# Patient Record
Sex: Male | Born: 1945 | Race: White | Hispanic: No | Marital: Married | Smoking: Current every day smoker
Health system: Southern US, Community
[De-identification: ages and names within clinical notes are randomized; demographics above are authoritative.]

## PROBLEM LIST (undated history)

## (undated) DIAGNOSIS — Z95810 Presence of automatic (implantable) cardiac defibrillator: Secondary | ICD-10-CM

## (undated) DIAGNOSIS — I219 Acute myocardial infarction, unspecified: Secondary | ICD-10-CM

## (undated) DIAGNOSIS — E119 Type 2 diabetes mellitus without complications: Secondary | ICD-10-CM

## (undated) DIAGNOSIS — R053 Chronic cough: Secondary | ICD-10-CM

## (undated) DIAGNOSIS — K863 Pseudocyst of pancreas: Secondary | ICD-10-CM

## (undated) DIAGNOSIS — J449 Chronic obstructive pulmonary disease, unspecified: Secondary | ICD-10-CM

## (undated) DIAGNOSIS — I2510 Atherosclerotic heart disease of native coronary artery without angina pectoris: Secondary | ICD-10-CM

## (undated) DIAGNOSIS — I10 Essential (primary) hypertension: Secondary | ICD-10-CM

## (undated) DIAGNOSIS — E871 Hypo-osmolality and hyponatremia: Secondary | ICD-10-CM

## (undated) DIAGNOSIS — M7989 Other specified soft tissue disorders: Secondary | ICD-10-CM

## (undated) DIAGNOSIS — J189 Pneumonia, unspecified organism: Secondary | ICD-10-CM

## (undated) DIAGNOSIS — I509 Heart failure, unspecified: Secondary | ICD-10-CM

## (undated) DIAGNOSIS — I499 Cardiac arrhythmia, unspecified: Secondary | ICD-10-CM

## (undated) DIAGNOSIS — N189 Chronic kidney disease, unspecified: Secondary | ICD-10-CM

## (undated) DIAGNOSIS — Z950 Presence of cardiac pacemaker: Secondary | ICD-10-CM

## (undated) DIAGNOSIS — I209 Angina pectoris, unspecified: Secondary | ICD-10-CM

## (undated) DIAGNOSIS — E7800 Pure hypercholesterolemia, unspecified: Secondary | ICD-10-CM

## (undated) DIAGNOSIS — K219 Gastro-esophageal reflux disease without esophagitis: Secondary | ICD-10-CM

## (undated) DIAGNOSIS — R05 Cough: Secondary | ICD-10-CM

## (undated) DIAGNOSIS — K759 Inflammatory liver disease, unspecified: Secondary | ICD-10-CM

## (undated) HISTORY — PX: CORONARY ARTERY BYPASS GRAFT: SHX141

## (undated) HISTORY — PX: STENT PLACEMENT NON-VASCULAR (ARMC HX): HXRAD1736

## (undated) HISTORY — PX: CARDIAC CATHETERIZATION: SHX172

## (undated) HISTORY — PX: ERCP W/ SPHICTEROTOMY: SHX1523

---

## 1991-08-16 DIAGNOSIS — I219 Acute myocardial infarction, unspecified: Secondary | ICD-10-CM

## 1991-08-16 HISTORY — DX: Acute myocardial infarction, unspecified: I21.9

## 2005-07-31 ENCOUNTER — Emergency Department: Payer: Self-pay | Admitting: Emergency Medicine

## 2005-08-03 ENCOUNTER — Other Ambulatory Visit: Payer: Self-pay

## 2005-08-03 ENCOUNTER — Ambulatory Visit: Payer: Self-pay | Admitting: Emergency Medicine

## 2005-08-03 ENCOUNTER — Emergency Department: Payer: Self-pay | Admitting: Internal Medicine

## 2006-02-28 ENCOUNTER — Inpatient Hospital Stay: Payer: Self-pay | Admitting: Cardiology

## 2006-02-28 ENCOUNTER — Other Ambulatory Visit: Payer: Self-pay

## 2006-03-01 ENCOUNTER — Other Ambulatory Visit: Payer: Self-pay

## 2006-03-02 ENCOUNTER — Other Ambulatory Visit: Payer: Self-pay

## 2006-03-28 ENCOUNTER — Ambulatory Visit: Payer: Self-pay | Admitting: Family Medicine

## 2006-08-29 ENCOUNTER — Emergency Department: Payer: Self-pay | Admitting: Unknown Physician Specialty

## 2008-03-24 IMAGING — CR DG CHEST 1V PORT
1 series · 1 of 1 positions shown · non-contrast
Comparison: none

REASON FOR EXAM: chest pain
COMMENTS:

[view not recorded]
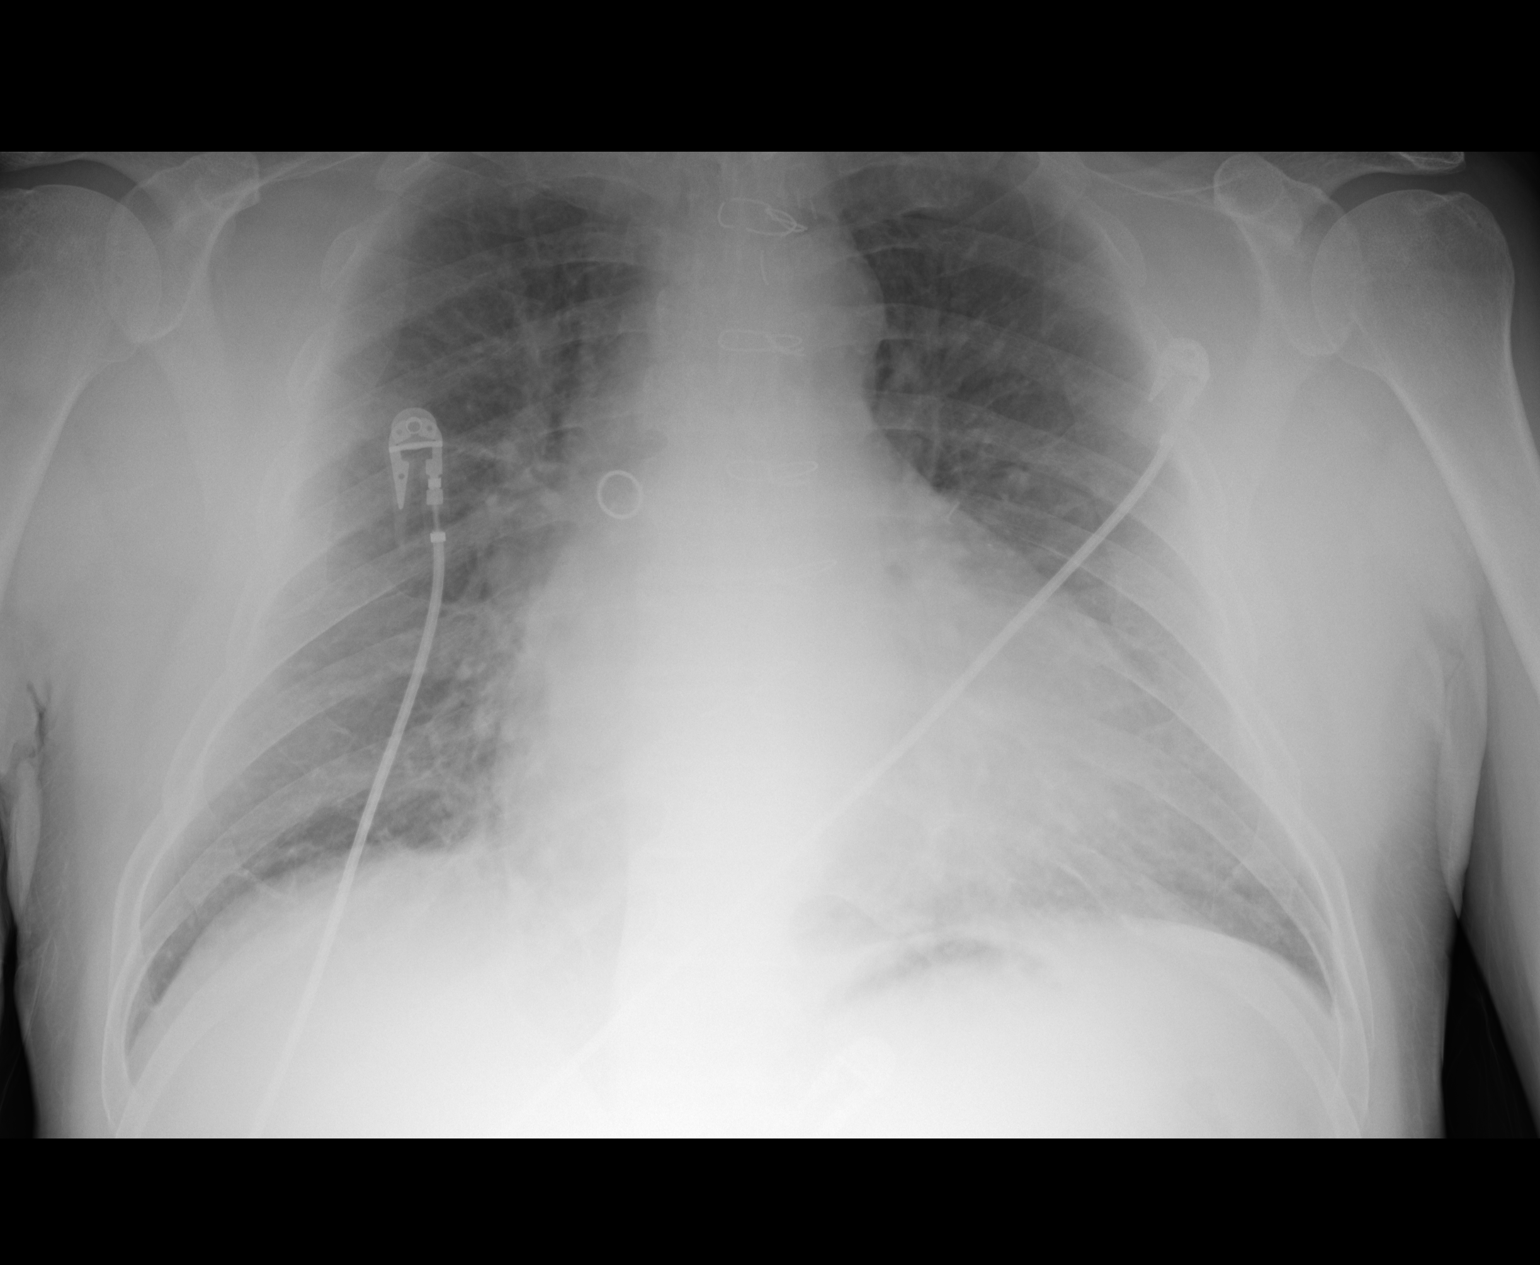

[1 of 1 positions shown; findings below may reference images not displayed]

PROCEDURE:     DXR - DXR PORTABLE CHEST SINGLE VIEW  - February 28, 2006  [DATE]

RESULT:     Comparison is made to prior study dated 08-01-05.

The patient has taken a shallow inspiration.  There is thickening and
indistinctness of the interstitial markings as well as peribronchial
cuffing. No focal regions of consolidation are identified. The cardiac
silhouette is enlarged.  The patient is status post median sternotomy and
coronary artery bypass grafting. The visualized bony skeleton is
unremarkable.
IMPRESSION: 1)Cardiomegaly.

2)Findings consistent with pulmonary edema.

## 2008-08-15 DIAGNOSIS — Z95810 Presence of automatic (implantable) cardiac defibrillator: Secondary | ICD-10-CM

## 2008-08-15 HISTORY — PX: CARDIAC DEFIBRILLATOR PLACEMENT: SHX171

## 2008-08-15 HISTORY — DX: Presence of automatic (implantable) cardiac defibrillator: Z95.810

## 2011-12-20 ENCOUNTER — Inpatient Hospital Stay: Payer: Self-pay | Admitting: Internal Medicine

## 2011-12-20 LAB — CBC
HGB: 13.7 g/dL (ref 13.0–18.0)
MCH: 30.5 pg (ref 26.0–34.0)
MCV: 88 fL (ref 80–100)
Platelet: 223 10*3/uL (ref 150–440)
RBC: 4.51 10*6/uL (ref 4.40–5.90)

## 2011-12-20 LAB — COMPREHENSIVE METABOLIC PANEL
Albumin: 3.9 g/dL (ref 3.4–5.0)
Anion Gap: 10 (ref 7–16)
BUN: 20 mg/dL — ABNORMAL HIGH (ref 7–18)
Bilirubin,Total: 0.3 mg/dL (ref 0.2–1.0)
Calcium, Total: 9 mg/dL (ref 8.5–10.1)
Chloride: 106 mmol/L (ref 98–107)
Creatinine: 1.58 mg/dL — ABNORMAL HIGH (ref 0.60–1.30)
EGFR (African American): 52 — ABNORMAL LOW
Osmolality: 288 (ref 275–301)
Potassium: 4.3 mmol/L (ref 3.5–5.1)
SGOT(AST): 18 U/L (ref 15–37)
Total Protein: 7.6 g/dL (ref 6.4–8.2)

## 2011-12-20 LAB — APTT: Activated PTT: 30.7 secs (ref 23.6–35.9)

## 2011-12-20 LAB — TROPONIN I: Troponin-I: 0.08 ng/mL — ABNORMAL HIGH

## 2011-12-21 LAB — MAGNESIUM: Magnesium: 0.9 mg/dL — ABNORMAL LOW

## 2012-03-25 ENCOUNTER — Emergency Department: Payer: Self-pay | Admitting: Emergency Medicine

## 2012-08-15 HISTORY — PX: OTHER SURGICAL HISTORY: SHX169

## 2012-08-15 HISTORY — PX: PANCREATIC CYST EXCISION: SHX2157

## 2012-08-15 HISTORY — PX: GASTROSTOMY W/ FEEDING TUBE: SUR642

## 2012-08-15 HISTORY — PX: CHOLECYSTECTOMY: SHX55

## 2012-11-14 ENCOUNTER — Ambulatory Visit: Payer: Self-pay | Admitting: Family Medicine

## 2013-01-02 ENCOUNTER — Ambulatory Visit: Payer: Self-pay | Admitting: Family Medicine

## 2013-01-07 ENCOUNTER — Inpatient Hospital Stay: Payer: Self-pay | Admitting: Internal Medicine

## 2013-01-07 LAB — COMPREHENSIVE METABOLIC PANEL
Albumin: 4 g/dL (ref 3.4–5.0)
BUN: 13 mg/dL (ref 7–18)
Chloride: 105 mmol/L (ref 98–107)
Co2: 22 mmol/L (ref 21–32)
Creatinine: 1.54 mg/dL — ABNORMAL HIGH (ref 0.60–1.30)
EGFR (African American): 53 — ABNORMAL LOW
Potassium: 3.6 mmol/L (ref 3.5–5.1)
SGPT (ALT): 134 U/L — ABNORMAL HIGH (ref 12–78)
Sodium: 136 mmol/L (ref 136–145)
Total Protein: 8.8 g/dL — ABNORMAL HIGH (ref 6.4–8.2)

## 2013-01-07 LAB — BILIRUBIN, DIRECT: Bilirubin, Direct: 10.9 mg/dL — ABNORMAL HIGH (ref 0.00–0.20)

## 2013-01-07 LAB — CBC WITH DIFFERENTIAL/PLATELET
HCT: 41.1 % (ref 40.0–52.0)
Lymphocytes: 28 %
MCH: 29.2 pg (ref 26.0–34.0)
MCV: 88 fL (ref 80–100)
Platelet: 230 10*3/uL (ref 150–440)
RDW: 17.1 % — ABNORMAL HIGH (ref 11.5–14.5)
WBC: 8.1 10*3/uL (ref 3.8–10.6)

## 2013-01-07 LAB — URINALYSIS, COMPLETE
Blood: NEGATIVE
Ph: 5 (ref 4.5–8.0)
Protein: NEGATIVE
RBC,UR: 1 /HPF (ref 0–5)
Specific Gravity: 1.011 (ref 1.003–1.030)
Squamous Epithelial: <1
WBC UR: 1 /HPF (ref 0–5)

## 2013-01-08 LAB — CBC WITH DIFFERENTIAL/PLATELET
Comment - H1-Com1: NORMAL
Comment - H1-Com2: NORMAL
Eosinophil: 7 %
HCT: 35.4 % — ABNORMAL LOW (ref 40.0–52.0)
HGB: 12.3 g/dL — ABNORMAL LOW (ref 13.0–18.0)
Lymphocytes: 22 %
MCH: 29.9 pg (ref 26.0–34.0)
MCV: 86 fL (ref 80–100)
Platelet: 198 10*3/uL (ref 150–440)
RBC: 4.11 10*6/uL — ABNORMAL LOW (ref 4.40–5.90)
Segmented Neutrophils: 58 %

## 2013-01-08 LAB — HEPATIC FUNCTION PANEL A (ARMC)
Albumin: 3 g/dL — ABNORMAL LOW (ref 3.4–5.0)
Alkaline Phosphatase: 705 U/L — ABNORMAL HIGH (ref 50–136)
Bilirubin, Direct: 10.2 mg/dL — ABNORMAL HIGH (ref 0.00–0.20)
Bilirubin,Total: 13 mg/dL — ABNORMAL HIGH (ref 0.2–1.0)
SGPT (ALT): 92 U/L — ABNORMAL HIGH (ref 12–78)
Total Protein: 6.8 g/dL (ref 6.4–8.2)

## 2013-01-08 LAB — BASIC METABOLIC PANEL
Anion Gap: 11 (ref 7–16)
Calcium, Total: 9.2 mg/dL (ref 8.5–10.1)
Chloride: 107 mmol/L (ref 98–107)
Co2: 21 mmol/L (ref 21–32)
EGFR (African American): 48 — ABNORMAL LOW
EGFR (Non-African Amer.): 41 — ABNORMAL LOW
Sodium: 139 mmol/L (ref 136–145)

## 2013-01-09 LAB — HEPATIC FUNCTION PANEL A (ARMC)
Alkaline Phosphatase: 674 U/L — ABNORMAL HIGH (ref 50–136)
Bilirubin, Direct: 10 mg/dL — ABNORMAL HIGH (ref 0.00–0.20)
Bilirubin,Total: 12.8 mg/dL — ABNORMAL HIGH (ref 0.2–1.0)
SGOT(AST): 67 U/L — ABNORMAL HIGH (ref 15–37)

## 2013-01-09 LAB — IRON AND TIBC
Iron Bind.Cap.(Total): 316 ug/dL (ref 250–450)
Iron: 134 ug/dL (ref 65–175)

## 2013-01-10 LAB — COMPREHENSIVE METABOLIC PANEL
Anion Gap: 7 (ref 7–16)
Bilirubin,Total: 12.4 mg/dL — ABNORMAL HIGH (ref 0.2–1.0)
Calcium, Total: 9.2 mg/dL (ref 8.5–10.1)
Creatinine: 1.44 mg/dL — ABNORMAL HIGH (ref 0.60–1.30)
Osmolality: 278 (ref 275–301)
Potassium: 3.8 mmol/L (ref 3.5–5.1)
SGOT(AST): 65 U/L — ABNORMAL HIGH (ref 15–37)
Sodium: 135 mmol/L — ABNORMAL LOW (ref 136–145)
Total Protein: 6.7 g/dL (ref 6.4–8.2)

## 2013-01-11 LAB — COMPREHENSIVE METABOLIC PANEL
Alkaline Phosphatase: 637 U/L — ABNORMAL HIGH (ref 50–136)
Anion Gap: 7 (ref 7–16)
Chloride: 105 mmol/L (ref 98–107)
EGFR (African American): 54 — ABNORMAL LOW
Glucose: 162 mg/dL — ABNORMAL HIGH (ref 65–99)
Osmolality: 277 (ref 275–301)
Potassium: 3.9 mmol/L (ref 3.5–5.1)
SGPT (ALT): 72 U/L (ref 12–78)
Sodium: 134 mmol/L — ABNORMAL LOW (ref 136–145)
Total Protein: 7.3 g/dL (ref 6.4–8.2)

## 2013-01-12 LAB — COMPREHENSIVE METABOLIC PANEL
Alkaline Phosphatase: 698 U/L — ABNORMAL HIGH (ref 50–136)
BUN: 32 mg/dL — ABNORMAL HIGH (ref 7–18)
Chloride: 101 mmol/L (ref 98–107)
Glucose: 160 mg/dL — ABNORMAL HIGH (ref 65–99)
Osmolality: 277 (ref 275–301)
Potassium: 4 mmol/L (ref 3.5–5.1)
SGOT(AST): 72 U/L — ABNORMAL HIGH (ref 15–37)
Total Protein: 7.3 g/dL (ref 6.4–8.2)

## 2013-01-13 LAB — COMPREHENSIVE METABOLIC PANEL
Albumin: 3 g/dL — ABNORMAL LOW (ref 3.4–5.0)
Alkaline Phosphatase: 629 U/L — ABNORMAL HIGH (ref 50–136)
Anion Gap: 9 (ref 7–16)
BUN: 33 mg/dL — ABNORMAL HIGH (ref 7–18)
Bilirubin,Total: 14.5 mg/dL — ABNORMAL HIGH (ref 0.2–1.0)
Co2: 19 mmol/L — ABNORMAL LOW (ref 21–32)
Creatinine: 1.5 mg/dL — ABNORMAL HIGH (ref 0.60–1.30)
EGFR (Non-African Amer.): 47 — ABNORMAL LOW
Glucose: 142 mg/dL — ABNORMAL HIGH (ref 65–99)
Potassium: 4.2 mmol/L (ref 3.5–5.1)
SGOT(AST): 72 U/L — ABNORMAL HIGH (ref 15–37)

## 2013-01-13 LAB — CBC WITH DIFFERENTIAL/PLATELET
Basophil: 3 %
HCT: 36.2 % — ABNORMAL LOW (ref 40.0–52.0)
HGB: 12.5 g/dL — ABNORMAL LOW (ref 13.0–18.0)
MCH: 29.6 pg (ref 26.0–34.0)
MCHC: 34.5 g/dL (ref 32.0–36.0)
Monocytes: 9 %
Platelet: 232 10*3/uL (ref 150–440)
RBC: 4.21 10*6/uL — ABNORMAL LOW (ref 4.40–5.90)
RDW: 18.2 % — ABNORMAL HIGH (ref 11.5–14.5)
Segmented Neutrophils: 61 %

## 2013-01-15 LAB — COMPREHENSIVE METABOLIC PANEL
Albumin: 3.1 g/dL — ABNORMAL LOW (ref 3.4–5.0)
Alkaline Phosphatase: 671 U/L — ABNORMAL HIGH (ref 50–136)
Anion Gap: 9 (ref 7–16)
Bilirubin,Total: 18 mg/dL — ABNORMAL HIGH (ref 0.2–1.0)
Calcium, Total: 9.6 mg/dL (ref 8.5–10.1)
Chloride: 103 mmol/L (ref 98–107)
EGFR (African American): 47 — ABNORMAL LOW
Glucose: 255 mg/dL — ABNORMAL HIGH (ref 65–99)
Sodium: 133 mmol/L — ABNORMAL LOW (ref 136–145)
Total Protein: 7.3 g/dL (ref 6.4–8.2)

## 2013-01-15 LAB — BASIC METABOLIC PANEL
Anion Gap: 8 (ref 7–16)
Calcium, Total: 9.4 mg/dL (ref 8.5–10.1)
Chloride: 105 mmol/L (ref 98–107)
Creatinine: 1.45 mg/dL — ABNORMAL HIGH (ref 0.60–1.30)
EGFR (African American): 57 — ABNORMAL LOW
EGFR (Non-African Amer.): 49 — ABNORMAL LOW
Glucose: 266 mg/dL — ABNORMAL HIGH (ref 65–99)
Osmolality: 286 (ref 275–301)
Potassium: 4.1 mmol/L (ref 3.5–5.1)
Sodium: 135 mmol/L — ABNORMAL LOW (ref 136–145)

## 2013-01-15 LAB — HEMOGLOBIN: HGB: 12.9 g/dL — ABNORMAL LOW (ref 13.0–18.0)

## 2013-01-16 LAB — COMPREHENSIVE METABOLIC PANEL
Alkaline Phosphatase: 545 U/L — ABNORMAL HIGH (ref 50–136)
BUN: 36 mg/dL — ABNORMAL HIGH (ref 7–18)
Calcium, Total: 9.3 mg/dL (ref 8.5–10.1)
Chloride: 106 mmol/L (ref 98–107)
Co2: 21 mmol/L (ref 21–32)
EGFR (African American): 49 — ABNORMAL LOW
SGPT (ALT): 57 U/L (ref 12–78)
Total Protein: 6.7 g/dL (ref 6.4–8.2)

## 2013-01-17 LAB — CBC WITH DIFFERENTIAL/PLATELET
Bands: 5 %
HCT: 34.7 % — ABNORMAL LOW (ref 40.0–52.0)
Lymphocytes: 5 %
MCH: 29.4 pg (ref 26.0–34.0)
MCHC: 34.3 g/dL (ref 32.0–36.0)
MCV: 86 fL (ref 80–100)
Platelet: 274 10*3/uL (ref 150–440)
Segmented Neutrophils: 85 %
WBC: 14 10*3/uL — ABNORMAL HIGH (ref 3.8–10.6)

## 2013-01-17 LAB — COMPREHENSIVE METABOLIC PANEL
Albumin: 2.2 g/dL — ABNORMAL LOW (ref 3.4–5.0)
BUN: 38 mg/dL — ABNORMAL HIGH (ref 7–18)
Bilirubin,Total: 19.7 mg/dL — ABNORMAL HIGH (ref 0.2–1.0)
Creatinine: 1.61 mg/dL — ABNORMAL HIGH (ref 0.60–1.30)
EGFR (Non-African Amer.): 43 — ABNORMAL LOW
Glucose: 167 mg/dL — ABNORMAL HIGH (ref 65–99)
Potassium: 3.9 mmol/L (ref 3.5–5.1)
SGOT(AST): 59 U/L — ABNORMAL HIGH (ref 15–37)
SGPT (ALT): 45 U/L (ref 12–78)
Total Protein: 6.2 g/dL — ABNORMAL LOW (ref 6.4–8.2)

## 2013-01-18 LAB — COMPREHENSIVE METABOLIC PANEL
Anion Gap: 10 (ref 7–16)
BUN: 35 mg/dL — ABNORMAL HIGH (ref 7–18)
Bilirubin,Total: 19.1 mg/dL — ABNORMAL HIGH (ref 0.2–1.0)
Calcium, Total: 9.3 mg/dL (ref 8.5–10.1)
Co2: 20 mmol/L — ABNORMAL LOW (ref 21–32)
Creatinine: 1.48 mg/dL — ABNORMAL HIGH (ref 0.60–1.30)
EGFR (African American): 56 — ABNORMAL LOW
EGFR (Non-African Amer.): 48 — ABNORMAL LOW
Glucose: 160 mg/dL — ABNORMAL HIGH (ref 65–99)
Osmolality: 281 (ref 275–301)
Potassium: 3.5 mmol/L (ref 3.5–5.1)
SGOT(AST): 53 U/L — ABNORMAL HIGH (ref 15–37)
SGPT (ALT): 42 U/L (ref 12–78)
Sodium: 135 mmol/L — ABNORMAL LOW (ref 136–145)

## 2013-01-19 LAB — COMPREHENSIVE METABOLIC PANEL WITH GFR
Albumin: 1.8 g/dL — ABNORMAL LOW
Alkaline Phosphatase: 369 U/L — ABNORMAL HIGH
Anion Gap: 10
BUN: 29 mg/dL — ABNORMAL HIGH
Bilirubin,Total: 19 mg/dL — ABNORMAL HIGH
Calcium, Total: 8.7 mg/dL
Chloride: 104 mmol/L
Co2: 22 mmol/L
Creatinine: 1.26 mg/dL
EGFR (African American): >60
EGFR (Non-African Amer.): 58 — ABNORMAL LOW
Glucose: 119 mg/dL — ABNORMAL HIGH
Osmolality: 279
Potassium: 3.6 mmol/L
SGOT(AST): 57 U/L — ABNORMAL HIGH
SGPT (ALT): 36 U/L
Sodium: 136 mmol/L
Total Protein: 5.9 g/dL — ABNORMAL LOW

## 2013-01-20 LAB — COMPREHENSIVE METABOLIC PANEL
Albumin: 2 g/dL — ABNORMAL LOW (ref 3.4–5.0)
Alkaline Phosphatase: 331 U/L — ABNORMAL HIGH (ref 50–136)
Anion Gap: 12 (ref 7–16)
Co2: 19 mmol/L — ABNORMAL LOW (ref 21–32)
EGFR (Non-African Amer.): 54 — ABNORMAL LOW
Glucose: 147 mg/dL — ABNORMAL HIGH (ref 65–99)
Osmolality: 272 (ref 275–301)
Potassium: 3.3 mmol/L — ABNORMAL LOW (ref 3.5–5.1)
SGPT (ALT): 33 U/L (ref 12–78)
Sodium: 132 mmol/L — ABNORMAL LOW (ref 136–145)
Total Protein: 5.8 g/dL — ABNORMAL LOW (ref 6.4–8.2)

## 2013-05-25 ENCOUNTER — Emergency Department: Payer: Self-pay | Admitting: Emergency Medicine

## 2013-06-04 ENCOUNTER — Emergency Department: Payer: Self-pay | Admitting: Emergency Medicine

## 2013-06-04 LAB — URINALYSIS, COMPLETE
Bacteria: NONE SEEN
Blood: NEGATIVE
Glucose,UR: NEGATIVE mg/dL (ref 0–75)
Ketone: NEGATIVE
Ph: 5 (ref 4.5–8.0)
Protein: NEGATIVE
Squamous Epithelial: NONE SEEN
WBC UR: 25 /HPF (ref 0–5)

## 2013-06-04 LAB — TROPONIN I: Troponin-I: 0.04 ng/mL

## 2013-06-04 LAB — COMPREHENSIVE METABOLIC PANEL
Alkaline Phosphatase: 109 U/L (ref 50–136)
Anion Gap: 5 — ABNORMAL LOW (ref 7–16)
BUN: 15 mg/dL (ref 7–18)
Bilirubin,Total: 0.3 mg/dL (ref 0.2–1.0)
Chloride: 102 mmol/L (ref 98–107)
Co2: 26 mmol/L (ref 21–32)
EGFR (Non-African Amer.): >60
Potassium: 4.4 mmol/L (ref 3.5–5.1)
SGOT(AST): 25 U/L (ref 15–37)
SGPT (ALT): 13 U/L (ref 12–78)

## 2013-06-04 LAB — CBC
HGB: 11.3 g/dL — ABNORMAL LOW (ref 13.0–18.0)
MCH: 29.6 pg (ref 26.0–34.0)
MCHC: 34.1 g/dL (ref 32.0–36.0)
MCV: 87 fL (ref 80–100)
Platelet: 197 10*3/uL (ref 150–440)
RBC: 3.83 10*6/uL — ABNORMAL LOW (ref 4.40–5.90)
WBC: 11.4 10*3/uL — ABNORMAL HIGH (ref 3.8–10.6)

## 2013-06-04 LAB — LIPASE, BLOOD: Lipase: 283 U/L (ref 73–393)

## 2013-06-07 LAB — URINE CULTURE

## 2014-03-25 ENCOUNTER — Observation Stay: Payer: Self-pay | Admitting: Internal Medicine

## 2014-03-25 LAB — BASIC METABOLIC PANEL
Anion Gap: 9 (ref 7–16)
BUN: 18 mg/dL (ref 7–18)
CHLORIDE: 106 mmol/L (ref 98–107)
CO2: 24 mmol/L (ref 21–32)
Calcium, Total: 8.5 mg/dL (ref 8.5–10.1)
Creatinine: 1.5 mg/dL — ABNORMAL HIGH (ref 0.60–1.30)
EGFR (African American): 54 — ABNORMAL LOW
EGFR (Non-African Amer.): 47 — ABNORMAL LOW
GLUCOSE: 296 mg/dL — AB (ref 65–99)
OSMOLALITY: 290 (ref 275–301)
POTASSIUM: 3.8 mmol/L (ref 3.5–5.1)
SODIUM: 139 mmol/L (ref 136–145)

## 2014-03-25 LAB — TSH: THYROID STIMULATING HORM: 1.23 u[IU]/mL

## 2014-03-25 LAB — CBC
HCT: 35.3 % — ABNORMAL LOW (ref 40.0–52.0)
HGB: 11.6 g/dL — ABNORMAL LOW (ref 13.0–18.0)
MCH: 29.5 pg (ref 26.0–34.0)
MCHC: 33 g/dL (ref 32.0–36.0)
MCV: 90 fL (ref 80–100)
PLATELETS: 141 10*3/uL — AB (ref 150–440)
RBC: 3.95 10*6/uL — ABNORMAL LOW (ref 4.40–5.90)
RDW: 16.4 % — AB (ref 11.5–14.5)
WBC: 6.2 10*3/uL (ref 3.8–10.6)

## 2014-03-25 LAB — TROPONIN I
TROPONIN-I: 0.11 ng/mL — AB
TROPONIN-I: 0.11 ng/mL — AB

## 2014-03-25 LAB — PROTIME-INR
INR: 1
Prothrombin Time: 13.4 secs (ref 11.5–14.7)

## 2014-03-25 LAB — PRO B NATRIURETIC PEPTIDE: B-TYPE NATIURETIC PEPTID: 1358 pg/mL — AB (ref 0–125)

## 2014-03-26 LAB — BASIC METABOLIC PANEL
Anion Gap: 6 — ABNORMAL LOW (ref 7–16)
BUN: 17 mg/dL (ref 7–18)
CALCIUM: 8.8 mg/dL (ref 8.5–10.1)
CREATININE: 1.36 mg/dL — AB (ref 0.60–1.30)
Chloride: 110 mmol/L — ABNORMAL HIGH (ref 98–107)
Co2: 26 mmol/L (ref 21–32)
EGFR (African American): >60
GFR CALC NON AF AMER: 53 — AB
Glucose: 159 mg/dL — ABNORMAL HIGH (ref 65–99)
Osmolality: 288 (ref 275–301)
Potassium: 3.7 mmol/L (ref 3.5–5.1)
SODIUM: 142 mmol/L (ref 136–145)

## 2014-03-26 LAB — TROPONIN I: Troponin-I: 0.11 ng/mL — ABNORMAL HIGH

## 2014-07-29 ENCOUNTER — Ambulatory Visit: Payer: Self-pay | Admitting: Family Medicine

## 2014-10-28 ENCOUNTER — Emergency Department: Payer: Self-pay | Admitting: Emergency Medicine

## 2014-12-05 NOTE — Consult Note (Signed)
Chief Complaint:  Subjective/Chief Complaint Asked by Dr. Mechele Collin to see patient for possible ERCP. Pt without GI sxs. Is jaundiced. Last dose of plavix yest AM.   VITAL SIGNS/ANCILLARY NOTES: **Vital Signs.:   28-May-14 13:24  Vital Signs Type Routine  Temperature Temperature (F) 98  Celsius 36.6  Temperature Source oral  Pulse Pulse 89  Respirations Respirations 18  Systolic BP Systolic BP 112  Diastolic BP (mmHg) Diastolic BP (mmHg) 71  Mean BP 84  Pulse Ox % Pulse Ox % 96  Pulse Ox Activity Level  At rest  Oxygen Delivery Room Air/ 21 %   Brief Assessment:  Cardiac Regular   Respiratory clear BS   Gastrointestinal Normal   Lab Results: Hepatic:  28-May-14 04:38   Bilirubin, Total  12.8  Bilirubin, Direct  10.0 (Result(s) reported on 09 Jan 2013 at 05:35AM.)  Alkaline Phosphatase  674  SGPT (ALT)  80  SGOT (AST)  67  Total Protein, Serum 7.0  Albumin, Serum  2.9  Routine Chem:  28-May-14 04:38   Ferritin ([facility]) 243 (Result(s) reported on 09 Jan 2013 at 05:35AM.)  Iron Binding Capacity (TIBC) 316 (Result(s) reported on 09 Jan 2013 at 06:34AM.)  Iron, Serum 134  Cardiac:  28-May-14 04:38   CK, Total 38 (Result(s) reported on 09 Jan 2013 at 08:49AM.)   Radiology Results: CT:    27-May-14 09:02, CT Abdomen With Contrast  CT Abdomen With Contrast   REASON FOR EXAM:    jaundice, elevated LFT's;    NOTE: Nursing to Give   Oral CT Contrast  COMMENTS:       PROCEDURE: CT  - CT ABDOMEN STANDARD W  - Jan 08 2013  9:02AM     RESULT: Abdominal CT is performed with 80 mL of Isovue-300 iodinated   intravenous contrast and oral contrast with images reconstructed at 3.0   mm slice thickness in the axial plane. There is no previous exam for   comparison.    Images through the base the lungs demonstrate cardiomegaly. No focal   pulmonary mass is appreciated. No pleural or pericardial effusion is   evident. Pacemaker leads are present. The liver shows no discrete mass  or   ductal dilation. Radiopaque stones are seen in the dependent portion of     the gallbladder. There is no adjacent inflammatory stranding area the   spleen appears to be unremarkable. There is a small accessory spleen   along the inferior margin with a second tiny nodular density inferior to   the splenic hilum near the pancreatic tail which is likely another area   of splenic tissue. The kidneys show no mass or obstruction. The adrenal   glands appear unremarkable. The pancreas appears within normal limits.   There is mild aneurysmal dilation in the distal infrarenal abdominal   aorta showing an anterior-posterior dimension of 2.75 cm with a   transverse dimension of 3.19 cm on image 98. There is a left common iliac   artery aneurysm measuring 2.22 cm anterior to posterior on image 108.   There is no abnormal bowel distention or bowel wall thickening. Stomach   contains a moderate amount of contrast and other material and appears   unremarkable. No adenopathy is evident. Bony structures appear   unremarkable with some degenerative changes present in the spine.  IMPRESSION:   1. No focal hepatic abnormality.  2. Cholelithiasis.  3. 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  Dictation Site: 1        Verified By: Elveria RoyalsGEOFFREY H. BROWNE, M.D., MD   Assessment/Plan:  Assessment/Plan:  Assessment Jaundice. CT neg. Not convinced this is biliary in origin. May well be hepatocellular disease, from augmentin use?   Plan Since patient took plavix yest, patient not amenable to having ERCP until early next week anyway. If this is hepatocellular disease, hopefully LFT will improve over next several days. THanks.   Electronic Signatures: Lutricia Feilh, Jowel Waltner (MD)  (Signed 28-May-14 14:29)  Authored: Chief Complaint, VITAL SIGNS/ANCILLARY NOTES, Brief Assessment, Lab Results, Radiology Results, Assessment/Plan   Last Updated: 28-May-14 14:29 by Lutricia Feilh, Quadarius Henton (MD)

## 2014-12-05 NOTE — Consult Note (Signed)
Chief Complaint:  Subjective/Chief Complaint Covering for Dr. Vira Agar. LFT getting worse. Diffuse pruritis. Benadryl not working. Last plavix dose on Tues.   VITAL SIGNS/ANCILLARY NOTES: **Vital Signs.:   31-May-14 05:15  Vital Signs Type Routine  Temperature Temperature (F) 97.8  Celsius 36.5  Temperature Source oral  Pulse Pulse 86  Respirations Respirations 20  Systolic BP Systolic BP 795  Diastolic BP (mmHg) Diastolic BP (mmHg) 65  Mean BP 77  Pulse Ox % Pulse Ox % 96  Pulse Ox Activity Level  At rest  Oxygen Delivery Room Air/ 21 %   Brief Assessment:  GEN no acute distress   Cardiac Regular   Respiratory clear BS   Gastrointestinal Normal   Additional Physical Exam Deep jaundice.   Lab Results: Hepatic:  31-May-14 05:52   Bilirubin, Total  14.5  Alkaline Phosphatase  698  SGPT (ALT) 72  SGOT (AST)  72  Total Protein, Serum 7.3  Albumin, Serum  3.0  Routine Chem:  31-May-14 05:52   Glucose, Serum  160  BUN  32  Creatinine (comp)  1.51  Sodium, Serum  133  Potassium, Serum 4.0  Chloride, Serum 101  CO2, Serum 22  Calcium (Total), Serum 9.6  Osmolality (calc) 277  eGFR (African American)  54  eGFR (Non-African American)  47 (eGFR values <41m/min/1.73 m2 may be an indication of chronic kidney disease (CKD). Calculated eGFR is useful in patients with stable renal function. The eGFR calculation will not be reliable in acutely ill patients when serum creatinine is changing rapidly. It is not useful in  patients on dialysis. The eGFR calculation may not be applicable to patients at the low and high extremes of body sizes, pregnant women, and vegetarians.)  Anion Gap 10  Routine Hem:  31-May-14 05:52   Platelet Count (CBC) 249 (Result(s) reported on 12 Jan 2013 at 06:26AM.)   Assessment/Plan:  Assessment/Plan:  Assessment Jaundice. No improvement.   Plan Plan ERCP on Monday afternoon. Start atarax for pruritis.   Electronic Signatures: OVerdie Shire (MD)  (Signed 31-May-14 10:03)  Authored: Chief Complaint, VITAL SIGNS/ANCILLARY NOTES, Brief Assessment, Lab Results, Assessment/Plan   Last Updated: 31-May-14 10:03 by OVerdie Shire(MD)

## 2014-12-05 NOTE — Consult Note (Signed)
CC: jaundice.  Pt has gall stones.  The cardiology note from today was reviewed.  They gave approval for ERCP and or gall bladder removal from their stand point.   I discussed case with Dr. Candace Cruise who will see patient today for possible ERCP.  VSS afebrile, chest clear, slight epigastric tenderness, TB 12.8, DB 10, alk phos 674, iron studies normal. CA 19-9 was 94, a non specific range.  Electronic Signatures: Manya Silvas (MD)  (Signed on 28-May-14 11:24)  Authored  Last Updated: 28-May-14 11:24 by Manya Silvas (MD)

## 2014-12-05 NOTE — Consult Note (Signed)
PATIENT NAME:  [name], [name] MR#:  [id number] DATE OF BIRTH:  [birth year]  DATE OF CONSULTATION:  01/07/2013  REFERRING PHYSICIAN:   CONSULTING PHYSICIAN:  Carmie Endalph L. Ely III, MD  PRIMARY CARE PHYSICIAN:  Charlann BoxerE. B. White, MD  ADMITTING PHYSICIAN: [facility] Doc.   CHIEF COMPLAINT: Jaundice and itching.  BRIEF HISTORY:  [name] is a 69 year old gentleman admitted to the hospital with episode of significant itching and jaundice. He was in his usual state of good health over the last several months. He began to develop some generalized itching with a change in his urine and moderate jaundice. He was noted to have a bilirubin of 14 in the Emergency Room. He did not have any evidence of biliary obstruction at the time of workup in the Emergency Room.  He did have an ultrasound of the gallbladder which demonstrated cholelithiasis. He has had no previous similar problems. He has no history of hepatitis, pancreatitis, peptic ulcer disease, gallbladder disease, or diverticulitis. He has no previous abdominal surgery. He denies any other GI symptoms. He has had no nausea, vomiting, diarrhea, or change in his bowel habits. He is a long-standing cigarette smoker and continues to smoke. He drinks alcohol regularly, but not to excess.   PAST MEDICAL HISTORY: Significant for severe ischemic cardiomyopathy with myocardial infarction in 2013. He has had stents placed in the past and 2 previous cardiac bypass procedures. He has an implanted defibrillator and a pacemaker. He has also had carotid stent surgery and is currently on Plavix and aspirin.   MEDICATIONS: Are otherwise outlined in his admission note.  REVIEW OF SYSTEMS:  Otherwise unremarkable.   PHYSICAL EXAMINATION: GENERAL: He is lying comfortably in bed with no complaints of pain. Denies any significant abdominal discomfort.  VITAL SIGNS: Blood pressure is 130/78.  Heart rate is 84 and regular. He is afebrile.  HEENT: Reveals significant scleral icterus,  some mild facial jaundice. He has no pupillary abnormalities and no facial deformities.  NECK: Supple without adenopathy and tenderness. He has a midline trachea.  LUNGS:  Chest is clear with no adventitious sounds and normal pulmonary excursion.  HEART:  No murmurs or gallops. He seems to be in normal sinus rhythm.  ABDOMEN:  Mildly distended, nontender with no organomegaly. No rebound. No guarding. No point tenderness.  LOWER EXTREMITIES: Full range of motion, no deformities, and trace distal pulses.  PSYCHIATRIC: Normal orientation, normal affect.   ASSESSMENT AND PLAN: This gentleman has painless jaundice with evidence for biliary tract disease, but no evidence of biliary duct obstruction on imaging. He would need further imaging with CT scan, MRCP and ERCP as indicated. Initial workup of course would suggest that he has possible pancreatic obstruction. I will continue to treat him with support and IV antibiotics to avoid cholangitis and serial examinations as we await the imaging results. I do not see any surgical indications at this time. He would be a difficult surgical candidate with his antiplatelet therapy and his severe cardiomyopathy. I will continue to follow him while he is hospitalized.  ____________________________ Quentin Orealph L. Ely III, MD rle:sb D: 01/07/2013 10:26:12 ET T: 01/07/2013 10:35:18 ET JOB#: [id number]  cc: Quentin Orealph L. Ely III, MD, <Dictator> Quentin OreALPH L ELY MD ELECTRONICALLY SIGNED 01/12/2013 9:46

## 2014-12-05 NOTE — Consult Note (Signed)
Pt CC is jaundice.  No evidence of duct obst on [place].  TB elevated to 19, some edema in duodenum.  Last US showed non mobile stones without dilatation.  Not candidate for MRCP, given non dilated ducts PTC risky with higher failure rate.  Gall bladder wall thickness decreased.  IF this is drug induced cholestatic jaundice it sometimes can take weeks or months to resolve.  If no signif clinical deterioration and no further diagnostic tests planned he could go home or to rehab and follow from there.  Pt reports no new pain and no peritoneal signs.  Electronic Signatures: Manya Silvas (MD)  (Signed on 07-Jun-14 11:00)  Authored  Last Updated: 07-Jun-14 11:00 by Manya Silvas (MD)

## 2014-12-05 NOTE — Consult Note (Signed)
Chief Complaint:  Subjective/Chief Complaint Itching better with atarax though not completely resolved. Jaundice persists.   VITAL SIGNS/ANCILLARY NOTES: **Vital Signs.:   01-Jun-14 05:38  Vital Signs Type Routine  Temperature Temperature (F) 97.8  Celsius 36.5  Temperature Source oral  Pulse Pulse 89  Respirations Respirations 18  Systolic BP Systolic BP 98  Diastolic BP (mmHg) Diastolic BP (mmHg) 64  Mean BP 75  Pulse Ox % Pulse Ox % 93  Pulse Ox Activity Level  At rest  Oxygen Delivery Room Air/ 21 %   Brief Assessment:  GEN no acute distress   Cardiac Regular   Respiratory clear BS   Gastrointestinal Normal   Lab Results: Hepatic:  01-Jun-14 05:49   Bilirubin, Total  14.5  Alkaline Phosphatase  629  SGPT (ALT) 69  SGOT (AST)  72  Total Protein, Serum 7.0  Albumin, Serum  3.0  Routine Chem:  01-Jun-14 05:49   BUN  33  Creatinine (comp)  1.50  Sodium, Serum  133  Potassium, Serum 4.2  Chloride, Serum 105  CO2, Serum  19  Calcium (Total), Serum 9.4  Osmolality (calc) 276  eGFR (African American)  55  eGFR (Non-African American)  47 (eGFR values <44m/min/1.73 m2 may be an indication of chronic kidney disease (CKD). Calculated eGFR is useful in patients with stable renal function. The eGFR calculation will not be reliable in acutely ill patients when serum creatinine is changing rapidly. It is not useful in  patients on dialysis. The eGFR calculation may not be applicable to patients at the low and high extremes of body sizes, pregnant women, and vegetarians.)  Anion Gap 9  Routine Hem:  01-Jun-14 05:49   WBC (CBC) 6.9  RBC (CBC)  4.21  Hemoglobin (CBC)  12.5  Hematocrit (CBC)  36.2  Platelet Count (CBC) 232 (Result(s) reported on 13 Jan 2013 at 06:33AM.)  MCV 86  MCH 29.6  MCHC 34.5  RDW  18.2  Segmented Neutrophils 61  Lymphocytes 18  Monocytes 9  Eosinophil 9  Basophil 3  Diff Comment 1 ANISOCYTOSIS  Diff Comment 2 POIKILOCYTOSIS  Diff  Comment 3 POLYCHROMASIA  Diff Comment 4 TARGET CELLS  Diff Comment 5 PLTS VARIED IN SIZE  Result(s) reported on 13 Jan 2013 at 06:33AM.   Assessment/Plan:  Assessment/Plan:  Assessment Jaundice. Persistent.   Plan Will plan for ERCP tomorrow. Discussed procedure in detail as well as potential risks, incl pancreatitis. NPO after MN except meds. Will hold asa in AM. thanks.   Electronic Signatures: OVerdie Shire(MD)  (Signed 01-Jun-14 09:09)  Authored: Chief Complaint, VITAL SIGNS/ANCILLARY NOTES, Brief Assessment, Lab Results, Assessment/Plan   Last Updated: 01-Jun-14 09:09 by OVerdie Shire(MD)

## 2014-12-05 NOTE — Consult Note (Signed)
Chief Complaint:  Subjective/Chief Complaint Patient with jaundice and Upper endoscopy yearsterday with edema in the duodenum. No attempt was made to do repeat ERCp with so much edenma.   Brief Assessment:  GEN well developed, well nourished, no acute distress   Cardiac Regular   Respiratory normal resp effort   Gastrointestinal Normal   Gastrointestinal details normal Soft  Nontender   Lab Results: Hepatic:  05-Jun-14 04:50   Bilirubin, Total  19.7  Alkaline Phosphatase  467  SGPT (ALT) 45  SGOT (AST)  59  Total Protein, Serum  6.2  Albumin, Serum  2.2  Routine Chem:  05-Jun-14 04:50   Glucose, Serum  167  BUN  38  Creatinine (comp)  1.61  Sodium, Serum 138  Potassium, Serum 3.9  Chloride, Serum  108  CO2, Serum  19  Calcium (Total), Serum 9.3  Osmolality (calc) 289  eGFR (African American)  50  eGFR (Non-African American)  43 (eGFR values <49m/min/1.73 m2 may be an indication of chronic kidney disease (CKD). Calculated eGFR is useful in patients with stable renal function. The eGFR calculation will not be reliable in acutely ill patients when serum creatinine is changing rapidly. It is not useful in  patients on dialysis. The eGFR calculation may not be applicable to patients at the low and high extremes of body sizes, pregnant women, and vegetarians.)  Anion Gap 11  Lipase  1208 (Result(s) reported on 17 Jan 2013 at 06:18AM.)  Routine Hem:  05-Jun-14 04:50   WBC (CBC)  14.0  RBC (CBC)  4.04  Hemoglobin (CBC)  11.9  Hematocrit (CBC)  34.7  Platelet Count (CBC) 274 (Result(s) reported on 17 Jan 2013 at 06:18AM.)  MCV 86  MCH 29.4  MCHC 34.3  RDW  19.0  Bands 5  Segmented Neutrophils 85  Lymphocytes 5  Monocytes 5  Diff Comment 1 TARGET CELLS  Diff Comment 2 ANISOCYTOSIS  Diff Comment 3 POIKILOCYTOSIS  Diff Comment 4 PLTS VARIED IN SIZE  Result(s) reported on 17 Jan 2013 at 06:18AM.   Radiology Results: XRay:    05-Jun-14 09:02, KUB - Kidney  Ureter Bladder  KUB - Kidney Ureter Bladder   REASON FOR EXAM:    ileus?, obstruction?  COMMENTS:       PROCEDURE: DXR - DXR KIDNEY URETER BLADDER  - Jan 17 2013  9:02AM     RESULT: There are loops of moderately distended gas-filled small bowel   distributed throughout the abdomen and in the pelvis. There is a moderate   amount of stool within the right colon. There are radiodense structures   in the right upper quadrant of the abdomen which may reflect stones.   There are degenerative changes of the lumbar spine.    IMPRESSION:  The bowel gas pattern suggests a small bowel ileus. I do not   see evidence of colonic obstruction at this point. There are likely   calcified gallstones present.   Dictation Site: 2        Verified By: Emric A. JMartinique M.D., MD  [place]    05-Jun-14 09:11, [place]  [place]   REASON FOR EXAM:    fluid in abdomen  COMMENTS:   Body Site: Right Upper Quad    PROCEDURE: [place] - [place] LIMITED SURVEY  - Jan 17 2013  9:11AM     RESULT: A limited upper abdominal ultrasound examination was performed.    The gallbladder is adequately distended and  contains sludge and multiple   echogenic shadowing nonmobile stones. There is no positive sonographic   Murphy's sign. The gallbladder wall measures 2.5 mm in thickness. There   is no pericholecystic fluid. The common bile duct measures 5.2 mm in   diameter.    The liver exhibits no focal mass or ductal dilation. Portal venous flow   is normal in direction toward the liver. Only limited evaluation of the     pancreas was possible due to bowel gas. The pancreatic duct is visible  and measures just under 3 mm in diameter.    IMPRESSION:   1. There are multiple nonmobile stones within the gallbladder as well as   sludge. There is no positive sonographic Murphy's sign. The gallbladder   wall appears to be less thick today measuring 2.5 mm which is within the   limits of  normal.  2. The common bile duct it measures just over 5 mm in diameter and may   have slightly decreased in caliber from the previously demonstrated 6 mm   on January 15, 2013.  3. The liver exhibits no acute abnormality. The visualized portions of   the pancreas appear normal. The pancreatic duct is visible.     Dictation Site: 2    Verified By: Art A. Martinique, M.D., MD   Assessment/Plan:  Assessment/Plan:  Assessment Ileus. Jaundice.   Plan Patients U/S showed improved CBD without any new findings. He will be started on a clear liquid diet today. LFT's improved. Continue to follow labs.   Electronic Signatures: Lucilla Lame (MD)  (Signed 05-Jun-14 14:25)  Authored: Chief Complaint, Brief Assessment, Lab Results, Radiology Results, Assessment/Plan   Last Updated: 05-Jun-14 14:25 by Lucilla Lame (MD)

## 2014-12-05 NOTE — Consult Note (Signed)
PT CC is jaundice, TB 18.5, alk phos 330, albumin2, creat 1.34, no vomiting, no nausea, no diarrhea, no severe abd pain.  Atarax helps with his itching.  I think he could go home and follow up in office in a week.    Electronic Signatures: Manya Silvas (MD)  (Signed on 08-Jun-14 09:56)  Authored  Last Updated: 08-Jun-14 09:56 by Manya Silvas (MD)

## 2014-12-05 NOTE — Consult Note (Signed)
CC: jaundice.  See dictated note.  Pt with severe past history of CAD with previous CABG many years ago, MI in past, stents post CABG and an implanted defib/pacemaker.  He has stones in GB and the most common cause for his current jaundice would be CBD obst.  He has no pain and no signs of cholangitis, yet.  Next most common would be pancreatico biliary tumor or stricture, ampullary tumor.  Would expect dilated ducts but not always.  He did take a course of Augmentin for 10 days starting March 27th per [place] [facility].  10% of drug induced liver disease can be of a chronic nature lasting several months.   proceed with CT.  Cannot do MRCP due to implanted device. If CT neg would do serial US and obvserve and hold on ERCP until his cardiologist Dr.Khan can see him for evaluation of cardiac risk with possibel ERCP and possible gall bladder removal.  Will check Ca19-9 in mean time.  Will follow with you, will need to discuss/consult with GI doc that does ERCP.  Electronic Signatures: Scot JunElliott, Robert T (MD)  (Signed on 26-May-14 13:15)  Authored  Last Updated: 26-May-14 13:15 by Scot JunElliott, Robert T (MD)

## 2014-12-05 NOTE — Consult Note (Signed)
Small ampulla within a diverticulum. Attempted cannulation of CBD without success. Guidewire kept going up the PD. Sharp bend in prox PD, which would not allow deep cannulation of PD to access CBD. Elected to stop. Keep patient NPO except meds. Check lipase in AM. Tried to contact interventional radiology to plac PTC. No response. Discussed case with Dr. Ether Griffins. Thanks.  Electronic Signatures: Verdie Shire (MD)  (Signed on 03-Jun-14 16:45)  Authored  Last Updated: 03-Jun-14 16:45 by Verdie Shire (MD)

## 2014-12-05 NOTE — Consult Note (Signed)
CC: jaundice.  Pt without CP or new SOB, no abd pain, no real change in LFT's at this time.  Plan per Dr. Bluford Kaufmannh is to hold plavix over weekend and ;try ERCP Monday or Tuesday if still same level of jaundice. VSS afebrile, chest clear ant lat fields.  Electronic Signatures: Scot JunElliott, Caydyn Sprung T (MD)  (Signed on 29-May-14 17:30)  Authored  Last Updated: 29-May-14 17:30 by Scot JunElliott, Zion Lint T (MD)

## 2014-12-05 NOTE — H&P (Signed)
PATIENT NAME:  [name], [name] MR#:  [id number] DATE OF BIRTH:  1945-10-10  DATE OF ADMISSION:  01/07/2013  PRIMARY CARE PHYSICIAN: Doristine MangoElizabeth White, MD  REFERRING PHYSICIAN: Chiquita LothJade Sung, MD   CHIEF COMPLAINT: Generalized itching.   HISTORY OF PRESENT ILLNESS: [name] is a 69 year old Caucasian male with past history of ischemic cardiomyopathy status post implanted defibrillator and pacemaker, systemic hypertension, peripheral vascular disease, hyperlipidemia, and diabetes mellitus type II. The patient was in his usual state of health until the last couple of days where he started to experience generalized itching and this is the main reason why he came to the Emergency Department. The patient also indicated that over the last few days he also noticed that his urine became dark orange to red in color. Otherwise he denies having any abdominal pain. No fever. No chills. No vomiting. No diarrhea. He is a vague historian speaking about some loose stools for quite some time, although he states that his stool is better lately. Denies any melena or hematochezia. Evaluation here at the Emergency Department revealed the patient is having jaundice. His bilirubin was 14. Ultrasound of the abdomen revealed evidence of cholelithiasis without complicating features. The patient is now in the process to be admitted to the hospital for further evaluation.   REVIEW OF SYSTEMS: CONSTITUTIONAL: Denies any fever. No chills. No fatigue. No weight loss. No night sweats.  EYES: No blurring of vision. No double vision.  ENT: No hearing impairment. No sore throat. No dysphagia.  CARDIOVASCULAR: No chest pain. No shortness of breath. No edema. No syncope.  RESPIRATORY: No shortness of breath. No cough. No hemoptysis.  GASTROINTESTINAL: No abdominal pain. No vomiting.  No diarrhea. The patient has jaundice, but he is not observant for that.  GENITOURINARY: No dysuria. No frequency of urination.  MUSCULOSKELETAL: No joint pain  or swelling. No muscular pain or swelling.  INTEGUMENTARY: No skin rash. No ulcers, but he has icteric skin.  NEUROLOGY: No focal weakness. No seizure activity. No headache.  PSYCHIATRY: No anxiety. No depression.  ENDOCRINE: No polyuria or polydipsia. No heat or cold intolerance.   PAST MEDICAL HISTORY: 1.  Ischemic cardiomyopathy status post implanted defibrillator and pacemaker.  2.  Peripheral vascular disease status post carotid stent.  3.  Hyperlipidemia.  4.  Systemic hypertension. 5.  Diabetes mellitus type 2.   PAST SURGICAL HISTORY:  1.  Coronary artery bypass graft in 1990. 2.  Cardioverted defibrillator implant and pacemaker insertion.  3.  Cardiac stent surgery.   SOCIAL HABITS: Chronic smoker, 1 pack per day since age of 69. He is still smoking. He drinks alcohol once in a while, may be a couple of beers every 6 months, as he tells me. No other drug abuse.   SOCIAL HISTORY: He is retired from working in [employer]. He lives at home with his girlfriend.   FAMILY HISTORY: His mother died at age of 70 from heart attack. He has no information about his father.   ADMISSION MEDICATIONS:  1.  Spironolactone 12.5 mg once a day. 2.  Ramipril 5 mg once a day. 3.  Omeprazole 40 mg a day. 4.  Lasix 20 mg a day. 5.  Isosorbide mononitrate 30 mg twice a day. 6.  Glipizide XL 10 mg a day. 7.  Plavix 75 mg a day. 8.  Aspirin 81 mg a day. 9.  Recently started on vitamin D, ergocalciferol, 50,000 units once a week.   ALLERGIES: No known drug allergies.   PHYSICAL  EXAMINATION: VITAL SIGNS: Blood pressure 126/94, respiratory rate 16, pulse 74, temperature 98, oxygen saturation 94%.  GENERAL APPEARANCE: Elderly male lying in bed in no acute distress.  HEENT:  Head and neck:  No pallor but he has icteric conjunctivae and skin is icteric as well. No cyanosis. Ears:  Normal hearing. No discharge. No ulcers. No lesions. Examination of the nose showed no discharge, no bleeding, no  ulcerations. Oropharyngeal:  Normal lips and tongue. No oral thrush. Eyes: Normal eyelids and the conjunctivae are icteric. Pupils are about 4 to 5 mm, round, equal, and reactive to light.  NECK: Supple. Trachea at midline. No thyromegaly. No cervical lymphadenopathy. No masses.  HEART: Normal S1, S2. No S3, S4. No murmur. No gallop. No carotid bruits.  LUNGS: Normal breathing pattern without use of accessory muscles. No rales. No wheezing.  ABDOMEN: Soft without tenderness. No hepatosplenomegaly. No masses. No hernias.  SKIN: Icteric skin. No ulcers.  MUSCULOSKELETAL: No joint swelling. No clubbing.  NEUROLOGIC: Cranial nerves II through XII are intact. No focal motor deficit.  PSYCHIATRY: The patient is alert and oriented x 3. Mood and affect were normal.   LABORATORY AND DIAGNOSTICS: Ultrasound of the abdomen revealed evidence of cholelithiasis, but no cholecystitis features by ultrasound. Limited visualization of the pancreas due to overlying bowel gas. The visualized portions of the pancreas are unremarkable.   Serum glucose 164, BUN 13, creatinine 1.5, sodium 136, potassium 3.6. Estimated GFR 46. Lipase 473. Total protein 8.8, albumin 4, total bilirubin 14.5, alkaline phosphatase 774, AST 120, ALT 134. CBC showed Shah count of 8000, hemoglobin 13, hematocrit 41, and platelet count 230. Prothrombin time 12. INR 0.9.   ASSESSMENT: 1.  Painless jaundice.  2.  Cholelithiasis without evidence of acute cholecystitis.  3.  Ischemic cardiomyopathy status post implantable defibrillator and pacemaker insertion. 4.  Peripheral vascular disease status post carotid stent. 5.  Systemic hypertension.  6.  Elevated lipase  7.  Diabetes mellitus, type 2. 8.  Hyperlipidemia.  9.  Chronic smoker. 10.  Elevated liver transaminases.   PLAN: We will admit the patient to the medical floor. I will repeat liver function test tomorrow and liver transaminases as well. The patient needs further imaging to  include the head of the pancreas to ensure there is no obstructing tumor as the cholelithiasis could be an incidental finding. I will consult gastroenterology. I will also consult the general surgery. Accu-Chek and sliding scale. For deep vein thrombosis prophylaxis, will place him on heparin subcutaneously. Continue home medications as listed above. The patient's code status is FULL CODE. I would like to add that the patient's cardiologist is Dr. Adrian Blackwater.   TIME SPENT: In evaluating this patient and reviewing his medical records took more than 55 minutes.  ____________________________ Carney Corners. Rudene Re, MD amd:sb D: 01/07/2013 05:41:18 ET T: 01/07/2013 07:16:07 ET JOB#: [id number]  cc: Carney Corners. Rudene Re, MD, <Dictator> Zollie Scale MD ELECTRONICALLY SIGNED 01/08/2013 6:39

## 2014-12-05 NOTE — Consult Note (Signed)
Brief Consult Note: Diagnosis: Jaundice, elevated LFTs.   Patient was seen by consultant.   Comments: Please see full GI Consult note. Plan for ERCP tomorrow w/ Dr Romero BellingWohl Urso Haptoglobin NPO afer [place]  #[id number]  Thanks for consult.  Electronic Signatures: Joselyn ArrowJones, Dia Jefferys L (NP)  (Signed 03-Jun-14 16:59)  Authored: Brief Consult Note   Last Updated: 03-Jun-14 16:59 by Joselyn ArrowJones, Christoper Bushey L (NP)

## 2014-12-05 NOTE — Consult Note (Signed)
Brief Consult Note: Diagnosis: CAD s/p CABG x 2.   Patient was seen by consultant.   Consult note dictated.   Recommend further assessment or treatment.   Orders entered.   Comments: Patient with pruritis and jaundice and needing cardiac clearance for ERCP and possible cholecystectomy. Patient denies CP, chest pressure, at this time. He had cardiac cath 12/20/11 with occlusions of SVG to PDA and OM2, but LIMA to LAD patent, stent in LCX with mild restenosis, diffuse RCA  disease. Subsequent stress test 09/2012 with changes c/w old infarction and no evidence of ischemia. He has cardiomyopathy with echo 03/23/12 reporting much imrpoved LVEF at 55%, moderate MR, mild diastolic dysfx and is s/p AICD. Will get EKG to r/o any acute abnormalities, if stable from previous, then can proceed with  ERCP and/or cholecystectomy. Can hold Plavix for 5 days, continue ASA 81mg /day.  Electronic Signatures: Radene KneeKhan, Shaukat Ali (MD)   (Signed 27-May-14 18:47)  Co-Signer: Brief Consult Note Verta EllenManzi, Lamarius Dirr A (PA-C)   (Signed 27-May-14 12:56)  Authored: Brief Consult Note  Last Updated: 27-May-14 18:47 by Radene KneeKhan, Shaukat Ali (MD)

## 2014-12-05 NOTE — Discharge Summary (Signed)
PATIENT NAME:  [name], [name] MR#:  [id number] DATE OF BIRTH:  [birth year]  DATE OF ADMISSION:  01/07/2013 DATE OF DISCHARGE:  01/20/2013  Addendum.  This is an addendum to interim discharge summary which was dictated by Dr. Katharina Caperima Vaickute on 01/16/2013.  Please review the history and physical on 01/07/2013 and interim discharge summary on 01/16/2013 with this.   DISCHARGE DIAGNOSES: 1.  Cholestatic jaundice.  2.  Hypertension.  3.  Diabetes.  4.  Ischemic cardiomyopathy.  5.  Peripheral vascular disease.   CONDITION ON DISCHARGE:  Stable.   CODE STATUS:  FULL CODE.   MEDICATIONS ON DISCHARGE:  1.  Plavix 75 mg oral tablet once a day.  2.  Omeprazole 40 mg once a day.  3.  Spironolactone 12.5 mg oral once a day.  4.  Ramipril 5 mg oral capsule once a day.  5.  Aspirin 81 mg oral tablet once a day.  6.  Magnesium oxide 400 mg oral tablet once a day.  8.  Docusate sodium 100 mg oral capsule 2 times a day.  9.  Nicotine patch once a day.   DIET ON DISCHARGE:  Low sodium, low fat, low cholesterol, carbohydrate-controlled ADA diet.  Diet to be supplement Ensure Plus, take 3 times a day.   DIET CONSISTENCY:  Regular.   ACTIVITY:  As tolerated.   TIMEFRAME TO FOLLOW-UP:  Within 1 to 2 weeks with GI clinic.    HOSPITAL COURSE AND STAY:  For full hospital course since admission to June 4th, please review history and physical done on 01/07/2013 by Dr. Marlaine HindAmir Darwish and interim discharge summary done on June 4th by Dr. Katharina Caperima Vaickute.  Updates after June 4th, as patient was under treatment for cholestatic jaundice, GI was following.  The patient's bilirubin level remained almost stable without coming down significantly.  He was tolerating diet properly and so Dr. Mechele CollinElliott was following him.  He suggested that his bilirubin level might take a long time to come down and we can send him home with advice to follow in the GI clinic and so we followed the recommendation and discharged the patient home.     LABORATORY RESULTS:  After June 4th, creatinine level was elevated 1.61, which came down gradually to 1.3.  His bilirubin level remained almost in the range of 18 and 19 and we discharged with the same.   CONSULTATIONS:  GI consult with Dr. Mechele CollinElliott.   Total time spent in this discharge 35 minutes.    ____________________________ Hope PigeonVaibhavkumar G. Elisabeth PigeonVachhani, MD vgv:ea D: 01/23/2013 22:48:15 ET T: 01/23/2013 23:50:05 ET JOB#: [id number]  cc: Hope PigeonVaibhavkumar G. Elisabeth PigeonVachhani, MD, <Dictator> Ezzard StandingPaul Y. Bluford Kaufmannh, MD Scot Junobert T. Elliott, MD  Altamese DillingVAIBHAVKUMAR Nikie Cid MD ELECTRONICALLY SIGNED 01/28/2013 22:17

## 2014-12-05 NOTE — Consult Note (Signed)
CC: jaundice.  Complicated patient with high risk for problems.  I agree with Dr. Anda KraftMarterre assessment and plan for Dr. Servando SnareWohl to try ERCP.  I saw images of pancreatic duct on previous ERCP attempt and it was extremely tortuous.    Electronic Signatures: Scot JunElliott, Geremiah Fussell T (MD)  (Signed on 03-Jun-14 17:38)  Authored  Last Updated: 03-Jun-14 17:38 by Scot JunElliott, Phuong Hillary T (MD)

## 2014-12-05 NOTE — Consult Note (Signed)
CC: jaundice.  I discussed ERCP with him briefly.  Dr. Bluford Kaufmannh to see over weekend and likely plan for ERCP for next week.  Still signif jaundice.  Electronic Signatures: Scot JunElliott, Josemiguel Gries T (MD)  (Signed on 30-May-14 19:47)  Authored  Last Updated: 30-May-14 19:47 by Scot JunElliott, Julee Stoll T (MD)

## 2014-12-05 NOTE — Consult Note (Signed)
PATIENT NAME:  [name], [name] MR#:  [id number] DATE OF BIRTH:  16-Jan-1946  GASTROENTEROLOGY CONSULTATION  DATE OF CONSULTATION:  01/07/2013  CONSULTING PHYSICIAN:  Scot Junobert T. Loreley Schwall, MD  The patient is a 69 year old white male who developed jaundice and severe itching over the last few days, and was admitted and found to have a bilirubin of 14. I was asked to see him in consultation.   PAST MEDICAL HISTORY: Severe heart disease, previous coronary bypass graft surgery at [facility]. Previous stents were placed in the past. Previous implant[facility] def[facility]. It has gone off one time, according to the patient. There is a history of carotid stent surgery.    [facility], which would have made him 69 years old at the time   FAMILY HISTORY: Mother had a myocardial infarction and died at age 70. He is not aware of his father'Shah medical history.   OTHER MEDICAL PROBLEMS: Include a history of diabetes, hypercholesterolemia, peripheral vascular disease, and episodes of noncompliance with medication. He was admitted in 2007 with Dr. Mariel KanskyKen Fath and was noted that he had been off of his medications for several months.   The patient currently denies any significant abdominal pain. No shortness of breath. No recent chest pains. No nausea or vomiting. No diarrhea. No bleeding.   HABITS: Smokes a pack a day. Alcohol: He denies drinking in his history from admission, 12/20/2011 and said that he occasionally drinks heavily. He may have stopped.   On questioning for recent antibiotics his girlfriend, who was on the telephone with the patient, said that he had taken some antibiotics a couple of months ago. I contacted [facility] in [place], and he did get a 10-day course of Augmentin on March 27th, which would have finished on about April 6th or 7th, if he finished it, and that would be about 7 or 8 weeks ago.   EXAMINATION: GENERAL: Pleasant white male in no acute  distress.  HEENT: Sclerae intensely icteric. The head is atraumatic. Tongue is negative.  CHEST: Clear. Few crackles in the left base, decrease with coughing.  HEART: Shows no murmurs or gallops I can hear.  ABDOMEN: Cannot palpate any hepatosplenomegaly. Mildly obese.  EXTREMITIES: No edema.  SKIN: Warm and dry.  PSYCH: Mood and affect are appropriate. The patient is alert and oriented.  VITAL SIGNS: Temperature 97.7, pulse 94, respirations 18, blood pressure 134/89, 94% sat on room air.   Ultrasound shows gallstones in the gallbladder. No evidence of dilated ducts.   LABORATORY DATA: BUN 13, creatinine 1.54, sodium 136, potassium 3.6, chloride  105, CO2 22, calcium 10.   Lipase 473, total protein 8.8, albumin 4, total bili 14.5, direct bili 10.9, alkaline phosphatase 774, SGOT of 120, SGPT of 134.   White count 8.1, hemoglobin 13.7, platelet count 230. Protime 12.8, INR of 0.9.   Urinalysis shows 1+ bilirubin.   ASSESSMENT: New-onset jaundice in a patient with multiple cardiac problems who has smoked a pack a day most all of his life. Most likely cause for this problem, statistically speaking, would indeed be a common bile duct stone. He does not show any dilated ducts and does not have significant right upper quadrant pain, and generally speaking, with common bile duct stone, If that is the sole origin, the bilirubin rarely gets above 10, so we must consider this possibility as well as other causes. Another cause could certainly be a pancreatic neoplasm producing obstructive jaundice, also an  ampullary mass could do the same thing. Would expect to see dilated ducts, but this can occur sometimes without it.   Recommend a CAT scan with contrast of the abdomen. I recommend a CA-19-9. Recommend hepatitis A, B, and C studies as well as an ANA and AMA, although 90% of primary biliary cirrhosis occurs in females.   He did take a course of Augmentin about 7 or 8 weeks ago. About 10% of  drug-induced hepatitis can become chronic and last for several months, and that is a possibility.   His significant history of coronary artery disease dating back over 20 years is a cause for concern, and this, plus his Plavix and aspirin, place him at a very high risk for complications with either ERCP or surgery.   Plan to consult his cardiologist, Dr. Adrian Blackwater, tomorrow, and to discuss his case and possible consult with one of the gastroenterologists that does ERCPs. The patient was briefly discussed with Dr. Michela Pitcher and Dr. Allena Katz.      ____________________________ Scot Jun, MD rte:dm D: 01/07/2013 13:30:45 ET T: 01/07/2013 14:46:31 ET JOB#: [id number]  cc: Scot Jun, MD, <Dictator> Laurier Nancy, MD Dr. Baird Cancer. WHITE Quentin Ore III, MD  Scot Jun MD ELECTRONICALLY SIGNED 01/23/2013 7:15

## 2014-12-05 NOTE — Consult Note (Signed)
PATIENT NAME:  [name], [name] MR#:  [id number] DATE OF BIRTH:  Jul 18, 1946  DATE OF CONSULTATION:  01/15/2013  REFERRING PHYSICIAN:  Katharina Caperima Vaickute, MD   CONSULTING PHYSICIAN:  Joselyn ArrowKandice L. Butson, NP GASTROENTEROLOGIST: [name] Wohl, MD  PRIMARY CARE PHYSICIAN: Charlann BoxerE. B. White, NP  REASON FOR CONSULTATION: Jaundice, hyperbilirubinemia and failed ERCP.   HISTORY OF PRESENT ILLNESS: [name] is a 69 year old Caucasian male who developed jaundice, pruritus and upper abdominal pain approximately 2 weeks ago. He also has anorexia, malaise, acholic stools and nausea and vomiting. He tells me he has lost weight over the last several months but cannot quantify. He denies any fever or chills. He does have a history of coronary artery disease and is status post defibrillator pacemaker placement and CABG with history of stents as well. He denies any alcohol or illicit drug use. He rates his abdominal pain 10 out of 10 at worst. He is not having any pain currently. There is no known family history of liver disease. He was on Augmentin 2 weeks ago. He had an acute hepatitis panel which was positive for hepatitis A total antibody, otherwise, negative. He had an ANA that was positive. AMA was negative.   He had a failed cannulation of his common bile duct on last ERCP yesterday by Dr. Lutricia FeilPaul Oh. He has a bilirubin of 18, mostly direct. He has an alkaline phosphatase of 671. AST is 65 and ALT is normal at 63. Hemoglobin is 12.9. Blood smear showed anisocytosis, poikilocytosis, polychromasia and  target cells. He had a CA-19-9 which was high at 94. His INR was 0.9. 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 Ultrasound from today shows a contracted gallbladder with stones, findings concerning for acute cholecystitis. Pancreatic duct is 4.2 mm, mildly dilated.  Common bile duct is 6 mm. Per Dr. Nigel Bridgemanh'Shah ERCP report from  yesterday, he had a small ampulla within a diverticulum.  He made multiple attempts to cannulate the CBD unsuccessfully. Wire kept going up the pancreatic duct. He had a sharp curve in the proximal pancreatic duct. He was unable to place a stent. Due to ampulla being situated within the diverticula, he elected not to do a needle knife sphincterotomy.   PAST MEDICAL AND SURGICAL HISTORY:  Coronary artery disease status post stent placement, CABG and implantable defibrillator pacemaker, history of carotid stent surgery, abdominal aortic aneurysm, diabetes mellitus, hypercholesterolemia, peripheral vascular disease.  MEDICATIONS PRIOR TO ADMISSION: Aspirin 81 mg daily, Plavix 75 mg daily, ergocalciferol 50,000 international units daily, glipizide XL 10 mg extended release daily, isosorbide 30 mg extended release b.i.d., Lasix 20 mg daily, omeprazole 40 mg daily, ramipril 5 mg daily, spironolactone 12.5 mg daily.   ALLERGIES: No known drug allergies.   FAMILY HISTORY: There is no known family history of liver or pancreatic disease.   SOCIAL HISTORY: He is retired. He has a long-standing history of tobacco abuse daily. He states he rarely drinks alcohol. He does live with his girlfriend.   REVIEW OF SYSTEMS: See HPI, otherwise negative complete review of systems.   PHYSICAL EXAMINATION: VITAL SIGNS: Temperature 98.2, pulse 90, respirations 18, blood pressure 131/84, O2 saturation 95% on room air.  GENERAL: He is an acutely ill Caucasian male who is jaundiced. He is lying comfortably in his bed.  HEENT: Scleral icterus, conjunctivae pale. Oropharynx pink and dry.  NECK: Supple without any mass or thyromegaly.  CHEST:  Heart regular rate and rhythm with normal S1, S2 without any murmurs, clicks, rubs or gallops.  LUNGS: Clear to auscultation bilaterally.  ABDOMEN: Multiple large ecchymoses throughout the mid abdomen. He does have obese abdomen that is soft, nondistended and mildly tender to around the  umbilicus and upper abdomen, unable to palpate hepatosplenomegaly or mass.  EXTREMITIES: Without edema. He does have clubbing.  SKIN: Jaundice, dry.  Multiple ecchymoses.  PSYCHIATRIC: Alert, oriented, pleasant, cooperative, normal mood and affect.  NEUROLOGICAL:  Grossly intact.   LABORATORY STUDIES: See HPI.  Admission lipase 473.   IMPRESSION: [name] is a 69 year old Caucasian male who developed abdominal pain, nausea, malaise, severe pruritus and jaundice. He has a direct hyperbilirubinemia; however, he does not have significantly dilated common bile duct. He had previous unsuccessful attempt at ERCP by Dr. Bluford Kaufmann yesterday as he was unable to cannulate the common bile duct. He has an elevated CA-19-9. He has a positive ANA. No evidence of AMA-positive PBC. Differentials include biliary obstruction or extrahepatic cholestasis such as choledocholithiasis or malignancy or PSC versus intrahepatic cholestasis from medication effect or ischemia. I have discussed this case with Dr. Servando Snare. Our plan of care is below.  PLAN: 1.  ERCP with possible stone extraction, biopsy, sphincterotomy, and/or stent placement tomorrow at noon. I have discussed risks and benefits which include but are not limited to pancreatitis, bleeding, infection, drug reaction or perforation. He agrees with this plan and consent will be obtained.  2.  N.p.o. after midnight.  3.  May have clear liquids today.  4.  Begin Urso for stone dissolution.  5.  Haptoglobin.  6.  Further recommendations pending ERCP findings tomorrow.    ____________________________ Joselyn Arrow, NP klj:cb D: 01/15/2013 16:59:10 ET T: 01/15/2013 17:16:09 ET JOB#: [id number]  cc: Joselyn Arrow, NP, <Dictator> Courtney Paris. Cliffton Asters, NP Joselyn Arrow FNP ELECTRONICALLY SIGNED 01/16/2013 14:05

## 2014-12-05 NOTE — Consult Note (Signed)
CC: jaundice.  Pt denies any new complaints except lower suprapubic pain when he urinates.  Skin slightly less itching.  Chest clear, heart RRR, Abd no masses, non tender, no HSM.  TB 13, DB 10,. alk phos 700, WBC 6, hgb 12.  Will discuss with Dr. Candace Cruise for possible ERCP since cardiology approves.  Will hold plavix, continue asprin.  Electronic Signatures: Manya Silvas (MD)  (Signed on 27-May-14 15:02)  Authored  Last Updated: 27-May-14 15:02 by Manya Silvas (MD)

## 2014-12-05 NOTE — Consult Note (Signed)
Chief Complaint:  Subjective/Chief Complaint Pt rates mid-abd pressure 5/10.  NO BM in 6 days.  MOM unsuccessful last PM.  KUB yest-Ileus.  No attempt at ERCP due to retained gastric fluid/SB edema.  Denies N/V.  Pruritis resolved.   VITAL SIGNS/ANCILLARY NOTES: **Vital Signs.:   06-Jun-14 05:38  Temperature Temperature (F) 98.5  Celsius 36.9  Temperature Source oral  Pulse Pulse 101  Respirations Respirations 18  Systolic BP Systolic BP 707  Diastolic BP (mmHg) Diastolic BP (mmHg) 79  Mean BP 96  Pulse Ox % Pulse Ox % 92  Pulse Ox Activity Level  At rest  Oxygen Delivery Room Air/ 21 %   Brief Assessment:  GEN well developed, well nourished, no acute distress, A/Ox3.   Cardiac Regular   Respiratory normal resp effort   Gastrointestinal Normal   Gastrointestinal details normal Soft  Nontender  Bowel sounds normal   Additional Physical Exam Skin:Jaundice   Lab Results: Hepatic:  06-Jun-14 04:51   Bilirubin, Total  19.1  Alkaline Phosphatase  395  SGPT (ALT) 42  SGOT (AST)  53  Total Protein, Serum  6.1  Albumin, Serum  2.1  Routine Chem:  06-Jun-14 04:51   Glucose, Serum  160  BUN  35  Creatinine (comp)  1.48  Sodium, Serum  135  Potassium, Serum 3.5  Chloride, Serum 105  CO2, Serum  20  Calcium (Total), Serum 9.3  Osmolality (calc) 281  eGFR (African American)  56  eGFR (Non-African American)  48 (eGFR values <39m/min/1.73 m2 may be an indication of chronic kidney disease (CKD). Calculated eGFR is useful in patients with stable renal function. The eGFR calculation will not be reliable in acutely ill patients when serum creatinine is changing rapidly. It is not useful in  patients on dialysis. The eGFR calculation may not be applicable to patients at the low and high extremes of body sizes, pregnant women, and vegetarians.)  Anion Gap 10   Radiology Results: XRay:    05-Jun-14 09:02, KUB - Kidney Ureter Bladder  KUB - Kidney Ureter Bladder   REASON  FOR EXAM:    ileus?, obstruction?  COMMENTS:       PROCEDURE: DXR - DXR KIDNEY URETER BLADDER  - Jan 17 2013  9:02AM     RESULT: There are loops of moderately distended gas-filled small bowel   distributed throughout the abdomen and in the pelvis. There is a moderate   amount of stool within the right colon. There are radiodense structures   in the right upper quadrant of the abdomen which may reflect stones.   There are degenerative changes of the lumbar spine.    IMPRESSION:  The bowel gas pattern suggests a small bowel ileus. I do not   see evidence of colonic obstruction at this point. There are likely   calcified gallstones present.   Dictation Site: 2        Verified By: Della A. JMartinique M.D., MD  [place]    05-Jun-14 09:11, [place]dom[place]  [place]   REASON FOR EXAM:    fluid in abdomen  COMMENTS:   Body Site: Right Upper Quad    PROCEDURE: [place] - [place] LIMITED SURVEY  - Jan 17 2013  9:11AM     RESULT: A limited upper abdominal ultrasound examination was performed.    The gallbladder is adequately distended and contains sludge and multiple   echogenic shadowing nonmobile stones. There is no positive sonographic  Murphy's sign. The gallbladder wall measures 2.5 mm in thickness. There   is no pericholecystic fluid. The common bile duct measures 5.2 mm in   diameter.    The liver exhibits no focal mass or ductal dilation. Portal venous flow   is normal in direction toward the liver. Only limited evaluation of the     pancreas was possible due to bowel gas. The pancreatic duct is visible  and measures just under 3 mm in diameter.    IMPRESSION:   1. There are multiple nonmobile stones within the gallbladder as well as   sludge. There is no positive sonographic Murphy's sign. The gallbladder   wall appears to be less thick today measuring 2.5 mm which is within the   limits of normal.  2. The common bile duct it measures just over 5 mm in  diameter and may   have slightly decreased in caliber from the previously demonstrated 6 mm   on January 15, 2013.  3. The liver exhibits no acute abnormality. The visualized portions of   the pancreas appear normal. The pancreatic duct is visible.     Dictation Site: 2    Verified By: Jayce A. Martinique, M.D., MD   Assessment/Plan:  Assessment/Plan:  Assessment Ileus:  BS improved today.  No N/V. Jaundice: Bilirubin & LFTS gradually improving. Constipation:  Add colace & MOM.   Plan 1) Continue to follow LFTS 2) Add MOM this AM 3) Colace 16m BID 4) Continue clear liquids [facility] to follow over the weekend   Electronic Signatures: JAndria Meuse(NP)  (Signed 06-Jun-14 09:54)  Authored: Chief Complaint, VITAL SIGNS/ANCILLARY NOTES, Brief Assessment, Lab Results, Radiology Results, Assessment/Plan   Last Updated: 06-Jun-14 09:54 by JAndria Meuse(NP)

## 2014-12-05 NOTE — Consult Note (Signed)
PATIENT NAME:  [name], [name] MR#:  [id number] DATE OF BIRTH:  [birth year]  DATE OF CONSULTATION:  01/08/2013  REFERRING PHYSICIAN:  Lynnae Prude, MD CONSULTING PHYSICIAN:  Verta Ellen, PA-C  REASON FOR CONSULTATION: Coronary artery disease, needs cardiac clearance for possible ERCP and possible laparoscopic cholecystectomy.   HISTORY OF PRESENT ILLNESS: Mr. Baruc Tugwell is a 69 year old white male who is known to our practice. Mr. Marsalis has a history of multiple medical problems which include coronary artery disease, cardiomyopathy, chronic kidney disease, hypertension, hyperlipidemia, diabetes, carotid artery disease and atrial fibrillation. The patient presented to the hospital as he was found to be jaundiced and had pruritus. The patient currently denies any nausea, vomiting or abdominal pain.  Is actually eating at this time. He was found to have cholelithiasis and possible obstruction of his common bile duct.  The patient currently denies any chest pain, chest pressure, heaviness, squeezing jaw pain or left arm pain. He has shortness of breath on exertion (only strenuous exertion), but denies any orthopnea or PND. He does continue to smoke and has associated coughing and wheezing, which he believes is from his smoking habit. He had some slight edema in the right foot which has since resolved. The patient has been followed in our office and has had fairly recent work-up with echocardiogram, stress testing and cardiac catheterization which was done last year.   PAST MEDICAL HISTORY: 1.  Atrial fibrillation.  2.  Acute myocardial infarction.  3.  Angioplasty.  4.  Cardiomyopathy. 5.  Hypertension.  6.  Hyperlipidemia.  7.  Diabetes mellitus.  8.  Chronic kidney disease, stage III. 9.  Secondary hyperparathyroidism. 10. Anemia of chronic disease.  11.  Edema, possibly from venous insufficiency.  12.  Cardiomyopathy with AICD placed in 2011 at [facility]. PAST  SURGICAL HISTORY: Cardiac catheterization 12/20/2011, distal main 50% stenosis, mid LAD 100% stenosis, D1 60% stenosis, proximal circumflex 40% stenosis at site of prior stent, proximal RCA 60% stenosis, mid RCA 100% stenosis, RV marginal 70% stenosis, SVG to PDA and SVG to OM2 occluded, LIMA to LAD patent. LVEF 35%.  ALLERGIES: No known drug allergies.   HOME MEDICATIONS: 1.  Lisinopril 5 mg p.o. daily.  2.  Aspirin 81 mg p.o. daily.  3.  Glipizide 10 mg p.o. once daily.  4.  Imdur 30 mg twice daily.  5.  Lasix 20 mg twice daily.  6.  Nitrostat 0.4 mg sublingually as needed for chest pain.  7.  Omeprazole 40 mg p.o. daily.  8.  Plavix 75 mg p.o. daily.  9.  Simvastatin 40 mg p.o. at bedtime.  10.  Spironolactone 25 mg 1/2 tablet daily.   SOCIAL HISTORY: The patient smokes approximately 1 pack of cigarettes per day and has done so for several years. He only rarely uses alcohol. Denies any illicit drug use.   FAMILY HISTORY: Mother history of heart attack, no family history of CVA.  REVIEW OF SYSTEMS:  SYSTEMIC:  The patient denies any fatigue.  EYES:  The patient denies any tinnitus,  double vision.  RESPIRATORY:  The patient with chronic coughing and wheezing. Shortness of breath only with extreme exertion.  CARDIOVASCULAR: The patient denies any chest pain, chest pressure or heaviness.  ABDOMEN: The patient denies any nausea, vomiting or abdominal pain.   PHYSICAL EXAMINATION: GENERAL: This is a pleasant male who appears visibly jaundiced. He is alert and oriented and currently eating his lunch.  VITAL SIGNS: With a temperature of 98.1  degrees Fahrenheit, heart rate 91, respiratory rate 18, blood pressure 101/66, O2 sat is 94% on room air.  HEENT: Head: Atraumatic, normocephalic. Eyes:  The patient has scleral Icterus. Pupils are round and equal. Conjunctivae pale, pink. Ears and nose are normal to external inspection. Mouth:  Moist mucous membranes.  NECK: Supple. Trachea is  midline.  LUNGS: Clear to auscultation with some diminished breath sounds, no adventitious breath sounds, no accessory muscle use.  HEART:  The patient has implantable AICD in the chest wall. Regular rate and rhythm. No murmurs, rubs or gallops appreciated. No JVT.  ABDOMEN: Is nontender. Bowel sounds present in all 4 quadrants. Soft.  EXTREMITIES: No cyanosis, clubbing or edema.   ANCILLARY DATA: Labs with glucose of 233, BUN 18, creatinine 1.67, sodium 139, potassium 3.9, chloride is 107. Estimated GFR is 41.  Lipase is 473.  Total protein is 6.8. Albumin is 3.0.  Total bilirubin is 13.0.  Direct bilirubin is 10.20. Alkaline phosphatase 705, AST 80, ALT is 92. White blood cell count is 6.1, hemoglobin 12.3, hematocrit 35.4, platelet count 198,000. PT 12.8. INR 0.9.  Abdominal ultrasound on May 26th:  Gallstones, no evidence of acute cholecystitis.   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   ASSESSMENT AND PLAN: 1.  Coronary artery disease. The patient has a history of coronary artery disease status post coronary artery bypass grafting x 2 and stent placement in the left circumflex. Currently denies any chest pain, chest pressure at this time. He had cardiac catheterization approximately 1 year ago, on 12/20/2011, with occlusions of the saphenous vein graft to PDA and OM 2, but the LIMA to LAD was patent, the stent in left circumflex with mild restenosis, and had diffuse RCA disease. He had subsequent stress testing in February 2014.  He had changes with old infarction, but no evidence of ischemia. He has a history of cardiomyopathy, but echocardiogram done on 03/23/2012 with much improved ejection fraction of 55%. We will get an EKG at this time  to rule out any acute abnormalities, and if stable from previous can proceed with ERCP and/or cholecystectomy. Can hold Plavix for 5 days, continue aspirin 81 mg daily. We will continue to follow this patient with you.  2.  Hypertension.  The patient's blood pressure is well controlled at this time.  3.  Diabetes mellitus.  Being followed by the hospitalist.  4.  Chronic kidney disease. The patient currently sees Dr. Wynelle LinkKolluru.   5.  Gallstones with jaundice.  Dr. Mechele CollinElliott following and plan is for possible ERCP plus/minus cholecystectomy.  ____________________________ Verta EllenMonica A. Jahrell Hamor, PA-C mam:sb D: 01/08/2013 13:08:11 ET T: 01/08/2013 13:47:54 ET JOB#: [id number]  cc: Verta EllenMonica A. Michelena Culmer, PA-C, <Dictator> Laverda SorensonSarath C. Kolluru, MD Carmie Endalph L. Ely III, MD Scot Junobert T. Elliott, MD Constantinos Krempasky A [facility] PA ELECTRONICALLY SIGNED 01/10/2013 11:25

## 2014-12-06 NOTE — Consult Note (Signed)
PATIENT NAME:  [name], [name] DATE OF BIRTH:  07/21/1946  DATE OF CONSULTATION:  03/25/2014  REFERRING PHYSICIAN:   CONSULTING PHYSICIAN:  [name], [name]   INDICATION FOR CONSULTATION: Chest pain and dizziness, along with SVT.    HISTORY OF PRESENT ILLNESS: This is a [age] year old white male with a past medical history of CABG x 2, history of cardiomyopathy, [facility], presented to the Emergency Room after having an SVT. He had an episode where he was feeling dizzy, weak, and left arm pain. EMS was called. EMS noticed that he was in supraventricular tachycardia. He was told to do Valsalva maneuver and he went back into a paced rhythm. He was hypotensive at that time. Right now his blood pressure is 106/70, feeling much better. He was also given nitroglycerin with some relief of the arm pain that he was having.   PAST MEDICAL HISTORY: As mentioned, history of cardiomyopathy, history of ejection fraction of 25%, [facility], history of MI, history of CHF.   SOCIAL HISTORY: No history of EtOH abuse. No smoking.   FAMILY HISTORY: Positive for coronary artery disease.   MEDICATIONS: Carvedilol 6.25 b.i.d., Lasix 20 mg once a day. He is not on an ACE inhibitor or enalapril.   PHYSICAL EXAMINATION: GENERAL: He is alert, oriented x 3.  VITAL SIGNS: His blood pressure as mentioned is 106/70, respirations 18, and pulse 86 as a paced rhythm.  HEENT: Positive JVD.  LUNGS: Clear.  HEART: Regular rate and rhythm. Normal S1, S2. No audible murmur.  ABDOMEN: Soft, nontender, positive bowel sounds.  EXTREMITIES: No pedal edema.  NEUROLOGIC: The patient appears to be intact.   DIAGNOSTIC DATA: His EKG shows 100% paced rhythm, 86 beats per minute, is a VVI paced rhythm.   LABORATORY DATA: Troponin is slightly elevated at 0.11, BUN 18, creatinine 1.5. BNP is 1358. His white count is 6.2, hemoglobin is 11, platelet  count is 141.   ASSESSMENT AND PLAN: The patient had supraventricular tachycardia; apparently it was not fast enough for ICD to fire. He has mildly elevated troponin, most likely secondary to demand ischemia due to supraventricular tachycardia. Currently, he appears to be in stable condition. He denies any chest pain or shortness of breath. Mildly elevated troponin is due to demand ischemia. He already had a cardiac catheterization last year; will look at the films. Advised continuation of his current medications and observe overnight with followup in the office. He is normally hypotensive, about 90 systolic. We were not able to give him ACE inhibitors or losartan because of hypotension. When he developed supraventricular tachycardia, probably it tipped him over, and will have to get his ejection fraction improved by titrating his carvedilol and maybe adding an ACE inhibitor gradually.    ____________________________ [name], [name]:at D: 03/25/2014 18:36:40 ET T: 03/25/2014 18:47:24 ET JOB#: [id number]  cc: [name] A. Johnathin Vanderschaaf, [name], <Dictator> [name] ELECTRONICALLY SIGNED 05/01/2014 13:28

## 2014-12-06 NOTE — H&P (Signed)
PATIENT NAME:  [name], MCCREADIE MR#:  [id number] DATE OF BIRTH:  11-Apr-1946  PRIMARY CARE PROVIDER: Dr. Cliffton Asters.   CARDIOLOGIST:  Laurier Nancy, MD   CHIEF COMPLAINT: Left arm pain, hypotension, not feeling well.   HISTORY OF PRESENTING ILLNESS: A 69 year old Caucasian male patient with history of ischemic cardiomyopathy with EF of 35% with a pacemaker defibrillator, hypertension, diabetes, prior history of cholestatic jaundice, presents to the Emergency Room after he felt extremely weak and when his wife checked his blood pressure, it was un-recordable.  EMS was called. On arrival by EMS the patient was found to have SVT and with a Valsalva maneuver his SVT resolved, but patient did have some ongoing left arm pain along with this episode, which did not resolve. On his ride in EMS, he took a dose of sublingual nitroglycerin that he had in his pocket with which his pain resolved.   He mentioned that he had similar pain with his prior heart attack and also similar pain a few months prior, but nothing since. His blood pressure tends to run in the normal range most days. No recent change in medications.   Here in the Emergency Room, the patient has a paced rhythm. His troponin is elevated at 0.11 and is being admitted to the hospitalist service for the angina and elevated troponin.   PAST MEDICAL HISTORY: 1.  Ischemic cardiomyopathy, status post defibrillator pacemaker with EF of 35%.  2.  Peripheral vascular disease.  3.  Hyperlipidemia.  4.  Hypertension.  5.  Diabetes mellitus type 2.  6.  Chronic systolic CHF.  7.  History of cholestatic jaundice.   PAST SURGICAL HISTORY: 1.  CABG in 1990.  2.  Defibrillator pacemaker insertion.   SOCIAL HISTORY: The patient smokes a pack a day. He does not drink alcohol. Lives with his wife.  Does not use any illicit drugs.  Ambulates on his own.   FAMILY HISTORY: Mother died of a heart attack at age 65.   ALLERGIES: No known drug allergies.   HOME  MEDICATIONS: 1.  Aspirin 81 mg once a day.  2.  Coreg 6.25 mg oral 2 times a day.  3.  Lasix 20 mg 2 times a day.  4.  Multivitamin 1 tablet daily.  5.  Nitroglycerin transdermal 1 patch daily 0.1 mg per hour.  6.  Nitroglycerin 0.4 sublingual as needed for chest pain.  7.  Omeprazole 20 mg oral once a day.  8.  Pravastatin 40 mg oral once a day.  9.  Zolpidem 10 mg oral once a day at bedtime as needed for insomnia.  REVIEW OF SYSTEMS:  CONSTITUTIONAL: Complains of some fatigue. EYES:  No blurry vision, pain or redness.   HEENT:  No tinnitus, ear pain, hearing loss.  RESPIRATORY:  No cough, wheeze, hemoptysis. CARDIOVASCULAR: Had some left arm pain. Has chronic lower extremity edema which is improved.  GASTROINTESTINAL: No nausea, vomiting, diarrhea, abdominal pain.  GENITOURINARY: No dysuria, hematuria, frequency.  ENDOCRINE: No polyuria, nocturia, thyroid problems.  HEMATOLOGIC AND LYMPHATIC: No anemia, easy bruising, bleeding.  INTEGUMENTARY: No acne, rash, or lesion. MUSCULOSKELETAL: No back pain. Does have arthritis.  PSYCHIATRIC: No anxiety or depression.   PHYSICAL EXAMINATION: VITAL SIGNS: Shows temperature 97.6, pulse of 82, blood pressure 95/64, improved to 106/67, saturating 96% on room air.  GENERAL: Moderately built Caucasian male patient sitting up in bed, seems comfortable, conversational, cooperative with exam.  PSYCHIATRIC: Alert and oriented x 3, pleasant.   HEENT: Atraumatic, normocephalic.  Mucous membranes are moist and pink. External ears and nose normal. Pallor positive. No icterus. Pupils are round, equal and react to light.  NECK: Supple. No thyromegaly. No palpable lymph nodes. Trachea midline. No carotid bruit or JVD.  CARDIOVASCULAR: S1, S2, without any murmurs. Peripheral pulses 2+. Has 1+ edema.  RESPIRATORY: Normal work of breathing. Clear to auscultation on both sides.  GASTROINTESTINAL: Soft abdomen, nontender. Bowel sounds present. No splenomegaly  palpable.  SKIN: Warm and dry. No petechiae, rash, ulcers.  MUSCULOSKELETAL: No joint swelling, redness in large joints. Normal muscle tone. NEUROLOGICAL:  Motor, sensory 5/5 in upper extremities.  LYMPHATIC:  No cervical lymphadenopathy.   LABORATORY STUDIES: Show glucose of 296 with BNP of 1358 with BUN 18, creatinine 1.5, serum sodium 139, potassium 3.8, chloride 106, troponin 0.11. WBC 20, hemoglobin 11.6, platelets 141,000 with INR of 1.   IMAGING:  Chest x-ray, portable, shows prior CABG, AICD in place. No pleural effusions or pulmonary edema.   EKG shows a paced rhythm at a rate of 86.   ASSESSMENT AND PLAN: 1.  Left arm pain, angina, similar to his prior heart attacks. The patient did have supraventricular tachycardia which could have caused his left arm pain, but this did not resolve in spite of the supraventricular tachycardia resolution. This did respond to the nitroglycerin. The patient has significant coronary artery disease on his last cardiac catheterization in 2014. I do not think this is non-ST segment elevation myocardial infarction. His elevated troponin could be due to supraventricular tachycardia and demand ischemia from low blood pressure. We will check 2 more sets of cardiac enzymes. No heparin drip or therapeutic Lovenox at this time. Monitor cardiac enzymes. Consult Dr. Welton FlakesKhan, his cardiologist. Continue his aspirin and statin and beta blocker at this point. We will also check a TSH level. The patient is unsure if he had a stress test, but thinks he could have had one 2 months prior with Dr. Welton FlakesKhan.   2. Hypertension. Continue home medications. The patient was hypotensive earlier, likely secondary from his supraventricular tachycardia.  3.  Chronic kidney disease III versus acute renal failure. The patient's baseline creatinine seems to be around less than 1.  Presently at 1.5. It is unclear if he has developed chronic kidney disease stage III or has acute renal failure.  We will  decrease his Lasix from 2 times a day to once a day at this point as his lungs are clear. Chest x-ray does not show any pulmonary edema. He does not have any orthopnea.  4.  Diabetes mellitus. Put him on sliding scale insulin, home medications.  5.  Deep vein thrombosis prophylaxis with sequential compression devices. The patient does not tolerate any Lovenox or heparin because of internal bleeding per his family.   CODE STATUS: Full code.   Time spent on this case was 35 minutes.   ____________________________ Molinda BailiffSrikar R. Celise Bazar, MD srs:LT D: 03/25/2014 18:18:00 ET T: 03/25/2014 18:51:12 ET JOB#: [id number]  cc: Laurier NancyShaukat A. Khan, MD Lasean Rahming R. Thana Ramp, MD, <Dictator>   Orie FishermanSRIKAR R Unique Sillas MD ELECTRONICALLY SIGNED 03/31/2014 14:12

## 2014-12-06 NOTE — Discharge Summary (Signed)
Dates of Admission and Diagnosis:  Date of Admission 25-Mar-2014   Date of Discharge 26-Mar-2014   Admitting Diagnosis SVT, Chest pain   Final Diagnosis SVT- chest pain- some hypotension. ischemic cardiomyopathy- s/p AICD Htn    Chief Complaint/History of Present Illness A 69 year old Caucasian male patient with history of ischemic cardiomyopathy with EF of 35% with a pacemaker defibrillator, hypertension, diabetes, prior history of cholestatic jaundice, presents to the Emergency Room after he felt extremely weak and when his wife checked his blood pressure, it was un-recordable.  EMS was called. On arrival by EMS the patient was found to have SVT and with a Valsalva maneuver his SVT resolved, but patient did have some ongoing left arm pain along with this episode, which did not resolve. On his ride in EMS, he took a dose of sublingual nitroglycerin that he had in his pocket with which his pain resolved.   He mentioned that he had similar pain with his prior heart attack and also similar pain a few months prior, but nothing since. His blood pressure tends to run in the normal range most days. No recent change in medications.   Here in the Emergency Room, the patient has a paced rhythm. His troponin is elevated at 0.11 and is being admitted to the hospitalist service for the angina and elevated troponin.   Allergies:  Heparin: Do NOT Use  Thyroid:  11-Aug-15 15:57   Thyroid Stimulating Hormone 1.23 (0.45-4.50 (International Unit)  ----------------------- Pregnant patients have  different reference  ranges for TSH:  - - - - - - - - - -  Pregnant, first trimetser:  0.36 - 2.50 uIU/mL)  Routine Chem:  11-Aug-15 15:57   Result Comment TROPONIN - RESULTS VERIFIED BY REPEAT TESTING.  - CALLED TO AND READ BACK BY KATIE  - [place] AT 1642 03/25/14 SAL  Result(s) reported on 25 Mar 2014 at 04:45PM.  Glucose, Serum  296  BUN 18  Creatinine (comp)  1.50  Sodium, Serum 139  Potassium,  Serum 3.8  Chloride, Serum 106  CO2, Serum 24  Calcium (Total), Serum 8.5  Anion Gap 9  Osmolality (calc) 290  eGFR (African American)  54  eGFR (Non-African American)  47 (eGFR values <36m/min/1.73 m2 may be an indication of chronic kidney disease (CKD). Calculated eGFR is useful in patients with stable renal function. The eGFR calculation will not be reliable in acutely ill patients when serum creatinine is changing rapidly. It is not useful in  patients on dialysis. The eGFR calculation may not be applicable to patients at the low and high extremes of body sizes, pregnant women, and vegetarians.)  B-Type Natriuretic Peptide ([facility]  1358 (Result(s) reported on 25 Mar 2014 at 04:42PM.)    23:29   Result Comment troponin - RESULTS VERIFIED BY REPEAT TESTING.  - previous call:03/25/14 at 1642...tpl  Result(s) reported on 26 Mar 2014 at 12:54AM.  Glucose, Serum  159  BUN 17  Creatinine (comp)  1.36  Sodium, Serum 142  Potassium, Serum 3.7  Chloride, Serum  110  CO2, Serum 26  Calcium (Total), Serum 8.8  Anion Gap  6  Osmolality (calc) 288  eGFR (African American) >60  eGFR (Non-African American)  53 (eGFR values <677mmin/1.73 m2 may be an indication of chronic kidney disease (CKD). Calculated eGFR is useful in patients with stable renal function. The eGFR calculation will not be reliable in acutely ill patients when serum creatinine is changing rapidly. It is not useful in  patients on dialysis.  The eGFR calculation may not be applicable to patients at the low and high extremes of body sizes, pregnant women, and vegetarians.)  Cardiac:  11-Aug-15 15:57   Troponin I  0.11 (0.00-0.05 0.05 ng/mL or less: NEGATIVE  Repeat testing in 3-6 hrs  if clinically indicated. >0.05 ng/mL: POTENTIAL  MYOCARDIAL INJURY. Repeat  testing in 3-6 hrs if  clinically indicated. NOTE: An increase or decrease  of 30% or more on serial  testing suggests a  clinically important change)     20:05   Troponin I  0.11 (0.00-0.05 0.05 ng/mL or less: NEGATIVE  Repeat testing in 3-6 hrs  if clinically indicated. >0.05 ng/mL: POTENTIAL  MYOCARDIAL INJURY. Repeat  testing in 3-6 hrs if  clinically indicated. NOTE: An increase or decrease  of 30% or more on serial  testing suggests a  clinically important change)    23:29   Troponin I  0.11 (0.00-0.05 0.05 ng/mL or less: NEGATIVE  Repeat testing in 3-6 hrs  if clinically indicated. >0.05 ng/mL: POTENTIAL  MYOCARDIAL INJURY. Repeat  testing in 3-6 hrs if  clinically indicated. NOTE: An increase or decrease  of 30% or more on serial  testing suggests a  clinically important change)  Routine Coag:  11-Aug-15 15:57   Prothrombin 13.4  INR 1.0 (INR reference interval applies to patients on anticoagulant therapy. A single INR therapeutic range for coumarins is not optimal for all indications; however, the suggested range for most indications is 2.0 - 3.0. Exceptions to the INR Reference Range may include: Prosthetic heart valves, acute myocardial infarction, prevention of myocardial infarction, and combinations of aspirin and anticoagulant. The need for a higher or lower target INR must be assessed individually. Reference: The Pharmacology and Management of the Vitamin K  antagonists: the seventh ACCP Conference on Antithrombotic and Thrombolytic Therapy. NGEXB.2841 Sept:126 (3suppl): [id number]. A HCT value >55% may artifactually increase the PT.  In one study,  the increase was an average of 25%. Reference:  "Effect on Routine and Special Coagulation Testing Values of Citrate Anticoagulant Adjustment in Patients with High HCT Values." American Journal of Clinical Pathology 2006;126:400-405.)  Routine Hem:  11-Aug-15 15:57   WBC (CBC) 6.2  RBC (CBC)  3.95  Hemoglobin (CBC)  11.6  Hematocrit (CBC)  35.3  Platelet Count (CBC)  141 (Result(s) reported on 25 Mar 2014 at 04:15PM.)  MCV 90  MCH 29.5  MCHC 33.0  RDW   16.4   PERTINENT RADIOLOGY STUDIES: XRay:    11-Aug-15 16:19, Chest Portable Single View  Chest Portable Single View   REASON FOR EXAM:    chest pain  COMMENTS:       PROCEDURE: DXR - DXR PORTABLE CHEST SINGLE VIEW  - Mar 25 2014  4:19PM     CLINICAL DATA:  Chest pain    EXAM:  PORTABLE CHEST - 1 VIEW    COMPARISON:  12/20/2011    FINDINGS:  There is no focal parenchymal opacity, pleural effusion, or  pneumothorax. Mild stable cardiomegaly. Prior CABG. AICD is again  noted.    The osseous structures are unremarkable.     IMPRESSION:  No active disease.      Electronically Signed    By: Kathreen Devoid    On: 03/25/2014 16:23         Verified By: Jennette Banker, M.D., MD   Pertinent Past History:  Pertinent Past History 1.  Ischemic cardiomyopathy, status post defibrillator pacemaker with EF of 35%.  2.  Peripheral vascular  disease.  3.  Hyperlipidemia.  4.  Hypertension.  5.  Diabetes mellitus type 2.  6.  Chronic systolic CHF.  7.  History of cholestatic jaundice.   Hospital Course:  Hospital Course 1.  Left arm pain, unstable angina, similar to his prior heart attacks. The patient did have supraventricular tachycardia which could have caused his left arm pain, but this did not resolve in spite of the supraventricular tachycardia resolution. This did respond to the nitroglycerin. The patient has significant coronary artery disease on his last cardiac catheterization in 2014.    elevated troponin could be due to supraventricular tachycardia and demand ischemia from low blood pressure.    appreciated consult with Dr. Humphrey Rolls, his cardiologist. Continue his aspirin and statin and beta blocker    suggested to continue meds. given prescriptions- d/c home today.  2. Hypertension. Continue home medications. The patient was hypotensive by EMS , likely secondary from his supraventricular tachycardia. and getting NTG 3.  Chronic kidney disease III versus acute renal failure.  The patient's baseline creatinine seems to be around less than 1.  Presently at 1.5. It is unclear if he has developed chronic kidney disease stage III or has acute renal failure.  We will decrease his Lasix from 2 times a day to once a day at this point as his lungs are clear. Chest x-ray does not show any pulmonary edema. He does not have any orthopnea.  4.  Diabetes mellitus. on sliding scale insulin, home medications.  5.  Deep vein thrombosis prophylaxis with sequential compression devices. The patient does not tolerate any Lovenox or heparin because of internal bleeding per his family.   Condition on Discharge Stable   Code Status:  Code Status Full Code   DISCHARGE INSTRUCTIONS HOME MEDS:  Medication Reconciliation: Patient's Home Medications at Discharge:     Medication Instructions  nitroglycerin 0.1 mg/hr transdermal film, extended release  1 patch transdermal once a day   zolpidem 10 mg oral tablet  1 tab(s) orally once a day (at bedtime), As Needed   nitroglycerin 0.4 mg sublingual tablet  1 tab(s) sublingual every 5 minutes, As Needed   enalapril 2.5 mg oral tablet  1 tab(s) orally once a day   pravastatin 40 mg oral tablet  1 tab(s) orally once a day (at bedtime)   aspirin 81 mg oral tablet  1 tab(s) orally once a day   carvedilol 6.25 mg oral tablet  1 tab(s) orally 2 times a day   lasix 20 mg oral tablet  1 tab(s) orally once a day   omeprazole 40 mg oral delayed release capsule  1 cap(s) orally once a day    STOP TAKING THE FOLLOWING MEDICATION(S):    multivitamin: 1 tab(s) orally once a day  Physician's Instructions:  Diet Low Sodium  Low Fat, Low Cholesterol   Activity Limitations As tolerated   Return to Work Not Applicable   Time frame for Follow Up Appointment 1-2 weeks   Other Comments follow with Dr. Humphrey Rolls in clinic next week.   Electronic Signatures: Vaughan Basta (MD)  (Signed 17-Aug-15 14:04)  Authored: ADMISSION DATE AND DIAGNOSIS, CHIEF  COMPLAINT/HPI, Allergies, PERTINENT LABS, PERTINENT RADIOLOGY STUDIES, PERTINENT PAST HISTORY, HOSPITAL COURSE, DISCHARGE INSTRUCTIONS HOME MEDS, PATIENT INSTRUCTIONS   Last Updated: 17-Aug-15 14:04 by Vaughan Basta (MD)

## 2014-12-07 NOTE — Consult Note (Signed)
PATIENT NAME:  [name], [name] MR#:  [id number] DATE OF BIRTH:  September 07, 1945  DATE OF CONSULTATION:  12/20/2011  REFERRING PHYSICIAN:   CONSULTING PHYSICIAN:  Laurier NancyShaukat A. Mannie Ohlin, MD  INDICATION FOR CONSULTATION: Elevated troponin and chest pain.   HISTORY OF PRESENT ILLNESS: This is a 69 year old white male with a past medical history of PCI and stenting last year after inferior wall myocardial infarction [facility], status post coronary artery bypass graft in the 1990s, also [facility], who came in with chest pain and left arm pain. Left arm pain was throbbing in nature and associated with numbness and was relieved with nitroglycerin. He had mildly elevated troponin thus I was asked to evaluate the patient. The patient denies any chest pain or left arm numbness right now, but had it all night.  PAST MEDICAL HISTORY:  1. Hit [facility] CABG [facility]. 2. History of myocardial infarction [facility], most likely inferior wall myocardial infarction and says he had a stent put in at that time.  3. Last year had AICD implanted, also at [facility].  4. History of diabetes. 5. Hypercholesterolemia. 6. Peripheral vascular disease.   SOCIAL HISTORY: He continues to smoke 1 pack per day and occasionally drinks heavily.   FAMILY HISTORY: Positive for coronary artery disease. Mother had a myocardial infarction.  PHYSICAL EXAMINATION:   VITAL SIGNS: Temperature is 98, pulse 65, respirations 18, blood pressure 100/66, and pulse oximetry is 96.   NECK: Positive jugular venous distention.   LUNGS: Few rhonchi bilaterally. No rales.   HEART: Regular rate and rhythm. Normal S1 and S2. No audible murmur.   ABDOMEN: Soft and nontender, positive bowel sounds.   EXTREMITIES: No pedal edema.   NEUROLOGIC: Appears to be intact.   LABS/STUDIES: EKG shows AV sequential pacing at 90 beats per minute, old inferior wall myocardial infarction,  nonspecific ST-T changes.   BUN and creatinine was 20/1.58. Troponin was 0.06 and now is 0.08.   ASSESSMENT AND PLAN: The patient has atypical chest pain but mostly left arm pain which is usually angina for him. Prior to CABG and angioplasty he had left arm pain. He says he was diaphoretic when he was having this and was short of breath and in the setting of mildly elevated troponin and EKG, which is paced rhythm, I recommend that we do catheterization to further evaluate the cause of his symptoms. Thank you very much for the referral. ____________________________ Laurier NancyShaukat A. Cheria Sadiq, MD sak:slb D: 12/20/2011 09:21:34 ET T: 12/20/2011 09:45:13 ET JOB#: [id number]  cc: Laurier NancyShaukat A. Heaton Sarin, MD, <Dictator> Laurier NancySHAUKAT A Mindy Behnken MD ELECTRONICALLY SIGNED 12/20/2011 16:45

## 2014-12-07 NOTE — Discharge Summary (Signed)
PATIENT NAME:  [name], [name] MR#:  [id number] DATE OF BIRTH:  [birth year]  DATE OF ADMISSION:  12/20/2011 DATE OF DISCHARGE:  12/21/2011  DIAGNOSES:  1. Unstable angina/non-ST elevation myocardial infarction. 2. Coronary artery disease, status post coronary artery bypass graft.  3. Diabetes.  4. Hyperlipidemia. 5. Hypomagnesemia. 6. Peripheral vascular disease. 7. Ischemic cardiomyopathy, status post defibrillator.  8. Hypertension.   DISPOSITION: Patient is being discharged home.   FOLLOW UP: Follow up with Dr. Adrian BlackwaterShaukat Khan and PCP in 1 to 2 weeks after discharge.   DIET: Low sodium, ADA diet.   ACTIVITY: As tolerated.   DISCHARGE MEDICATIONS:  1. Metformin 1000 mg b.i.d.  2. Nitroglycerin 0.4 mg sublingual p.r.n. x3 doses. 3. Toprol-XL 50 mg daily.  4. Pravachol 40 mg daily.  5. Plavix 75 mg daily.  6. Omeprazole 40 mg daily.  7. Spironolactone 12.5 mg daily.  8. Glipizide 10 mg daily.  9. Ramipril 5 mg daily.  10. Lasix 20 mg daily.  11. Aspirin 81 mg daily.  12. Amiodarone 200 mg daily.  13. Imdur 30 mg daily.  14. Mag-Ox 500 mg b.i.d. for five days.   PROCEDURES: Cardiac catheterization which showed left main distal 50% blockage, left circumflex mid 40% restenosis in stent, LAD mid 100% stenosis, D1 mid 60% to 70% stenosis, RCA proximal 60% to 70% complex disease followed by distal 100% stenosis. Patient had a bypass in 1990 and had a repeat bypass done later on. Patient'Shah LIMA to LAD was still patent but both his grafts done on 09/29/2009 are occluded. Patient'Shah RCA lesion was too complex and diffuse and needed medical treatment. Patient had severe LV dysfunction with ejection fraction of 35%.   LABORATORY, DIAGNOSTIC, AND RADIOLOGICAL DATA: Troponins ranging from 0.08 to 0.09. Ejection fraction as per catheterization 35%. Chest x-ray: Cardiomegaly. No other acute abnormality. CBC essentially normal. Magnesium low, supplemented. Creatinine 1.58. Rest of complete metabolic  panel normal.   HOSPITAL COURSE: Patient is a 69 year old male with history of coronary artery disease status post bypass surgery x2, the last one was in May 2011, ischemic cardiomyopathy status post defibrillator, hypertension, diabetes, hyperlipidemia who presented with two episodes of left arm pain/numbness relieved by sublingual nitroglycerin. Patient was admitted with unstable angina/elevated troponin and treated with aspirin, Plavix, beta blocker, ACE inhibitor, statin, and nitro paste. Patient underwent a catheterization by Dr. Adrian BlackwaterShaukat Khan which showed multivessel coronary artery disease. Patient'Shah distal RCA was 100% occluded. As per Dr. Welton FlakesKhan the lesion was diffuse and not amenable to stenting. He recommended medical management. Patient continues to smoke and has been counseled about cessation. The rest of his medical problems remained stable. He was chest pain free by the time of discharge.   TIME SPENT: 45 minutes.   ____________________________ Darrick MeigsSangeeta Tylik Treese, MD sp:cms D: 12/21/2011 16:08:22 ET T: 12/22/2011 10:48:18 ET JOB#: [id number]  cc: Darrick MeigsSangeeta Imogene Gravelle, MD, <Dictator> Darrick MeigsSANGEETA Vicki Pasqual MD ELECTRONICALLY SIGNED 12/22/2011 14:13

## 2014-12-07 NOTE — H&P (Signed)
PATIENT NAME:  [name], [name] MR#:  [id number] DATE OF BIRTH:  [birth year]  DATE OF ADMISSION:  12/20/2011  PRIMARY CARE PHYSICIAN: He has no local doctor but he goes to [employer]    CHIEF COMPLAINT: Left arm pain relieved by sublingual nitroglycerin.   HISTORY OF PRESENT ILLNESS: Mr. Mangold is a 69 year old Caucasian male with history of coronary artery disease, status post coronary artery bypass graft in 1990, systemic hypertension, diabetes mellitus and hypercholesterolemia. The patient unfortunately continues to smoke despite his medical problems. He reported that this is the first time that he developed left arm pain described as numbness and tingling sensation feeling. The severity was about 7 on a scale of 10, lasted about 5 to 10 minutes during which he took sublingual nitroglycerin and that eased the pain. Later the pain came back again. At that time he decided to come to the Emergency Department for evaluation. Patient reports that earlier today before the left arm pain started he had profuse sweating but no actual chest pain. No vomiting. No shortness of breath. Evaluation here reveals slight elevation of troponin at 0.06. His EKG shows paced electronic rhythm. Patient was admitted for further evaluation of his angina equivalent pain.    REVIEW OF SYSTEMS: CONSTITUTIONAL: Denies any fever. No chills. No night sweats but he had profuse sweating prior to developing left arm pain. EYES: No blurring of vision. No double vision. ENT: No hearing impairment. No sore throat. No dysphagia. CARDIOVASCULAR: Reports no chest pain but had left arm pain. No shortness of breath. No edema. No syncope. RESPIRATORY: No shortness of breath. No cough. No sputum production. GASTROINTESTINAL: No abdominal pain. No vomiting. No diarrhea. GENITOURINARY: No dysuria. No frequency of urination. MUSCULOSKELETAL: No joint pain or swelling. No muscular pain or swelling. INTEGUMENTARY: No skin rash. No ulcers. NEUROLOGY: No  focal weakness. No seizure activity. No headache. PSYCHIATRY: No anxiety. No depression. ENDOCRINE: No polyuria or polydipsia. No heat or cold intolerance.   PAST MEDICAL HISTORY:  1. Coronary disease status post coronary artery bypass graft in 1990. 2. Systemic hypertension.  3. Diabetes mellitus, type 2. 4. Hyperlipidemia. 5. Peripheral vascular disease status post carotid stenosis and stent. 6. Congestive cardiomyopathy with ejection fraction about 30% to 45%. This is followed up at [facility]. Patient is status post cardioverter defibrillator implant and pacemaker.   PAST SURGICAL HISTORY:  1. Coronary artery bypass graft in 1990. 2. Carotid stent.  3. Cardioverter defibrillator implant and pacemaker insertion.   FAMILY HISTORY: His mother died at age of 35 from heart attack. He does not know any medical issues regarding his father.   SOCIAL HISTORY: He is retired from working in the [employer] work. He lives at home with his girlfriend.   SOCIAL HABITS: Chronic smoker, 1 pack per day since age of 24 and he continues to smoke. He drinks alcohol only once in a while. No drug abuse.   ADMISSION MEDICATIONS:  1. Toprol-XL 25 mg once a day.  2. Spironolactone 12.5 mg once a day.  3. Omeprazole 40 mg once a day. 4. Pravastatin 40 mg a day. 5. Metformin 1000 mg twice a day. 6. Ramipril 5 mg once a day. 7. Lasix 20 mg a day. 8. Glipizide XL 10 mg a day. 9. Plavix 75 mg a day. 10. Aspirin 81 mg a day. 11. Amiodarone 200 mg a day. 12. Sublingual nitroglycerin p.r.n.   ALLERGIES: No known drug allergies.   PHYSICAL EXAMINATION:  VITAL SIGNS: Blood pressure 100/69,  respiratory rate 18, pulse 90, temperature 98.3, oxygen saturation 96%.   GENERAL APPEARANCE: Elderly male laying in bed in no acute distress.   HEAD AND NECK EXAMINATION: No pallor. No icterus. No cyanosis.   EARS, NOSE, AND THROAT: Hearing was normal. Nasal mucosa, lips, tongue were normal. He is edentulous.    EYES: Normal eyelids and conjunctiva. Pupils about 5 mm, equal and reactive to light.   NECK: Supple. Trachea at midline. No thyromegaly. No cervical lymphadenopathy. No masses.   HEART: Normal S1, S2. No S3, S4. No murmur. No gallop. No carotid bruits. Defibrillator site is visible at the left upper chest area.   RESPIRATORY: Normal breathing pattern without use of accessory muscles. No rales. No wheezing.   ABDOMEN: Soft without tenderness. No hepatosplenomegaly. No masses. No hernias.   SKIN: No ulcers. No subcutaneous nodules.   MUSCULOSKELETAL: No joint swelling. No clubbing.   NEUROLOGIC: Cranial nerves II through XII are intact. No focal motor deficits.   PSYCHIATRIC: Patient is alert, oriented x3. Mood and affect were normal.   LABORATORY, DIAGNOSTIC, AND RADIOLOGICAL DATA: EKG showed electronic pacemaker rhythm at rate of 91 per minute. Serum glucose 298, BUN 20, creatinine 1.5, sodium 137, potassium 4.3. His liver function tests were normal. Troponin was elevated at 0.06. CBC showed white count of 9000, hemoglobin 13, hematocrit 39, platelet count 223.   ASSESSMENT:  1. Left arm pain with angina equivalent complaint reviewed by sublingual nitroglycerin. Question raised about non-ST elevation acute myocardial infarction although we cannot evaluate that adequately since there is a pacemaker rhythm.  2. Slight elevation of troponin. 3. Coronary artery disease, status post coronary artery bypass graft in 1990. 4. Ischemic congestive cardiomyopathy with reported ejection fraction 35% to 40% according to the patient. He is status post cardioverter defibrillator and pacemaker insertion.  5. Systemic hypertension.  6. Diabetes mellitus, type 2.  7. Hyperlipidemia.  8. Tobacco abuse.  9. Peripheral vascular disease. 10. Gastroesophageal reflux disease.   PLAN: Will admit the patient to telemetry. Follow p on cardiac enzymes. Continue antiplatelets with aspirin and Plavix. Add  Lovenox 80 mg twice a day. Continue beta blocker with metoprolol. Cardiology consultation. Sublingual nitroglycerin p.r.n. Patient was advised to quit smoking indefinitely. I placed him on nicotine patch. I discussed with the patient regarding LIVING WILL. He does not have a LIVING WILL but he appointed his girlfriend, her name is Tereasa CoopRose Marie Oakley, as the power of attorney.   TIME SPENT EVALUATING THIS PATIENT: Took more than one hour.   ____________________________ Carney CornersAmir M. Rudene Rearwish, MD amd:cms D: 12/20/2011 02:17:24 ET T: 12/20/2011 09:45:56 ET JOB#: [id number]  cc: Carney CornersAmir M. Rudene Rearwish, MD, <Dictator> Karolee OhsAMIR Dala DockM Anhad Sheeley MD ELECTRONICALLY SIGNED 12/21/2011 22:12

## 2016-02-22 ENCOUNTER — Encounter: Payer: Self-pay | Admitting: *Deleted

## 2016-02-23 ENCOUNTER — Encounter: Payer: Self-pay | Admitting: *Deleted

## 2016-02-23 ENCOUNTER — Ambulatory Visit: Payer: Commercial Managed Care - HMO | Admitting: Anesthesiology

## 2016-02-23 ENCOUNTER — Encounter
Admission: RE | Disposition: A | Discharge status: Home / Self Care | Payer: Self-pay | Source: Ambulatory Visit | Attending: Ophthalmology

## 2016-02-23 ENCOUNTER — Ambulatory Visit
Admission: RE | Admit: 2016-02-23 | Discharge: 2016-02-23 | Disposition: A | Discharge status: Home / Self Care | Payer: Commercial Managed Care - HMO | Source: Ambulatory Visit | Attending: Ophthalmology | Admitting: Ophthalmology

## 2016-02-23 DIAGNOSIS — Z8619 Personal history of other infectious and parasitic diseases: Secondary | ICD-10-CM | POA: Diagnosis not present

## 2016-02-23 DIAGNOSIS — Z79899 Other long term (current) drug therapy: Secondary | ICD-10-CM | POA: Diagnosis not present

## 2016-02-23 DIAGNOSIS — Z794 Long term (current) use of insulin: Secondary | ICD-10-CM | POA: Insufficient documentation

## 2016-02-23 DIAGNOSIS — E1122 Type 2 diabetes mellitus with diabetic chronic kidney disease: Secondary | ICD-10-CM | POA: Diagnosis not present

## 2016-02-23 DIAGNOSIS — Z955 Presence of coronary angioplasty implant and graft: Secondary | ICD-10-CM | POA: Diagnosis not present

## 2016-02-23 DIAGNOSIS — I252 Old myocardial infarction: Secondary | ICD-10-CM | POA: Diagnosis not present

## 2016-02-23 DIAGNOSIS — J449 Chronic obstructive pulmonary disease, unspecified: Secondary | ICD-10-CM | POA: Insufficient documentation

## 2016-02-23 DIAGNOSIS — I130 Hypertensive heart and chronic kidney disease with heart failure and stage 1 through stage 4 chronic kidney disease, or unspecified chronic kidney disease: Secondary | ICD-10-CM | POA: Insufficient documentation

## 2016-02-23 DIAGNOSIS — I2510 Atherosclerotic heart disease of native coronary artery without angina pectoris: Secondary | ICD-10-CM | POA: Diagnosis not present

## 2016-02-23 DIAGNOSIS — Z951 Presence of aortocoronary bypass graft: Secondary | ICD-10-CM | POA: Diagnosis not present

## 2016-02-23 DIAGNOSIS — H2512 Age-related nuclear cataract, left eye: Secondary | ICD-10-CM | POA: Insufficient documentation

## 2016-02-23 DIAGNOSIS — Z950 Presence of cardiac pacemaker: Secondary | ICD-10-CM | POA: Insufficient documentation

## 2016-02-23 DIAGNOSIS — F17210 Nicotine dependence, cigarettes, uncomplicated: Secondary | ICD-10-CM | POA: Diagnosis not present

## 2016-02-23 DIAGNOSIS — K219 Gastro-esophageal reflux disease without esophagitis: Secondary | ICD-10-CM | POA: Diagnosis not present

## 2016-02-23 DIAGNOSIS — N182 Chronic kidney disease, stage 2 (mild): Secondary | ICD-10-CM | POA: Diagnosis not present

## 2016-02-23 DIAGNOSIS — E7800 Pure hypercholesterolemia, unspecified: Secondary | ICD-10-CM | POA: Insufficient documentation

## 2016-02-23 DIAGNOSIS — I509 Heart failure, unspecified: Secondary | ICD-10-CM | POA: Diagnosis not present

## 2016-02-23 HISTORY — DX: Chronic kidney disease, unspecified: N18.9

## 2016-02-23 HISTORY — DX: Gastro-esophageal reflux disease without esophagitis: K21.9

## 2016-02-23 HISTORY — DX: Angina pectoris, unspecified: I20.9

## 2016-02-23 HISTORY — DX: Cough: R05

## 2016-02-23 HISTORY — DX: Pneumonia, unspecified organism: J18.9

## 2016-02-23 HISTORY — DX: Cardiac arrhythmia, unspecified: I49.9

## 2016-02-23 HISTORY — DX: Pure hypercholesterolemia, unspecified: E78.00

## 2016-02-23 HISTORY — DX: Heart failure, unspecified: I50.9

## 2016-02-23 HISTORY — DX: Essential (primary) hypertension: I10

## 2016-02-23 HISTORY — DX: Type 2 diabetes mellitus without complications: E11.9

## 2016-02-23 HISTORY — DX: Acute myocardial infarction, unspecified: I21.9

## 2016-02-23 HISTORY — DX: Other specified soft tissue disorders: M79.89

## 2016-02-23 HISTORY — DX: Chronic obstructive pulmonary disease, unspecified: J44.9

## 2016-02-23 HISTORY — DX: Presence of automatic (implantable) cardiac defibrillator: Z95.810

## 2016-02-23 HISTORY — DX: Chronic cough: R05.3

## 2016-02-23 HISTORY — PX: CATARACT EXTRACTION W/PHACO: SHX586

## 2016-02-23 HISTORY — DX: Inflammatory liver disease, unspecified: K75.9

## 2016-02-23 HISTORY — DX: Atherosclerotic heart disease of native coronary artery without angina pectoris: I25.10

## 2016-02-23 HISTORY — DX: Presence of cardiac pacemaker: Z95.0

## 2016-02-23 LAB — GLUCOSE, CAPILLARY: GLUCOSE-CAPILLARY: 148 mg/dL — AB (ref 65–99)

## 2016-02-23 SURGERY — PHACOEMULSIFICATION, CATARACT, WITH IOL INSERTION
Anesthesia: Monitor Anesthesia Care | Site: Eye | Laterality: Left | Wound class: Clean

## 2016-02-23 MED ORDER — POVIDONE-IODINE 5 % OP SOLN
OPHTHALMIC | Status: AC
  Administered 2016-02-23: 08:00:00
  Filled 2016-02-23: qty 30

## 2016-02-23 MED ORDER — ONDANSETRON HCL 4 MG/2ML IJ SOLN: 4.0000 mg | Freq: Once | INTRAMUSCULAR | Status: DC | PRN

## 2016-02-23 MED ORDER — TETRACAINE HCL 0.5 % OP SOLN: 1.0000 [drp] | OPHTHALMIC | Status: DC | PRN

## 2016-02-23 MED ORDER — ARMC OPHTHALMIC DILATING GEL: 1.0000 "application " | OPHTHALMIC | Status: DC | PRN

## 2016-02-23 MED ORDER — CEFUROXIME OPHTHALMIC INJECTION 1 MG/0.1 ML
INJECTION | OPHTHALMIC | Status: DC | PRN
  Administered 2016-02-23: 0.1 mL via INTRACAMERAL

## 2016-02-23 MED ORDER — MOXIFLOXACIN HCL 0.5 % OP SOLN
OPHTHALMIC | Status: AC
  Filled 2016-02-23: qty 3

## 2016-02-23 MED ORDER — CARVEDILOL 25 MG PO TABS: 25.0000 mg | ORAL_TABLET | Freq: Once | ORAL | Status: DC

## 2016-02-23 MED ORDER — CARBACHOL 0.01 % IO SOLN
INTRAOCULAR | Status: DC | PRN
  Administered 2016-02-23: 0.5 mL via INTRAOCULAR

## 2016-02-23 MED ORDER — EPINEPHRINE HCL 1 MG/ML IJ SOLN
INTRAMUSCULAR | Status: AC
  Filled 2016-02-23: qty 1

## 2016-02-23 MED ORDER — MIDAZOLAM HCL 2 MG/2ML IJ SOLN
INTRAMUSCULAR | Status: DC | PRN
Start: 2016-02-23 — End: 2016-02-23
  Administered 2016-02-23: 1 mg via INTRAVENOUS

## 2016-02-23 MED ORDER — SODIUM CHLORIDE 0.9 % IV SOLN
INTRAVENOUS | Status: DC
  Administered 2016-02-23: 50 mL/h via INTRAVENOUS
  Administered 2016-02-23: 09:00:00 via INTRAVENOUS

## 2016-02-23 MED ORDER — FENTANYL CITRATE (PF) 100 MCG/2ML IJ SOLN: 25.0000 ug | INTRAMUSCULAR | Status: DC | PRN

## 2016-02-23 MED ORDER — ARMC OPHTHALMIC DILATING GEL
OPHTHALMIC | Status: AC
  Administered 2016-02-23: 1
  Filled 2016-02-23: qty 0.25

## 2016-02-23 MED ORDER — FENTANYL CITRATE (PF) 100 MCG/2ML IJ SOLN
INTRAMUSCULAR | Status: DC | PRN
  Administered 2016-02-23: 50 ug via INTRAVENOUS

## 2016-02-23 MED ORDER — MOXIFLOXACIN HCL 0.5 % OP SOLN: 1.0000 [drp] | OPHTHALMIC | Status: DC | PRN

## 2016-02-23 MED ORDER — NA CHONDROIT SULF-NA HYALURON 40-17 MG/ML IO SOLN
INTRAOCULAR | Status: DC | PRN
  Administered 2016-02-23: 1 mL via INTRAOCULAR

## 2016-02-23 MED ORDER — POVIDONE-IODINE 5 % OP SOLN: 1.0000 "application " | OPHTHALMIC | Status: DC | PRN

## 2016-02-23 MED ORDER — MOXIFLOXACIN HCL 0.5 % OP SOLN
OPHTHALMIC | Status: DC | PRN
  Administered 2016-02-23: 1 [drp] via OPHTHALMIC

## 2016-02-23 MED ORDER — TETRACAINE HCL 0.5 % OP SOLN
OPHTHALMIC | Status: AC
  Administered 2016-02-23: 08:00:00
  Filled 2016-02-23: qty 2

## 2016-02-23 MED ORDER — NA CHONDROIT SULF-NA HYALURON 40-17 MG/ML IO SOLN
INTRAOCULAR | Status: AC
  Filled 2016-02-23: qty 1

## 2016-02-23 MED ORDER — CEFUROXIME OPHTHALMIC INJECTION 1 MG/0.1 ML
INJECTION | OPHTHALMIC | Status: AC
  Filled 2016-02-23: qty 0.1

## 2016-02-23 MED ORDER — EPINEPHRINE HCL 1 MG/ML IJ SOLN
INTRAOCULAR | Status: DC | PRN
  Administered 2016-02-23: 09:00:00 via OPHTHALMIC

## 2016-02-23 MED ORDER — CARVEDILOL 25 MG PO TABS
ORAL_TABLET | ORAL | Status: AC
  Administered 2016-02-23: 25 mg
  Filled 2016-02-23: qty 1

## 2016-02-23 SURGICAL SUPPLY — 21 items
CANNULA ANT/CHMB 27GA (MISCELLANEOUS) ×2 IMPLANT
CUP MEDICINE 2OZ PLAST GRAD ST (MISCELLANEOUS) ×2 IMPLANT
GLOVE BIO SURGEON STRL SZ8 (GLOVE) ×2 IMPLANT
GLOVE BIOGEL M 6.5 STRL (GLOVE) ×2 IMPLANT
GLOVE SURG LX 8.0 MICRO (GLOVE) ×1
GLOVE SURG LX STRL 8.0 MICRO (GLOVE) ×1 IMPLANT
GOWN STRL REUS W/ TWL LRG LVL3 (GOWN DISPOSABLE) ×2 IMPLANT
GOWN STRL REUS W/TWL LRG LVL3 (GOWN DISPOSABLE) ×2
LENS IOL TECNIS ITEC 21.5 (Intraocular Lens) ×2 IMPLANT
PACK CATARACT (MISCELLANEOUS) ×2 IMPLANT
PACK CATARACT BRASINGTON LX (MISCELLANEOUS) ×2 IMPLANT
PACK EYE AFTER SURG (MISCELLANEOUS) ×2 IMPLANT
SOL BSS BAG (MISCELLANEOUS) ×2
SOL PREP PVP 2OZ (MISCELLANEOUS) ×2
SOLUTION BSS BAG (MISCELLANEOUS) ×1 IMPLANT
SOLUTION PREP PVP 2OZ (MISCELLANEOUS) ×1 IMPLANT
SYR 3ML LL SCALE MARK (SYRINGE) ×2 IMPLANT
SYR 5ML LL (SYRINGE) ×2 IMPLANT
SYR TB 1ML 27GX1/2 LL (SYRINGE) ×2 IMPLANT
WATER STERILE IRR 1000ML POUR (IV SOLUTION) ×2 IMPLANT
WIPE NON LINTING 3.25X3.25 (MISCELLANEOUS) ×2 IMPLANT

## 2016-02-23 NOTE — Anesthesia Preprocedure Evaluation (Signed)
Anesthesia Evaluation  Patient identified by MRN, date of birth, ID band  Reviewed: Allergy & Precautions, NPO status , Patient's Chart, lab work & pertinent test results, reviewed documented beta blocker date and time   Airway Mallampati: II  TM Distance: >3 FB     Dental  (+) Upper Dentures, Lower Dentures   Pulmonary pneumonia, resolved, COPD, Current Smoker,     + wheezing      Cardiovascular hypertension, Pt. on medications and Pt. on home beta blockers + angina with exertion + CAD, + Past MI and +CHF  + dysrhythmias + pacemaker + Cardiac Defibrillator   Paced   Neuro/Psych negative neurological ROS  negative psych ROS   GI/Hepatic GERD  Medicated and Controlled,(+) Hepatitis -, Unspecified  Endo/Other  diabetes, Well Controlled, Type 2, Insulin Dependent  Renal/GU Renal InsufficiencyRenal disease     Musculoskeletal negative musculoskeletal ROS (+)   Abdominal Normal abdominal exam  (+)   Peds  Hematology negative hematology ROS (+)   Anesthesia Other Findings   Reproductive/Obstetrics                             Anesthesia Physical Anesthesia Plan  ASA: IV  Anesthesia Plan: MAC   Post-op Pain Management:    Induction: Intravenous  Airway Management Planned: Nasal Cannula  Additional Equipment:   Intra-op Plan:   Post-operative Plan:   Informed Consent: I have reviewed the patients History and Physical, chart, labs and discussed the procedure including the risks, benefits and alternatives for the proposed anesthesia with the patient or authorized representative who has indicated his/her understanding and acceptance.   Dental advisory given  Plan Discussed with: CRNA and Surgeon  Anesthesia Plan Comments:         Anesthesia Quick Evaluation

## 2016-02-23 NOTE — H&P (Signed)
  All labs reviewed. Abnormal studies sent to patients PCP when indicated.  Previous H&P reviewed, patient examined, there are NO CHANGES.  [name] LOUIS7/11/20178:32 AM

## 2016-02-23 NOTE — Op Note (Signed)
PREOPERATIVE DIAGNOSIS:  Nuclear sclerotic cataract of the left eye.   POSTOPERATIVE DIAGNOSIS:  NUCLEAR SCLEROTIC CATARACT LEFT EYE   OPERATIVE PROCEDURE:  Procedure(s): CATARACT EXTRACTION PHACO AND INTRAOCULAR LENS PLACEMENT (IOC)   SURGEON:  Galen ManilaWilliam Antonette Hendricks, MD.   ANESTHESIA:   Anesthesiologist: Yves DillPaul Carroll, MD CRNA: Evelena Peateborah Ferrero-Conover, CRNA  1.      Managed anesthesia care. 2.      Topical tetracaine drops followed by 2% Xylocaine jelly applied in the preoperative holding area.   COMPLICATIONS:  None.   TECHNIQUE:   Stop and chop   DESCRIPTION OF PROCEDURE:  The patient was examined and consented in the preoperative holding area where the aforementioned topical anesthesia was applied to the left eye and then brought back to the Operating Room where the left eye was prepped and draped in the usual sterile ophthalmic fashion and a lid speculum was placed. A paracentesis was created with the side port blade and the anterior chamber was filled with viscoelastic. A near clear corneal incision was performed with the steel keratome. A continuous curvilinear capsulorrhexis was performed with a cystotome followed by the capsulorrhexis forceps. Hydrodissection and hydrodelineation were carried out with BSS on a blunt cannula. The lens was removed in a stop and chop  technique and the remaining cortical material was removed with the irrigation-aspiration handpiece. The capsular bag was inflated with viscoelastic and the Technis [id number] lens was placed in the capsular bag without complication. The remaining viscoelastic was removed from the eye with the irrigation-aspiration handpiece. The wounds were hydrated. The anterior chamber was flushed with Miostat and the eye was inflated to physiologic pressure. 0.1 mL of cefuroxime concentration 10 mg/mL was placed in the anterior chamber. The wounds were found to be water tight. The eye was dressed with Vigamox. The patient was given protective glasses  to wear throughout the day and a shield with which to sleep tonight. The patient was also given drops with which to begin a drop regimen today and will follow-up with me in one day.  Implant Name Type Inv. Item Serial No. Manufacturer Lot No. LRB No. Used  LENS IOL DIOP 21.5 - [id number] Intraocular Lens LENS IOL DIOP 21.5 [id number]   Left 1   Procedure(s) with comments: CATARACT EXTRACTION PHACO AND INTRAOCULAR LENS PLACEMENT (IOC) (Left) - US 1.05 AP% 22.9 CDE 15.02 Fluid pack lot # [id number]  Electronically signed: Charde Macfarlane LOUIS 02/23/2016 8:58 AM

## 2016-02-23 NOTE — Transfer of Care (Signed)
Immediate Anesthesia Transfer of Care Note  Patient: [name]  Procedure(s) Performed: Procedure(s) with comments: CATARACT EXTRACTION PHACO AND INTRAOCULAR LENS PLACEMENT (IOC) (Left) - US 1.05 AP% 22.9 CDE 15.02 Fluid pack lot # [id number]  Patient Location: PACU  Anesthesia Type:MAC  Level of Consciousness: awake, alert  and oriented  Airway & Oxygen Therapy: Patient Spontanous Breathing  Post-op Assessment: Report given to RN and Post -op Vital signs reviewed and stable  Post vital signs: Reviewed and stable  Last Vitals:  Filed Vitals:   02/23/16 0818 02/23/16 0900  BP: 122/68 112/62  Pulse: 56 53  Temp: 36.1 C   Resp: 18 16    Last Pain: There were no vitals filed for this visit.    Patients Stated Pain Goal: 0 (02/23/16 0818)  Complications: No apparent anesthesia complications

## 2016-02-23 NOTE — Discharge Instructions (Signed)
Eye Surgery Discharge Instructions  Expect mild scratchy sensation or mild soreness. DO NOT RUB YOUR EYE!  The day of surgery:  Minimal physical activity, but bed rest is not required  No reading, computer work, or close hand work  No bending, lifting, or straining.  May watch TV  For 24 hours:  No driving, legal decisions, or alcoholic beverages  Safety precautions  Eat anything you prefer: It is better to start with liquids, then soup then solid foods.  _____ Eye patch should be worn until postoperative exam tomorrow.  ____ Solar shield eyeglasses should be worn for comfort in the sunlight/patch while sleeping  Resume all regular medications including aspirin or Coumadin if these were discontinued prior to surgery. You may shower, bathe, shave, or wash your hair. Tylenol may be taken for mild discomfort.  Call your doctor if you experience significant pain, nausea, or vomiting, fever > 101 or other signs of infection. [phone] or [phone] Specific instructions:  Follow-up Information    Follow up with Carlena BjornstadPORFILIO,WILLIAM LOUIS, MD On 02/24/2016.   Specialty:  Ophthalmology   Why:  10:00   Contact information:   [address] [place] [address] [phone]

## 2016-02-24 NOTE — Anesthesia Postprocedure Evaluation (Signed)
Anesthesia Post Note  Patient: [name]  Procedure(s) Performed: Procedure(s) (LRB): CATARACT EXTRACTION PHACO AND INTRAOCULAR LENS PLACEMENT (IOC) (Left)  Patient location during evaluation: PACU Anesthesia Type: MAC Level of consciousness: awake and alert Pain management: pain level controlled Vital Signs Assessment: post-procedure vital signs reviewed and stable Respiratory status: spontaneous breathing Cardiovascular status: blood pressure returned to baseline Anesthetic complications: no    Last Vitals:  Filed Vitals:   02/23/16 0920 02/23/16 0924  BP: 151/65 122/66  Pulse: 59 60  Temp: 36.6 C   Resp: 16 16    Last Pain:  Filed Vitals:   02/24/16 0853  PainSc: 0-No pain                 Carloyn Lahue

## 2016-03-16 ENCOUNTER — Emergency Department
Admission: EM | Admit: 2016-03-16 | Discharge: 2016-03-16 | Disposition: A | Discharge status: Home / Self Care | Payer: Commercial Managed Care - HMO | Attending: Emergency Medicine | Admitting: Emergency Medicine

## 2016-03-16 DIAGNOSIS — N189 Chronic kidney disease, unspecified: Secondary | ICD-10-CM | POA: Insufficient documentation

## 2016-03-16 DIAGNOSIS — L02213 Cutaneous abscess of chest wall: Secondary | ICD-10-CM | POA: Diagnosis not present

## 2016-03-16 DIAGNOSIS — E1122 Type 2 diabetes mellitus with diabetic chronic kidney disease: Secondary | ICD-10-CM | POA: Insufficient documentation

## 2016-03-16 DIAGNOSIS — I130 Hypertensive heart and chronic kidney disease with heart failure and stage 1 through stage 4 chronic kidney disease, or unspecified chronic kidney disease: Secondary | ICD-10-CM | POA: Diagnosis not present

## 2016-03-16 DIAGNOSIS — I2510 Atherosclerotic heart disease of native coronary artery without angina pectoris: Secondary | ICD-10-CM | POA: Insufficient documentation

## 2016-03-16 DIAGNOSIS — F17200 Nicotine dependence, unspecified, uncomplicated: Secondary | ICD-10-CM | POA: Insufficient documentation

## 2016-03-16 DIAGNOSIS — I509 Heart failure, unspecified: Secondary | ICD-10-CM | POA: Diagnosis not present

## 2016-03-16 DIAGNOSIS — L0291 Cutaneous abscess, unspecified: Secondary | ICD-10-CM

## 2016-03-16 DIAGNOSIS — Z951 Presence of aortocoronary bypass graft: Secondary | ICD-10-CM | POA: Diagnosis not present

## 2016-03-16 DIAGNOSIS — J449 Chronic obstructive pulmonary disease, unspecified: Secondary | ICD-10-CM | POA: Diagnosis not present

## 2016-03-16 DIAGNOSIS — Z7982 Long term (current) use of aspirin: Secondary | ICD-10-CM | POA: Diagnosis not present

## 2016-03-16 DIAGNOSIS — Z950 Presence of cardiac pacemaker: Secondary | ICD-10-CM | POA: Diagnosis not present

## 2016-03-16 DIAGNOSIS — Z794 Long term (current) use of insulin: Secondary | ICD-10-CM | POA: Insufficient documentation

## 2016-03-16 DIAGNOSIS — Z79899 Other long term (current) drug therapy: Secondary | ICD-10-CM | POA: Diagnosis not present

## 2016-03-16 MED ORDER — TRAMADOL HCL 50 MG PO TABS
50.0000 mg | ORAL_TABLET | Freq: Once | ORAL | Status: AC
  Administered 2016-03-16: 50 mg via ORAL
  Filled 2016-03-16: qty 1

## 2016-03-16 MED ORDER — SULFAMETHOXAZOLE-TRIMETHOPRIM 800-160 MG PO TABS
1.0000 | ORAL_TABLET | Freq: Once | ORAL | Status: AC
  Administered 2016-03-16: 1 via ORAL
  Filled 2016-03-16: qty 1

## 2016-03-16 MED ORDER — SULFAMETHOXAZOLE-TRIMETHOPRIM 800-160 MG PO TABS: 1.0000 | ORAL_TABLET | Freq: Two times a day (BID) | ORAL | 0 refills | Status: DC

## 2016-03-16 MED ORDER — LIDOCAINE HCL (PF) 1 % IJ SOLN
INTRAMUSCULAR | Status: AC
  Filled 2016-03-16: qty 5

## 2016-03-16 MED ORDER — TRAMADOL HCL 50 MG PO TABS: 50.0000 mg | ORAL_TABLET | Freq: Two times a day (BID) | ORAL | 0 refills | Status: DC | PRN

## 2016-03-16 NOTE — ED Provider Notes (Signed)
[facility] Emergency Department Provider Note   ____________________________________________   First MD Initiated Contact with Patient 03/16/16 1614     (approximate)  I have reviewed the triage vital signs and the nursing notes.   HISTORY  Chief Complaint Abscess    HPI [name] is a 70 y.o. male patient complaining of a nodule lesion distal left claviclethat is increased in size in the past 3 days. Patient suspect insect bite while working outside 4 days ago. Patient state the pain is dull aching sensation. He rates it as a 9/10. No palliative measures taken for this complaint.   Past Medical History:  Diagnosis Date  . AICD (automatic cardioverter/defibrillator) present 2010   needs to be changed out but patient unable to afford it at this time  . Anginal pain (HCC)   . Bilateral swelling of feet   . CHF (congestive heart failure) (HCC)   . Chronic cough   . Chronic kidney disease    renal insufficiency.stated on lasix  . COPD (chronic obstructive pulmonary disease) (HCC)    does not use o2  . Coronary artery disease   . Diabetes mellitus without complication (HCC)   . Dysrhythmia   . GERD (gastroesophageal reflux disease)   . Hepatitis   . Hypercholesteremia   . Hypertension   . Myocardial infarction (HCC) 1993   several  . Pneumonia   . Presence of permanent cardiac pacemaker    and defibrilator    There are no active problems to display for this patient.   Past Surgical History:  Procedure Laterality Date  . CARDIAC CATHETERIZATION     with stents  . CATARACT EXTRACTION W/PHACO Left 02/23/2016   Procedure: CATARACT EXTRACTION PHACO AND INTRAOCULAR LENS PLACEMENT (IOC);  Surgeon: Galen Manila, MD;  Location: [facility] ORS;  Service: Ophthalmology;  Laterality: Left;  [place] 1.05 AP% 22.9 CDE 15.02 Fluid pack lot # [id number]  . CHOLECYSTECTOMY    . CORONARY ARTERY BYPASS GRAFT    . PANCREATIC CYST EXCISION     had hepatitis  at this time    Prior to Admission medications   Medication Sig Start Date End Date Taking? Authorizing Provider  aspirin EC 81 MG tablet Take 81 mg by mouth daily.    Historical Provider, MD  carvedilol (COREG) 25 MG tablet Take 25 mg by mouth 2 (two) times daily with a meal.    Historical Provider, MD  digoxin (LANOXIN) 0.125 MG tablet Take 0.125 mg by mouth daily.    Historical Provider, MD  furosemide (LASIX) 20 MG tablet Take 20 mg by mouth daily.    Historical Provider, MD  insulin glargine (LANTUS) 100 UNIT/ML injection Inject 30 Units into the skin at bedtime.    Historical Provider, MD  Melatonin 10 MG TABS Take 10 mg by mouth daily.    Historical Provider, MD  Multiple Vitamin (MULTIVITAMIN) capsule Take 1 capsule by mouth daily.    Historical Provider, MD  nitroGLYCERIN (NITRODUR - DOSED IN MG/24 HR) 0.1 mg/hr patch Place 0.1 mg onto the skin daily.    Historical Provider, MD  nitroGLYCERIN (NITROSTAT) 0.3 MG SL tablet Place 0.3 mg under the tongue every 5 (five) minutes as needed for chest pain.    Historical Provider, MD  omeprazole (PRILOSEC) 40 MG capsule Take 40 mg by mouth daily.    Historical Provider, MD  pravastatin (PRAVACHOL) 40 MG tablet Take 40 mg by mouth at bedtime.    Historical Provider, MD  sulfamethoxazole-trimethoprim (  BACTRIM DS,SEPTRA DS) 800-160 MG tablet Take 1 tablet by mouth 2 (two) times daily. 03/16/16   Joni Reining, PA-C  traMADol (ULTRAM) 50 MG tablet Take 1 tablet (50 mg total) by mouth every 12 (twelve) hours as needed for moderate pain. 03/16/16   Joni Reining, PA-C    Allergies Other  No family history on file.  Social History Social History  Substance Use Topics  . Smoking status: Current Every Day Smoker    Packs/day: 0.25    Years: 55.00  . Smokeless tobacco: Current User  . Alcohol use No    Review of Systems Constitutional: No fever/chills Eyes: No visual changes. ENT: No sore throat. Cardiovascular: Denies chest  pain. Respiratory: Denies shortness of breath. Gastrointestinal: No abdominal pain.  No nausea, no vomiting.  No diarrhea.  No constipation. Genitourinary: Negative for dysuria. Musculoskeletal: Negative for back pain. Skin: Negative for rash. Erythematous nodule lesion distal left clavicle Neurological: Negative for headaches, focal weakness or numbness. Endocrine:Diabetes, hypertension, and hyperlipidemia. Hematological/Lymphatic: Allergic/Immunilogical: **} 10-point ROS otherwise negative.  ____________________________________________   PHYSICAL EXAM:  VITAL SIGNS: ED Triage Vitals  Enc Vitals Group     BP 03/16/16 1604 (!) 119/58     Pulse Rate 03/16/16 1604 65     Resp 03/16/16 1604 16     Temp 03/16/16 1605 97.9 F (36.6 C)     Temp Source 03/16/16 1604 Oral     SpO2 03/16/16 1604 98 %     Weight 03/16/16 1604 149 lb (67.6 kg)     Height 03/16/16 1604 5\' 6"  (1.676 m)     Head Circumference --      Peak Flow --      Pain Score 03/16/16 1605 9     Pain Loc --      Pain Edu? --      Excl. in GC? --     Constitutional: Alert and oriented. Well appearing and in no acute distress. Eyes: Conjunctivae are normal. PERRL. EOMI. Head: Atraumatic. Nose: No congestion/rhinnorhea. Mouth/Throat: Mucous membranes are moist.  Oropharynx non-erythematous. Neck: No stridor.  No cervical spine tenderness to palpation. Hematological/Lymphatic/Immunilogical: No cervical lymphadenopathy. Cardiovascular: Normal rate, regular rhythm. Grossly normal heart sounds.  Good peripheral circulation. Respiratory: Normal respiratory effort.  No retractions. Lungs CTAB. Gastrointestinal: Soft and nontender. No distention. No abdominal bruits. No CVA tenderness. Musculoskeletal: No lower extremity tenderness nor edema.  No joint effusions. Neurologic:  Normal speech and language. No gross focal neurologic deficits are appreciated. No gait instability. Skin:  Skin is warm, dry and intact. No rash  noted. Erythematous nodule lesion consistent with abscess. Psychiatric: Mood and affect are normal. Speech and behavior are normal.  ____________________________________________   LABS (all labs ordered are listed, but only abnormal results are displayed)  Labs Reviewed - No data to display ____________________________________________  EKG   ____________________________________________  RADIOLOGY   ____________________________________________   PROCEDURES  Procedure(s) performed: INCISION AND DRAINAGE Performed by: Joni Reining Consent: Verbal consent obtained. Risks and benefits: risks, benefits and alternatives were discussed Type: abscess  Body area: Left distal clavicle Anesthesia: local infiltration  Incision was made with a scalpel.  Local anesthetic: lidocaine 1% with   Anesthetic total: 3 ml  Complexity: complex Blunt dissection to break up loculations  Drainage: purulent  Drainage amount: Fall  Patient tolerance: Patient tolerated the procedure well with no immediate complications.     Procedures  Critical Care performed: No  ____________________________________________   INITIAL IMPRESSION / ASSESSMENT AND  PLAN / ED COURSE  Pertinent labs & imaging results that were available during my care of the patient were reviewed by me and considered in my medical decision making (see chart for details).  Abscess left clavicle. Patient given discharge care instructions status post I&D. Patient given a prescription for Bactrim DS and tramadol. Patient advised to follow-up with family doctor as needed. Return back if condition worsens.  Clinical Course     ____________________________________________   FINAL CLINICAL IMPRESSION(S) / ED DIAGNOSES  Final diagnoses:  Abscess      NEW MEDICATIONS STARTED DURING THIS VISIT:  New Prescriptions   SULFAMETHOXAZOLE-TRIMETHOPRIM (BACTRIM DS,SEPTRA DS) 800-160 MG TABLET    Take 1 tablet by mouth 2  (two) times daily.   TRAMADOL (ULTRAM) 50 MG TABLET    Take 1 tablet (50 mg total) by mouth every 12 (twelve) hours as needed for moderate pain.     Note:  This document was prepared using Dragon voice recognition software and may include unintentional dictation errors.    Joni Reining, PA-C 03/16/16 1644    Jennye Moccasin, MD 03/16/16 [phone]

## 2016-03-16 NOTE — ED Triage Notes (Signed)
Pt reports large insect bite to left clavicle, appears to be abscessed.

## 2016-03-16 NOTE — ED Notes (Signed)
Possible abscess area to left anterior shoulder  Stats he thinks he was bitten by something this past weekend

## 2016-04-19 ENCOUNTER — Encounter: Admission: RE | Payer: Self-pay | Source: Ambulatory Visit

## 2016-04-19 ENCOUNTER — Ambulatory Visit
Admission: RE | Admit: 2016-04-19 | Payer: Commercial Managed Care - HMO | Source: Ambulatory Visit | Admitting: Ophthalmology

## 2016-04-19 SURGERY — PHACOEMULSIFICATION, CATARACT, WITH IOL INSERTION
Anesthesia: Choice | Laterality: Right

## 2016-07-15 ENCOUNTER — Emergency Department
Admission: EM | Admit: 2016-07-15 | Discharge: 2016-07-15 | Disposition: A | Discharge status: Home / Self Care | Payer: Medicare PPO | Attending: Emergency Medicine | Admitting: Emergency Medicine

## 2016-07-15 DIAGNOSIS — I509 Heart failure, unspecified: Secondary | ICD-10-CM | POA: Diagnosis not present

## 2016-07-15 DIAGNOSIS — Z5321 Procedure and treatment not carried out due to patient leaving prior to being seen by health care provider: Secondary | ICD-10-CM | POA: Diagnosis not present

## 2016-07-15 DIAGNOSIS — J449 Chronic obstructive pulmonary disease, unspecified: Secondary | ICD-10-CM | POA: Insufficient documentation

## 2016-07-15 DIAGNOSIS — F17200 Nicotine dependence, unspecified, uncomplicated: Secondary | ICD-10-CM | POA: Diagnosis not present

## 2016-07-15 DIAGNOSIS — I2510 Atherosclerotic heart disease of native coronary artery without angina pectoris: Secondary | ICD-10-CM | POA: Diagnosis not present

## 2016-07-15 DIAGNOSIS — N189 Chronic kidney disease, unspecified: Secondary | ICD-10-CM | POA: Insufficient documentation

## 2016-07-15 DIAGNOSIS — E1122 Type 2 diabetes mellitus with diabetic chronic kidney disease: Secondary | ICD-10-CM | POA: Insufficient documentation

## 2016-07-15 DIAGNOSIS — R21 Rash and other nonspecific skin eruption: Secondary | ICD-10-CM | POA: Diagnosis present

## 2016-07-15 DIAGNOSIS — Z7982 Long term (current) use of aspirin: Secondary | ICD-10-CM | POA: Diagnosis not present

## 2016-07-15 DIAGNOSIS — Z794 Long term (current) use of insulin: Secondary | ICD-10-CM | POA: Diagnosis not present

## 2016-07-15 DIAGNOSIS — I130 Hypertensive heart and chronic kidney disease with heart failure and stage 1 through stage 4 chronic kidney disease, or unspecified chronic kidney disease: Secondary | ICD-10-CM | POA: Diagnosis not present

## 2016-07-15 NOTE — ED Triage Notes (Signed)
Pt in with co rash to lower legs for a few days, unsure of cause. States it itches and has been working outside on [employer].

## 2016-07-16 ENCOUNTER — Encounter: Payer: Self-pay | Admitting: Emergency Medicine

## 2016-07-16 ENCOUNTER — Emergency Department
Admission: EM | Admit: 2016-07-16 | Discharge: 2016-07-16 | Disposition: A | Discharge status: Home / Self Care | Payer: Medicare PPO | Attending: Emergency Medicine | Admitting: Emergency Medicine

## 2016-07-16 DIAGNOSIS — I509 Heart failure, unspecified: Secondary | ICD-10-CM | POA: Insufficient documentation

## 2016-07-16 DIAGNOSIS — I252 Old myocardial infarction: Secondary | ICD-10-CM | POA: Insufficient documentation

## 2016-07-16 DIAGNOSIS — I130 Hypertensive heart and chronic kidney disease with heart failure and stage 1 through stage 4 chronic kidney disease, or unspecified chronic kidney disease: Secondary | ICD-10-CM | POA: Insufficient documentation

## 2016-07-16 DIAGNOSIS — Z79899 Other long term (current) drug therapy: Secondary | ICD-10-CM | POA: Insufficient documentation

## 2016-07-16 DIAGNOSIS — Z794 Long term (current) use of insulin: Secondary | ICD-10-CM | POA: Insufficient documentation

## 2016-07-16 DIAGNOSIS — R21 Rash and other nonspecific skin eruption: Secondary | ICD-10-CM | POA: Insufficient documentation

## 2016-07-16 DIAGNOSIS — N189 Chronic kidney disease, unspecified: Secondary | ICD-10-CM | POA: Insufficient documentation

## 2016-07-16 DIAGNOSIS — I2510 Atherosclerotic heart disease of native coronary artery without angina pectoris: Secondary | ICD-10-CM | POA: Diagnosis not present

## 2016-07-16 DIAGNOSIS — J449 Chronic obstructive pulmonary disease, unspecified: Secondary | ICD-10-CM | POA: Diagnosis not present

## 2016-07-16 DIAGNOSIS — Z7982 Long term (current) use of aspirin: Secondary | ICD-10-CM | POA: Insufficient documentation

## 2016-07-16 DIAGNOSIS — E1122 Type 2 diabetes mellitus with diabetic chronic kidney disease: Secondary | ICD-10-CM | POA: Insufficient documentation

## 2016-07-16 DIAGNOSIS — F17200 Nicotine dependence, unspecified, uncomplicated: Secondary | ICD-10-CM | POA: Insufficient documentation

## 2016-07-16 MED ORDER — HYDROXYZINE PAMOATE 25 MG PO CAPS: 25.0000 mg | ORAL_CAPSULE | Freq: Four times a day (QID) | ORAL | 0 refills | Status: DC | PRN

## 2016-07-16 MED ORDER — PERMETHRIN 5 % EX CREA: TOPICAL_CREAM | CUTANEOUS | 1 refills | Status: AC

## 2016-07-16 NOTE — ED Triage Notes (Signed)
Pt c/o rash to lower legs, arms and back for the past week, pt states it itches and has been working outside frequently.  No drainage noted at this time.  Benadryl used as well as a cream that he is not sure the name of. Was here yesterday AM but LWBS after triage.

## 2016-07-16 NOTE — ED Provider Notes (Signed)
[facility]-EMERGENCY DEPARTMENT Provider Note   CSN: [id number] Arrival date & time: 07/16/16  1820     History   Chief Complaint Chief Complaint  Patient presents with  . Rash    HPI [name] is a 70 y.o. male presents to the emergency department for evaluation of skin rash. Patient states for 2-3 days he's had a erythematous pruritic rash mostly on his back and some on his lower legs and arms and right hand. He denies any facial swelling, edema, difficulty swallowing or difficulty breathing. He has tried Benadryl with some improvement. He is also tried topical anti-itch cream with some improvement. He denies any pain, fevers. He states his wife did recently changed detergents. He states he came in today because his wife had a recent bout with scabies, and stated his rash looked similar. Patient does not sleep with his wife. He has not slept in any new places recently.  HPI  Past Medical History:  Diagnosis Date  . AICD (automatic cardioverter/defibrillator) present 2010   needs to be changed out but patient unable to afford it at this time  . Anginal pain (HCC)   . Bilateral swelling of feet   . CHF (congestive heart failure) (HCC)   . Chronic cough   . Chronic kidney disease    renal insufficiency.stated on lasix  . COPD (chronic obstructive pulmonary disease) (HCC)    does not use o2  . Coronary artery disease   . Diabetes mellitus without complication (HCC)   . Dysrhythmia   . GERD (gastroesophageal reflux disease)   . Hepatitis   . Hypercholesteremia   . Hypertension   . Myocardial infarction 1993   several  . Pneumonia   . Presence of permanent cardiac pacemaker    and defibrilator    There are no active problems to display for this patient.   Past Surgical History:  Procedure Laterality Date  . CARDIAC CATHETERIZATION     with stents  . CATARACT EXTRACTION W/PHACO Left 02/23/2016   Procedure: CATARACT EXTRACTION PHACO AND INTRAOCULAR LENS PLACEMENT (IOC);   Surgeon: Galen ManilaWilliam Porfilio, MD;  Location: [facility] ORS;  Service: Ophthalmology;  Laterality: Left;  US 1.05 AP% 22.9 CDE 15.02 Fluid pack lot # [id number]  . CHOLECYSTECTOMY    . CORONARY ARTERY BYPASS GRAFT    . PANCREATIC CYST EXCISION     had hepatitis at this time       Home Medications    Prior to Admission medications   Medication Sig Start Date End Date Taking? Authorizing Provider  aspirin EC 81 MG tablet Take 81 mg by mouth daily.    Historical Provider, MD  carvedilol (COREG) 25 MG tablet Take 25 mg by mouth 2 (two) times daily with a meal.    Historical Provider, MD  digoxin (LANOXIN) 0.125 MG tablet Take 0.125 mg by mouth daily.    Historical Provider, MD  furosemide (LASIX) 20 MG tablet Take 20 mg by mouth daily.    Historical Provider, MD  hydrOXYzine (VISTARIL) 25 MG capsule Take 1 capsule (25 mg total) by mouth every 6 (six) hours as needed. 07/16/16   Evon Slackhomas C Kentrell Hallahan, PA-C  insulin glargine (LANTUS) 100 UNIT/ML injection Inject 30 Units into the skin at bedtime.    Historical Provider, MD  Melatonin 10 MG TABS Take 10 mg by mouth daily.    Historical Provider, MD  Multiple Vitamin (MULTIVITAMIN) capsule Take 1 capsule by mouth daily.    Historical Provider, MD  nitroGLYCERIN (NITRODUR -  DOSED IN MG/24 HR) 0.1 mg/hr patch Place 0.1 mg onto the skin daily.    Historical Provider, MD  nitroGLYCERIN (NITROSTAT) 0.3 MG SL tablet Place 0.3 mg under the tongue every 5 (five) minutes as needed for chest pain.    Historical Provider, MD  omeprazole (PRILOSEC) 40 MG capsule Take 40 mg by mouth daily.    Historical Provider, MD  permethrin (ELIMITE) 5 % cream Apply from the chin down, leave on for 8-14 hours, rinse 1. 07/16/16 07/16/17  Evon Slackhomas C Greyson Peavy, PA-C  pravastatin (PRAVACHOL) 40 MG tablet Take 40 mg by mouth at bedtime.    Historical Provider, MD  sulfamethoxazole-trimethoprim (BACTRIM DS,SEPTRA DS) 800-160 MG tablet Take 1 tablet by mouth 2 (two) times daily. 03/16/16   Joni Reiningonald K  Smith, PA-C  traMADol (ULTRAM) 50 MG tablet Take 1 tablet (50 mg total) by mouth every 12 (twelve) hours as needed for moderate pain. 03/16/16   Joni Reiningonald K Smith, PA-C    Family History History reviewed. No pertinent family history.  Social History Social History  Substance Use Topics  . Smoking status: Current Every Day Smoker    Packs/day: 0.25    Years: 55.00  . Smokeless tobacco: Current User  . Alcohol use No     Allergies   Other   Review of Systems Review of Systems  Constitutional: Negative for activity change, appetite change, chills, diaphoresis, fatigue and fever.  HENT: Negative for congestion, ear pain, mouth sores, rhinorrhea, sinus pressure, sore throat and trouble swallowing.   Eyes: Negative for photophobia, pain and discharge.  Respiratory: Negative for cough, chest tightness and shortness of breath.   Cardiovascular: Negative for chest pain and leg swelling.  Gastrointestinal: Negative for abdominal distention, abdominal pain, diarrhea, nausea and vomiting.  Genitourinary: Negative for difficulty urinating and dysuria.  Musculoskeletal: Negative for arthralgias, back pain and gait problem.  Skin: Positive for rash. Negative for color change.  Neurological: Negative for dizziness and headaches.  Hematological: Negative for adenopathy.  Psychiatric/Behavioral: Negative for agitation and behavioral problems.     Physical Exam Updated Vital Signs BP 125/70 (BP Location: Left Arm)   Pulse 60   Temp 97.3 F (36.3 C) (Oral)   Resp 18   Ht 5\' 5"  (1.651 m)   Wt 68 kg   SpO2 100%   BMI 24.96 kg/m   Physical Exam  Constitutional: He appears well-developed and well-nourished.  HENT:  Head: Normocephalic and atraumatic.  Right Ear: External ear normal.  Left Ear: External ear normal.  Nose: Nose normal.  Mouth/Throat: Oropharynx is clear and moist. No oropharyngeal exudate.  No facial swelling or signs of angioedema.  Eyes: Conjunctivae and EOM are  normal. Right eye exhibits no discharge. Left eye exhibits no discharge.  Neck: Normal range of motion. Neck supple.  Cardiovascular: Normal rate and regular rhythm.   No murmur heard. Pulmonary/Chest: Effort normal and breath sounds normal. No respiratory distress. He has no wheezes. He has no rales. He exhibits no tenderness.  Abdominal: Soft. There is no tenderness. There is no guarding.  Musculoskeletal: He exhibits no edema.  Lymphadenopathy:    He has no cervical adenopathy.  Neurological: He is alert.  Skin: Skin is warm and dry.  Patient with diffuse macular papular rash along the back, left and right forearm and lower legs from the knee to the ankle. There are a few bumps on the right hand that are not between the digits. A few of the papules have centralized black dot. There  is no warmth. Patient is nontender to palpation.  Psychiatric: He has a normal mood and affect.  Nursing note and vitals reviewed.    ED Treatments / Results  Labs (all labs ordered are listed, but only abnormal results are displayed) Labs Reviewed - No data to display  EKG  EKG Interpretation None       Radiology No results found.  Procedures Procedures (including critical care time)  Medications Ordered in ED Medications - No data to display   Initial Impression / Assessment and Plan / ED Course  I have reviewed the triage vital signs and the nursing notes.  Pertinent labs & imaging results that were available during my care of the patient were reviewed by me and considered in my medical decision making (see chart for details).  Clinical Course     71 year old male with a skin rash. Possible component of contact dermatitis due to new change in detergent but some of the lesions appear to be excoriated with centralized black dot concerning for possible scabies. We'll treat for scabies and also give antihistamine medication. Patient will discontinue new detergent. He is educated on signs  and symptoms to return to the emergency department for. He is educated on proper use of permethrin and household cleaning of scabies.  Final Clinical Impressions(s) / ED Diagnoses   Final diagnoses:  Skin rash    New Prescriptions New Prescriptions   HYDROXYZINE (VISTARIL) 25 MG CAPSULE    Take 1 capsule (25 mg total) by mouth every 6 (six) hours as needed.   PERMETHRIN (ELIMITE) 5 % CREAM    Apply from the chin down, leave on for 8-14 hours, rinse 1.     Evon Slack, PA-C 07/16/16 1931    Sharman Cheek, MD 07/16/16 2117

## 2016-07-16 NOTE — Discharge Instructions (Signed)
Please apply permethrin cream to your body from the chin down to the feet times one, allow medications to absorb for 10-14 hours and then rinse in the shower. Please clean all of your linens. Please change your detergent. Return to the ER for any worsening symptoms urgent changes in her health.

## 2016-07-16 NOTE — ED Notes (Signed)

## 2017-04-21 ENCOUNTER — Ambulatory Visit (INDEPENDENT_AMBULATORY_CARE_PROVIDER_SITE_OTHER): Payer: Medicare PPO | Admitting: Vascular Surgery

## 2017-04-21 ENCOUNTER — Encounter (INDEPENDENT_AMBULATORY_CARE_PROVIDER_SITE_OTHER): Payer: Self-pay | Admitting: Vascular Surgery

## 2017-04-21 ENCOUNTER — Other Ambulatory Visit (INDEPENDENT_AMBULATORY_CARE_PROVIDER_SITE_OTHER): Payer: Self-pay

## 2017-04-21 ENCOUNTER — Encounter (INDEPENDENT_AMBULATORY_CARE_PROVIDER_SITE_OTHER): Payer: Self-pay

## 2017-04-21 VITALS — BP 95/61 | HR 46 | Resp 14 | Ht 65.0 in | Wt 137.0 lb

## 2017-04-21 DIAGNOSIS — I10 Essential (primary) hypertension: Secondary | ICD-10-CM

## 2017-04-21 DIAGNOSIS — F17200 Nicotine dependence, unspecified, uncomplicated: Secondary | ICD-10-CM | POA: Insufficient documentation

## 2017-04-21 DIAGNOSIS — F17210 Nicotine dependence, cigarettes, uncomplicated: Secondary | ICD-10-CM

## 2017-04-21 DIAGNOSIS — I70212 Atherosclerosis of native arteries of extremities with intermittent claudication, left leg: Secondary | ICD-10-CM

## 2017-04-21 DIAGNOSIS — I70219 Atherosclerosis of native arteries of extremities with intermittent claudication, unspecified extremity: Secondary | ICD-10-CM | POA: Insufficient documentation

## 2017-04-21 DIAGNOSIS — E119 Type 2 diabetes mellitus without complications: Secondary | ICD-10-CM

## 2017-04-21 NOTE — Progress Notes (Signed)
Patient ID: [name], male   DOB: Jul 31, 1946, 71 y.o.   MRN: [id number]  Chief Complaint  Patient presents with  . New Patient (Initial Visit)    Abnormal ABI    HPI [name] is a 71 y.o. male.  I am asked to see the patient by Dr. Dareen Piano for evaluation of PAD.  The patient reports Pain in his left foot and in particular his left great toe. Reports no trauma, injury, or inciting event that started the symptoms. He says he can only walk very short distances before his left leg and foot hurt too bad. He does not have open ulceration or infection. He has an extensive atherosclerotic history and has had 10 heart attacks multiple previous coronary interventions including bypass. He has ongoing tobacco use and multiple other atherosclerotic risk factors. He says his right leg does not bother him much. Nothing really seems to have made it much better. He denies fevers or chills. No chest pain or shortness of breath.   Past Medical History:  Diagnosis Date  . AICD (automatic cardioverter/defibrillator) present 2010   needs to be changed out but patient unable to afford it at this time  . Anginal pain (HCC)   . Bilateral swelling of feet   . CHF (congestive heart failure) (HCC)   . Chronic cough   . Chronic kidney disease    renal insufficiency.stated on lasix  . COPD (chronic obstructive pulmonary disease) (HCC)    does not use o2  . Coronary artery disease   . Diabetes mellitus without complication (HCC)   . Dysrhythmia   . GERD (gastroesophageal reflux disease)   . Hepatitis   . Hypercholesteremia   . Hypertension   . Myocardial infarction (HCC) 1993   several  . Pneumonia   . Presence of permanent cardiac pacemaker    and defibrilator    Past Surgical History:  Procedure Laterality Date  . CARDIAC CATHETERIZATION     with stents  . CATARACT EXTRACTION W/PHACO Left 02/23/2016   Procedure: CATARACT EXTRACTION PHACO AND INTRAOCULAR LENS PLACEMENT (IOC);  Surgeon:  Galen Manila, MD;  Location: [facility] ORS;  Service: Ophthalmology;  Laterality: Left;  [place] 1.05 AP% 22.9 CDE 15.02 Fluid pack lot # [id number]  . CHOLECYSTECTOMY    . CORONARY ARTERY BYPASS GRAFT    . PANCREATIC CYST EXCISION     had hepatitis at this time    Family History No bleeding disorders, clotting disorders, autoimmune diseases, or aneurysms  Social History Social History  Substance Use Topics  . Smoking status: Current Every Day Smoker    Packs/day: 0.25    Years: 55.00  . Smokeless tobacco: Current User  . Alcohol use No  Married  Allergies  Allergen Reactions  . Other     Intolerant to blood thinners    Current Outpatient Prescriptions  Medication Sig Dispense Refill  . ACCU-CHEK SMARTVIEW test strip     . aspirin EC 81 MG tablet Take 81 mg by mouth daily.    . carvedilol (COREG) 25 MG tablet Take 25 mg by mouth 2 (two) times daily with a meal.    . cyanocobalamin (,VITAMIN B-12,) 1000 MCG/ML injection Inject into the muscle.    . digoxin (LANOXIN) 0.125 MG tablet Take 0.125 mg by mouth daily.    . furosemide (LASIX) 20 MG tablet Take 20 mg by mouth daily.    Marland Kitchen gabapentin (NEURONTIN) 100 MG capsule     . hydrOXYzine (VISTARIL) 25  MG capsule Take 1 capsule (25 mg total) by mouth every 6 (six) hours as needed. 30 capsule 0  . insulin glargine (LANTUS) 100 UNIT/ML injection Inject 30 Units into the skin at bedtime.    . insulin lispro protamine-lispro (HUMALOG 75/25 MIX) (75-25) 100 UNIT/ML SUSP injection Inject into the skin.    . Melatonin 10 MG TABS Take 10 mg by mouth daily.    . Multiple Vitamin (MULTIVITAMIN) capsule Take 1 capsule by mouth daily.    . nitroGLYCERIN (NITRODUR - DOSED IN MG/24 HR) 0.1 mg/hr patch Place 0.1 mg onto the skin daily.    . nitroGLYCERIN (NITROSTAT) 0.3 MG SL tablet Place 0.3 mg under the tongue every 5 (five) minutes as needed for chest pain.    Marland Kitchen omeprazole (PRILOSEC) 40 MG capsule Take 40 mg by mouth daily.    . permethrin  (ELIMITE) 5 % cream Apply from the chin down, leave on for 8-14 hours, rinse 1. 60 g 1  . pravastatin (PRAVACHOL) 40 MG tablet Take 40 mg by mouth at bedtime.    Marland Kitchen RELION PEN NEEDLE 31G/8MM 31G X 8 MM MISC     . spironolactone (ALDACTONE) 25 MG tablet every morning before breakfast.    . traMADol (ULTRAM) 50 MG tablet Take 1 tablet (50 mg total) by mouth every 12 (twelve) hours as needed for moderate pain. 12 tablet 0   No current facility-administered medications for this visit.       REVIEW OF SYSTEMS (Negative unless checked)  Constitutional: Weight loss  Fever  Chills Cardiac: Chest pain   Chest pressure   Palpitations   Shortness of breath when laying flat   Shortness of breath at rest   Shortness of breath with exertion. Vascular:  Pain in legs with walking   Pain in legs at rest   Pain in legs when laying flat   Claudication   Pain in feet when walking  Pain in feet at rest  Pain in feet when laying flat   History of DVT   Phlebitis   Swelling in legs   Varicose veins   Non-healing ulcers Pulmonary:   Uses home oxygen   Productive cough   Hemoptysis   Wheeze  COPD   Asthma Neurologic:  Dizziness  Blackouts   Seizures   History of stroke   History of TIA  Aphasia   Temporary blindness   Dysphagia   Weakness or numbness in arms   Weakness or numbness in legs Musculoskeletal:  Arthritis   Joint swelling   Joint pain   Low back pain Hematologic:  Easy bruising  Easy bleeding   Hypercoagulable state   Anemic  Hepatitis Gastrointestinal:  Blood in stool   Vomiting blood  Gastroesophageal reflux/heartburn   Abdominal pain Genitourinary:  Chronic kidney disease   Difficult urination  Frequent urination  Burning with urination   Hematuria Skin:  Rashes   Ulcers   Wounds Psychological:  History of anxiety    History of major depression.    Physical Exam BP 95/61 (BP  Location: Right Arm)   Pulse (!) 46   Resp 14   Ht  (1.651 m)   Wt 62.1 kg (137 lb)   BMI 22.80 kg/m  Gen:  WD/WN, NAD Head: [place]/AT, No temporalis wasting.  Ear/Nose/Throat: Hearing grossly intact, nares w/o erythema or drainage, oropharynx w/o Erythema/Exudate Eyes: Conjunctiva clear, sclera non-icteric  Neck: trachea midline.  No JVD.  Pulmonary:  Good air movement, respirations not labored, no  use of accessory muscles Cardiac: irregular Vascular:  Vessel Right Left  Radial Palpable Palpable                      Popliteal 1+ Palpable Not Palpable  PT 1+ Palpable 1+ Palpable  DP Palpable 1+ Palpable   Gastrointestinal: soft, non-tender/non-distended.  Musculoskeletal: M/S 5/5 throughout.  Extremities without ischemic changes.  No deformity or atrophy. No LE edema. Neurologic: Sensation grossly intact in extremities.  Symmetrical.  Speech is fluent. Motor exam as listed above. Psychiatric: Judgment intact, Mood & affect appropriate for pt's clinical situation. Dermatologic: No rashes or ulcers noted.  No cellulitis or open wounds. Lymph : No Cervical, Axillary, or Inguinal lymphadenopathy.   Radiology No results found.  Labs No results found for this or any previous visit (from the past 2160 hour(s)).  Assessment/Plan:  Hypertension blood pressure control important in reducing the progression of atherosclerotic disease. On appropriate oral medications.   Diabetes mellitus without complication (HCC) blood glucose control important in reducing the progression of atherosclerotic disease. Also, involved in wound healing. On appropriate medications.   Tobacco use disorder We had a discussion for approximately 3-4 minutes regarding the absolute need for smoking cessation due to the deleterious nature of tobacco on the vascular system. We discussed the tobacco use would diminish patency of any intervention, and likely significantly worsen progressio of disease. We  discussed multiple agents for quitting including replacement therapy or medications to reduce cravings such as Chantix. The patient voices their understanding of the importance of smoking cessation.  Atherosclerosis of native arteries of extremity with intermittent claudication (HCC) The patient has multiple atherosclerotic risk factors and ongoing tobacco use. We discussed the importance of tobacco cessation both as a cause of the disease as well as patency with any interventions. He had noninvasive studies performed by his primary care physician which showed a normal right ABI but a reduced left ABI is 0.8 range. I discussed the pathophysiology and natural history of peripheral arterial disease. He does not have a limb threatening symptoms. He says he cannot continue to have pain like this and would like to proceed with intervention. He is already on appropriate medical therapy of aspirin and a statin agent. He apparently cannot tolerate more potent blood thinners. Risks and benefits of angiogram were discussed and he is agreeable to proceed.      Festus BarrenJason Zonnie Landen 04/21/2017, 12:50 PM   This note was created with Dragon medical transcription system.  Any errors from dictation are unintentional.

## 2017-04-21 NOTE — Assessment & Plan Note (Signed)
blood pressure control important in reducing the progression of atherosclerotic disease. On appropriate oral medications.  

## 2017-04-21 NOTE — Assessment & Plan Note (Signed)
We had a discussion for approximately 3-4 minutes regarding the absolute need for smoking cessation due to the deleterious nature of tobacco on the vascular system. We discussed the tobacco use would diminish patency of any intervention, and likely significantly worsen progressio of disease. We discussed multiple agents for quitting including replacement therapy or medications to reduce cravings such as Chantix. The patient voices their understanding of the importance of smoking cessation.  

## 2017-04-21 NOTE — Assessment & Plan Note (Signed)
blood glucose control important in reducing the progression of atherosclerotic disease. Also, involved in wound healing. On appropriate medications.  

## 2017-04-21 NOTE — Patient Instructions (Signed)

## 2017-04-21 NOTE — Assessment & Plan Note (Signed)
The patient has multiple atherosclerotic risk factors and ongoing tobacco use. We discussed the importance of tobacco cessation both as a cause of the disease as well as patency with any interventions. He had noninvasive studies performed by his primary care physician which showed a normal right ABI but a reduced left ABI is 0.8 range. I discussed the pathophysiology and natural history of peripheral arterial disease. He does not have a limb threatening symptoms. He says he cannot continue to have pain like this and would like to proceed with intervention. He is already on appropriate medical therapy of aspirin and a statin agent. He apparently cannot tolerate more potent blood thinners. Risks and benefits of angiogram were discussed and he is agreeable to proceed.

## 2017-04-25 ENCOUNTER — Encounter
Admission: RE | Admit: 2017-04-25 | Discharge: 2017-04-25 | Disposition: A | Discharge status: Home / Self Care | Payer: Medicare PPO | Source: Ambulatory Visit | Attending: Vascular Surgery | Admitting: Vascular Surgery

## 2017-04-25 DIAGNOSIS — F17210 Nicotine dependence, cigarettes, uncomplicated: Secondary | ICD-10-CM | POA: Diagnosis not present

## 2017-04-25 DIAGNOSIS — Z9842 Cataract extraction status, left eye: Secondary | ICD-10-CM | POA: Diagnosis not present

## 2017-04-25 DIAGNOSIS — E1122 Type 2 diabetes mellitus with diabetic chronic kidney disease: Secondary | ICD-10-CM | POA: Diagnosis not present

## 2017-04-25 DIAGNOSIS — Z7982 Long term (current) use of aspirin: Secondary | ICD-10-CM | POA: Diagnosis not present

## 2017-04-25 DIAGNOSIS — I70212 Atherosclerosis of native arteries of extremities with intermittent claudication, left leg: Secondary | ICD-10-CM | POA: Diagnosis present

## 2017-04-25 DIAGNOSIS — Z794 Long term (current) use of insulin: Secondary | ICD-10-CM | POA: Diagnosis not present

## 2017-04-25 DIAGNOSIS — E785 Hyperlipidemia, unspecified: Secondary | ICD-10-CM | POA: Diagnosis not present

## 2017-04-25 DIAGNOSIS — Z95810 Presence of automatic (implantable) cardiac defibrillator: Secondary | ICD-10-CM | POA: Diagnosis not present

## 2017-04-25 DIAGNOSIS — J449 Chronic obstructive pulmonary disease, unspecified: Secondary | ICD-10-CM | POA: Diagnosis not present

## 2017-04-25 DIAGNOSIS — K219 Gastro-esophageal reflux disease without esophagitis: Secondary | ICD-10-CM | POA: Diagnosis not present

## 2017-04-25 DIAGNOSIS — I429 Cardiomyopathy, unspecified: Secondary | ICD-10-CM | POA: Diagnosis not present

## 2017-04-25 DIAGNOSIS — Z888 Allergy status to other drugs, medicaments and biological substances status: Secondary | ICD-10-CM | POA: Diagnosis not present

## 2017-04-25 DIAGNOSIS — Z9049 Acquired absence of other specified parts of digestive tract: Secondary | ICD-10-CM | POA: Diagnosis not present

## 2017-04-25 DIAGNOSIS — I130 Hypertensive heart and chronic kidney disease with heart failure and stage 1 through stage 4 chronic kidney disease, or unspecified chronic kidney disease: Secondary | ICD-10-CM | POA: Diagnosis not present

## 2017-04-25 DIAGNOSIS — N189 Chronic kidney disease, unspecified: Secondary | ICD-10-CM | POA: Diagnosis not present

## 2017-04-25 DIAGNOSIS — Z9889 Other specified postprocedural states: Secondary | ICD-10-CM | POA: Diagnosis not present

## 2017-04-25 DIAGNOSIS — I509 Heart failure, unspecified: Secondary | ICD-10-CM | POA: Diagnosis not present

## 2017-04-25 DIAGNOSIS — Z951 Presence of aortocoronary bypass graft: Secondary | ICD-10-CM | POA: Diagnosis not present

## 2017-04-25 DIAGNOSIS — I252 Old myocardial infarction: Secondary | ICD-10-CM | POA: Diagnosis not present

## 2017-04-25 LAB — CREATININE, SERUM
CREATININE: 1.52 mg/dL — AB (ref 0.61–1.24)
GFR calc Af Amer: 51 mL/min — ABNORMAL LOW (ref >60)
GFR, EST NON AFRICAN AMERICAN: 44 mL/min — AB (ref >60)

## 2017-04-25 LAB — BUN: BUN: 24 mg/dL — ABNORMAL HIGH (ref 6–20)

## 2017-04-25 MED ORDER — CEFAZOLIN SODIUM-DEXTROSE 1-4 GM/50ML-% IV SOLN
1.0000 g | Freq: Once | INTRAVENOUS | Status: AC
  Administered 2017-04-26: 1 g via INTRAVENOUS

## 2017-04-26 ENCOUNTER — Ambulatory Visit
Admission: RE | Admit: 2017-04-26 | Discharge: 2017-04-26 | Disposition: A | Discharge status: Home / Self Care | Payer: Medicare PPO | Source: Ambulatory Visit | Attending: Vascular Surgery | Admitting: Vascular Surgery

## 2017-04-26 ENCOUNTER — Encounter
Admission: RE | Disposition: A | Discharge status: Home / Self Care | Payer: Self-pay | Source: Ambulatory Visit | Attending: Vascular Surgery

## 2017-04-26 DIAGNOSIS — J449 Chronic obstructive pulmonary disease, unspecified: Secondary | ICD-10-CM | POA: Insufficient documentation

## 2017-04-26 DIAGNOSIS — E785 Hyperlipidemia, unspecified: Secondary | ICD-10-CM | POA: Insufficient documentation

## 2017-04-26 DIAGNOSIS — F17210 Nicotine dependence, cigarettes, uncomplicated: Secondary | ICD-10-CM | POA: Insufficient documentation

## 2017-04-26 DIAGNOSIS — Z951 Presence of aortocoronary bypass graft: Secondary | ICD-10-CM | POA: Insufficient documentation

## 2017-04-26 DIAGNOSIS — I70212 Atherosclerosis of native arteries of extremities with intermittent claudication, left leg: Secondary | ICD-10-CM | POA: Insufficient documentation

## 2017-04-26 DIAGNOSIS — Z7982 Long term (current) use of aspirin: Secondary | ICD-10-CM | POA: Insufficient documentation

## 2017-04-26 DIAGNOSIS — K219 Gastro-esophageal reflux disease without esophagitis: Secondary | ICD-10-CM | POA: Insufficient documentation

## 2017-04-26 DIAGNOSIS — E1122 Type 2 diabetes mellitus with diabetic chronic kidney disease: Secondary | ICD-10-CM | POA: Insufficient documentation

## 2017-04-26 DIAGNOSIS — Z794 Long term (current) use of insulin: Secondary | ICD-10-CM | POA: Insufficient documentation

## 2017-04-26 DIAGNOSIS — I509 Heart failure, unspecified: Secondary | ICD-10-CM | POA: Insufficient documentation

## 2017-04-26 DIAGNOSIS — Z9049 Acquired absence of other specified parts of digestive tract: Secondary | ICD-10-CM | POA: Insufficient documentation

## 2017-04-26 DIAGNOSIS — Z888 Allergy status to other drugs, medicaments and biological substances status: Secondary | ICD-10-CM | POA: Insufficient documentation

## 2017-04-26 DIAGNOSIS — N189 Chronic kidney disease, unspecified: Secondary | ICD-10-CM | POA: Insufficient documentation

## 2017-04-26 DIAGNOSIS — Z9842 Cataract extraction status, left eye: Secondary | ICD-10-CM | POA: Insufficient documentation

## 2017-04-26 DIAGNOSIS — I429 Cardiomyopathy, unspecified: Secondary | ICD-10-CM | POA: Insufficient documentation

## 2017-04-26 DIAGNOSIS — I252 Old myocardial infarction: Secondary | ICD-10-CM | POA: Insufficient documentation

## 2017-04-26 DIAGNOSIS — Z9889 Other specified postprocedural states: Secondary | ICD-10-CM | POA: Insufficient documentation

## 2017-04-26 DIAGNOSIS — I130 Hypertensive heart and chronic kidney disease with heart failure and stage 1 through stage 4 chronic kidney disease, or unspecified chronic kidney disease: Secondary | ICD-10-CM | POA: Insufficient documentation

## 2017-04-26 DIAGNOSIS — Z95810 Presence of automatic (implantable) cardiac defibrillator: Secondary | ICD-10-CM | POA: Insufficient documentation

## 2017-04-26 HISTORY — PX: LOWER EXTREMITY ANGIOGRAPHY: CATH118251

## 2017-04-26 LAB — GLUCOSE, CAPILLARY: Glucose-Capillary: 237 mg/dL — ABNORMAL HIGH (ref 65–99)

## 2017-04-26 SURGERY — LOWER EXTREMITY ANGIOGRAPHY
Anesthesia: Moderate Sedation | Laterality: Left

## 2017-04-26 MED ORDER — SODIUM CHLORIDE 0.9% FLUSH: 3.0000 mL | INTRAVENOUS | Status: DC | PRN

## 2017-04-26 MED ORDER — HEPARIN SODIUM (PORCINE) 1000 UNIT/ML IJ SOLN
INTRAMUSCULAR | Status: AC
  Filled 2017-04-26: qty 1

## 2017-04-26 MED ORDER — SODIUM CHLORIDE 0.9% FLUSH: 3.0000 mL | Freq: Two times a day (BID) | INTRAVENOUS | Status: DC

## 2017-04-26 MED ORDER — SODIUM CHLORIDE 0.9 % IV SOLN: INTRAVENOUS | Status: DC

## 2017-04-26 MED ORDER — LIDOCAINE-EPINEPHRINE (PF) 2 %-1:200000 IJ SOLN
INTRAMUSCULAR | Status: AC
  Filled 2017-04-26: qty 20

## 2017-04-26 MED ORDER — HEPARIN SODIUM (PORCINE) 1000 UNIT/ML IJ SOLN
INTRAMUSCULAR | Status: DC | PRN
  Administered 2017-04-26: 5000 [IU] via INTRAVENOUS

## 2017-04-26 MED ORDER — MIDAZOLAM HCL 2 MG/2ML IJ SOLN
INTRAMUSCULAR | Status: DC | PRN
  Administered 2017-04-26: 1 mg via INTRAVENOUS

## 2017-04-26 MED ORDER — CEFAZOLIN SODIUM-DEXTROSE 1-4 GM/50ML-% IV SOLN
INTRAVENOUS | Status: AC
  Filled 2017-04-26: qty 50

## 2017-04-26 MED ORDER — MIDAZOLAM HCL 2 MG/2ML IJ SOLN
INTRAMUSCULAR | Status: DC | PRN
  Administered 2017-04-26: 2 mg via INTRAVENOUS

## 2017-04-26 MED ORDER — HEPARIN (PORCINE) IN NACL 2-0.9 UNIT/ML-% IJ SOLN
INTRAMUSCULAR | Status: AC
  Filled 2017-04-26: qty 1000

## 2017-04-26 MED ORDER — CLOPIDOGREL BISULFATE 75 MG PO TABS: 75.0000 mg | ORAL_TABLET | Freq: Every day | ORAL | 11 refills | Status: DC

## 2017-04-26 MED ORDER — HYDRALAZINE HCL 20 MG/ML IJ SOLN: 5.0000 mg | INTRAMUSCULAR | Status: DC | PRN

## 2017-04-26 MED ORDER — FENTANYL CITRATE (PF) 100 MCG/2ML IJ SOLN
INTRAMUSCULAR | Status: DC | PRN
  Administered 2017-04-26: 25 ug via INTRAVENOUS
  Administered 2017-04-26: 50 ug via INTRAVENOUS

## 2017-04-26 MED ORDER — FENTANYL CITRATE (PF) 100 MCG/2ML IJ SOLN
INTRAMUSCULAR | Status: AC
  Filled 2017-04-26: qty 4

## 2017-04-26 MED ORDER — CLOPIDOGREL BISULFATE 75 MG PO TABS: 75.0000 mg | ORAL_TABLET | Freq: Every day | ORAL | Status: DC

## 2017-04-26 MED ORDER — SODIUM CHLORIDE 0.9 % IV SOLN: 250.0000 mL | INTRAVENOUS | Status: DC | PRN

## 2017-04-26 MED ORDER — SODIUM CHLORIDE 0.9 % IV SOLN
INTRAVENOUS | Status: DC
  Administered 2017-04-26: 12:00:00 via INTRAVENOUS

## 2017-04-26 MED ORDER — MIDAZOLAM HCL 5 MG/5ML IJ SOLN
INTRAMUSCULAR | Status: AC
  Filled 2017-04-26: qty 5

## 2017-04-26 MED ORDER — LABETALOL HCL 5 MG/ML IV SOLN: 10.0000 mg | INTRAVENOUS | Status: DC | PRN

## 2017-04-26 SURGICAL SUPPLY — 18 items
BALLN LUTONIX 5X220X130 (BALLOONS) ×3
BALLN ULTRVRSE 3X150X150 (BALLOONS) ×3
BALLOON LUTONIX 5X220X130 (BALLOONS) ×1 IMPLANT
BALLOON ULTRVRSE 3X150X150 (BALLOONS) ×1 IMPLANT
CATH BEACON 5 .035 65 RIM TIP (CATHETERS) ×3 IMPLANT
CATH BEACON 5 .038 100 VERT TP (CATHETERS) ×3 IMPLANT
CATH PIG 70CM (CATHETERS) ×3 IMPLANT
DEVICE PRESTO INFLATION (MISCELLANEOUS) ×3 IMPLANT
DEVICE STARCLOSE SE CLOSURE (Vascular Products) ×3 IMPLANT
GLIDEWIRE ADV .035X180CM (WIRE) ×3 IMPLANT
PACK ANGIOGRAPHY (CUSTOM PROCEDURE TRAY) ×3 IMPLANT
SHEATH ANL2 6FRX45 HC (SHEATH) ×3 IMPLANT
SHEATH BRITE TIP 5FRX11 (SHEATH) ×3 IMPLANT
STENT VIABAHN 6X150X120 (Permanent Stent) ×3 IMPLANT
SYR MEDRAD MARK V 150ML (SYRINGE) ×3 IMPLANT
TUBING CONTRAST HIGH PRESS 72 (TUBING) ×3 IMPLANT
WIRE G V18X300CM (WIRE) ×3 IMPLANT
WIRE J 3MM .035X145CM (WIRE) ×3 IMPLANT

## 2017-04-26 NOTE — Op Note (Signed)
[facility] Percutaneous Study/Intervention Procedural Note   Date of Surgery: 04/26/2017  Surgeon(s):Jentry Warnell   Assistants: none  Pre-operative Diagnosis: PAD with claudication left lower extremity  Post-operative diagnosis: Same  Procedure(s) Performed: 1. Ultrasound guidance for vascular access right femoral artery 2. Catheter placement into left posterior tibial artery from left femoral approach 3. Aortogram and selective left lower extremity angiogram 4. Percutaneous transluminal angioplasty of left posterior tibial artery with 3 mm diameter by 15 cm length angioplasty balloon 5. Percutaneous transluminal angioplasty of the left distal SFA and popliteal artery with 5 mm diameter by 22 cm length Lutonix drug-coated angioplasty  6.  Viabahn stent placement to the distal left SFA and above-knee popliteal artery with 6 mm diameter by 15 cm length stent for greater than 50% residual stenosis after angioplasty 7. StarClose closure device right femoral artery  EBL: 10 cc  Contrast: 50 cc  Fluoro Time: 6.1 minutes  Moderate Conscious Sedation Time: approximately 45 minutes using 2 mg of Versed and 75 mcg of Fentanyl  Indications: Patient is a 71 y.o.male with pain in the left leg consistent with lifestyle limiting claudication. The patient has noninvasive study showing reduced left ABI. The patient is brought in for angiography for further evaluation and potential treatment. Risks and benefits are discussed and informed consent is obtained  Procedure: The patient was identified and appropriate procedural time out was performed. The patient was then placed supine on the table and prepped and draped in the usual sterile fashion.Moderate conscious sedation was administered during a face to face encounter with the patient throughout the procedure with my supervision of  the RN administering medicines and monitoring the patient's vital signs, pulse oximetry, telemetry and mental status throughout from the start of the procedure until the patient was taken to the recovery room. Ultrasound was used to evaluate the right common femoral artery. It was patent . A digital ultrasound image was acquired. A Seldinger needle was used to access the right common femoral artery under direct ultrasound guidance and a permanent image was performed. A 0.035 J wire was advanced without resistance and a 5Fr sheath was placed. Pigtail catheter was placed into the aorta and an AP aortogram was performed. This demonstrated normal renal arteries and what appeared to be a small to medium sized infrarenal abdominal aortic aneurysm and either ectasia or mild aneurysmal dilatation of the common iliac arteries without significant stenosis. The hypogastric arteries were both occluded. The external iliac arteries appear patent. I then crossed the aortic bifurcation and advanced to the left femoral head. Selective left lower extremity angiogram was then performed. This demonstrated the proximal SFA had about a 40% stenosis. The vessel then normalized but in the distal SFA there was an occlusion. This reconstituted in the above-knee popliteal artery and there was then two-vessel runoff distally. The peroneal artery was small without significant stenosis. 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 The posterior tibial artery was the best runoff to the foot, but in the proximal 6-8 cm of the vessel was diffusely narrowed in the 75-80% range. The patient was systemically heparinized and a 6 [place] [employer] sheath was then placed over the [employer] wire. I then used a Kumpe catheter and the advantage wire to navigate through the SFA and popliteal occlusion and confirm intraluminal flow below the knee popliteal artery. I then exchanged for a 0.018 wire and easily crossed the stenosis in the  posterior tibial artery with the help of the Kumpe catheter and confirm  intraluminal flow below the stenosis. The wire was then replaced. I then proceeded with treatment. A 3 mm diameter by 15 cm length angioplasty balloon was used to treat the proximal popliteal artery up into the tibioperoneal trunk. This was inflated to 10 atm for 1 minute. Completion angiogram showed only about a 20% residual stenosis in the posterior tibial artery with this still being the dominant runoff to the foot. I then turned my attention to the distal SFA and popliteal artery. A 5 mm diameter by 22 cm length Lutonix drug-coated angioplasty balloon was used to treat this lesion. This was inflated to 8 atm for 1 minute. Completion angiogram following this showed significant residual stenosis of greater than 70% particularly at the entry point of the occlusion and over the next 8-10 cm. I elected to treat this area with a stent. A 6 mm diameter by 15 cm length Viabahn stent was then deployed in the distal SFA and above-knee popliteal artery. This was postdilated with a 5 mm balloon with excellent angiographic completion result and less than 10% residual stenosis. I elected to terminate the procedure. The sheath was removed and StarClose closure device was deployed in the right femoral artery with excellent hemostatic result. The patient was taken to the recovery room in stable condition having tolerated the procedure well.  Findings:  Aortogram: This demonstrated normal renal arteries and what appeared to be a small to medium sized infrarenal abdominal aortic aneurysm and either ectasia or mild aneurysmal dilatation of the common iliac arteries without significant stenosis. The hypogastric arteries were both occluded. The external iliac arteries appear patent. Left Lower Extremity: The proximal SFA had about a 40% stenosis. The vessel then normalized but in the distal SFA there was an occlusion. This  reconstituted in the above-knee popliteal artery and there was then two-vessel runoff distally. The peroneal artery was small without significant stenosis. 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 The posterior tibial artery was the best runoff to the foot, but in the proximal 6-8 cm of the vessel was diffusely narrowed in the 75-80% range.   Disposition: Patient was taken to the recovery room in stable condition having tolerated the procedure well.  Complications: None  Leotis Pain 04/26/2017 2:15 PM   This note was created with Dragon Medical transcription system. Any errors in dictation are purely unintentional.

## 2017-04-26 NOTE — OR Nursing (Signed)
Pt and wife stated he cant take blood thinners. They said he already takes 81 mg asa. Dr Wyn Quakerew notified of their reluctance to take Plavix due to history of bleed when on blood thinner in past. Dr Wyn Quakerew spoke to wife and husband and dicussed need for Plavix and how it is different than coumadin. He informed them that in order for leg stent to stay open he needed to take Plavix daily

## 2017-04-26 NOTE — H&P (Signed)
 [facility] History & Physical Update  The patient was interviewed and re-examined.  The patient's previous History and Physical has been reviewed and is unchanged.  There is no change in the plan of care. We plan to proceed with the scheduled procedure.  Festus BarrenJason Kialee Kham, MD  04/26/2017, 11:22 AM

## 2017-04-26 NOTE — Discharge Instructions (Signed)
Moderate Conscious Sedation, Adult °Sedation is the use of medicines to promote relaxation and relieve discomfort and anxiety. Moderate conscious sedation is a type of sedation. Under moderate conscious sedation, you are less alert than normal, but you are still able to respond to instructions, touch, or both. °Moderate conscious sedation is used during short medical and dental procedures. It is milder than deep sedation, which is a type of sedation under which you cannot be easily woken up. It is also milder than general anesthesia, which is the use of medicines to make you unconscious. Moderate conscious sedation allows you to return to your regular activities sooner. °Tell a health care provider about: °· Any allergies you have. °· All medicines you are taking, including vitamins, herbs, eye drops, creams, and over-the-counter medicines. °· Use of steroids (by mouth or creams). °· Any problems you or family members have had with sedatives and anesthetic medicines. °· Any blood disorders you have. °· Any surgeries you have had. °· Any medical conditions you have, such as sleep apnea. °· Whether you are pregnant or may be pregnant. °· Any use of cigarettes, alcohol, marijuana, or street drugs. °What are the risks? °Generally, this is a safe procedure. However, problems may occur, including: °· Getting too much medicine (oversedation). °· Nausea. °· Allergic reaction to medicines. °· Trouble breathing. If this happens, a breathing tube may be used to help with breathing. It will be removed when you are awake and breathing on your own. °· Heart trouble. °· Lung trouble. ° °What happens before the procedure? °Staying hydrated °Follow instructions from your health care provider about hydration, which may include: °· Up to 2 hours before the procedure - you may continue to drink clear liquids, such as water, clear fruit juice, black coffee, and plain tea. ° °Eating and drinking restrictions °Follow instructions from  your health care provider about eating and drinking, which may include: °· 8 hours before the procedure - stop eating heavy meals or foods such as meat, fried foods, or fatty foods. °· 6 hours before the procedure - stop eating light meals or foods, such as toast or cereal. °· 6 hours before the procedure - stop drinking milk or drinks that contain milk. °· 2 hours before the procedure - stop drinking clear liquids. ° °Medicine ° °Ask your health care provider about: °· Changing or stopping your regular medicines. This is especially important if you are taking diabetes medicines or blood thinners. °· Taking medicines such as aspirin and ibuprofen. These medicines can thin your blood. Do not take these medicines before your procedure if your health care provider instructs you not to. ° °Tests and exams °· You will have a physical exam. °· You may have blood tests done to show: °? How well your kidneys and liver are working. °? How well your blood can clot. °General instructions °· Plan to have someone take you home from the hospital or clinic. °· If you will be going home right after the procedure, plan to have someone with you for 24 hours. °What happens during the procedure? °· An IV tube will be inserted into one of your veins. °· Medicine to help you relax (sedative) will be given through the IV tube. °· The medical or dental procedure will be performed. °What happens after the procedure? °· Your blood pressure, heart rate, breathing rate, and blood oxygen level will be monitored often until the medicines you were given have worn off. °· Do not drive for 24 hours. °  This information is not intended to replace advice given to you by your health care provider. Make sure you discuss any questions you have with your health care provider. °Document Released: 04/26/2001 Document Revised: 01/05/2016 Document Reviewed: 11/21/2015 °Elsevier Interactive Patient Education © 2018 [employer] °Angiogram, Care After °This  sheet gives you information about how to care for yourself after your procedure. Your health care provider may also give you more specific instructions. If you have problems or questions, contact your health care provider. °What can I expect after the procedure? °After the procedure, it is common to have bruising and tenderness at the catheter insertion area. °Follow these instructions at home: °Insertion site care °· Follow instructions from your health care provider about how to take care of your insertion site. Make sure you: °? Wash your hands with soap and water before you change your bandage (dressing). If soap and water are not available, use hand sanitizer. °? Change your dressing as told by your health care provider. °? Leave stitches (sutures), skin glue, or adhesive strips in place. These skin closures may need to stay in place for 2 weeks or longer. If adhesive strip edges start to loosen and curl up, you may trim the loose edges. Do not remove adhesive strips completely unless your health care provider tells you to do that. °· Do not take baths, swim, or use a hot tub until your health care provider approves. °· You may shower 24-48 hours after the procedure or as told by your health care provider. °? Gently wash the site with plain soap and water. °? Pat the area dry with a clean towel. °? Do not rub the site. This may cause bleeding. °· Do not apply powder or lotion to the site. Keep the site clean and dry. °· Check your insertion site every day for signs of infection. Check for: °? Redness, swelling, or pain. °? Fluid or blood. °? Warmth. °? Pus or a bad smell. °Activity °· Rest as told by your health care provider, usually for 1-2 days. °· Do not lift anything that is heavier than 10 lbs. (4.5 kg) or as told by your health care provider. °· Do not drive for 24 hours if you were given a medicine to help you relax (sedative). °· Do not drive or use heavy machinery while taking prescription pain  medicine. °General instructions °· Return to your normal activities as told by your health care provider, usually in about a week. Ask your health care provider what activities are safe for you. °· If the catheter site starts bleeding, lie flat and put pressure on the site. If the bleeding does not stop, get help right away. This is a medical emergency. °· Drink enough fluid to keep your urine clear or pale yellow. This helps flush the contrast dye from your body. °· Take over-the-counter and prescription medicines only as told by your health care provider. °· Keep all follow-up visits as told by your health care provider. This is important. °Contact a health care provider if: °· You have a fever or chills. °· You have redness, swelling, or pain around your insertion site. °· You have fluid or blood coming from your insertion site. °· The insertion site feels warm to the touch. °· You have pus or a bad smell coming from your insertion site. °· You have bruising around the insertion site. °· You notice blood collecting in the tissue around the catheter site (hematoma). The hematoma may be painful to   the touch. °Get help right away if: °· You have severe pain at the catheter insertion area. °· The catheter insertion area swells very fast. °· The catheter insertion area is bleeding, and the bleeding does not stop when you hold steady pressure on the area. °· The area near or just beyond the catheter insertion site becomes pale, cool, tingly, or numb. °These symptoms may represent a serious problem that is an emergency. Do not wait to see if the symptoms will go away. Get medical help right away. Call your local emergency services (911 in the [place] Do not drive yourself to the hospital. °Summary °· After the procedure, it is common to have bruising and tenderness at the catheter insertion area. °· After the procedure, it is important to rest and drink plenty of fluids. °· Do not take baths, swim, or use a hot tub until  your health care provider says it is okay to do so. You may shower 24-48 hours after the procedure or as told by your health care provider. °· If the catheter site starts bleeding, lie flat and put pressure on the site. If the bleeding does not stop, get help right away. This is a medical emergency. °This information is not intended to replace advice given to you by your health care provider. Make sure you discuss any questions you have with your health care provider. °Document Released: 02/17/2005 Document Revised: 07/06/2016 Document Reviewed: 07/06/2016 °Elsevier Interactive Patient Education © 2017 [employer] ° °

## 2017-04-27 ENCOUNTER — Encounter: Payer: Self-pay | Admitting: Vascular Surgery

## 2017-05-15 ENCOUNTER — Other Ambulatory Visit (INDEPENDENT_AMBULATORY_CARE_PROVIDER_SITE_OTHER): Payer: Self-pay | Admitting: Vascular Surgery

## 2017-05-15 DIAGNOSIS — I739 Peripheral vascular disease, unspecified: Secondary | ICD-10-CM

## 2017-05-17 ENCOUNTER — Encounter (INDEPENDENT_AMBULATORY_CARE_PROVIDER_SITE_OTHER): Payer: Self-pay | Admitting: Vascular Surgery

## 2017-05-17 ENCOUNTER — Ambulatory Visit (INDEPENDENT_AMBULATORY_CARE_PROVIDER_SITE_OTHER): Payer: Medicare PPO

## 2017-05-17 ENCOUNTER — Ambulatory Visit (INDEPENDENT_AMBULATORY_CARE_PROVIDER_SITE_OTHER): Payer: Medicare PPO | Admitting: Vascular Surgery

## 2017-05-17 VITALS — BP 80/54 | HR 68 | Resp 14 | Ht 65.0 in | Wt 133.0 lb

## 2017-05-17 DIAGNOSIS — E785 Hyperlipidemia, unspecified: Secondary | ICD-10-CM | POA: Insufficient documentation

## 2017-05-17 DIAGNOSIS — I739 Peripheral vascular disease, unspecified: Secondary | ICD-10-CM

## 2017-05-17 DIAGNOSIS — I70213 Atherosclerosis of native arteries of extremities with intermittent claudication, bilateral legs: Secondary | ICD-10-CM | POA: Diagnosis not present

## 2017-05-17 DIAGNOSIS — E119 Type 2 diabetes mellitus without complications: Secondary | ICD-10-CM

## 2017-05-17 DIAGNOSIS — F17200 Nicotine dependence, unspecified, uncomplicated: Secondary | ICD-10-CM | POA: Diagnosis not present

## 2017-05-17 NOTE — Progress Notes (Signed)
Subjective:    Patient ID: Tia Alert, male    DOB: 16-Nov-1945, 71 y.o.   MRN: [id number] Chief Complaint  Patient presents with  . Follow-up    4 weeks [facility] follow up   Patient presents to review vascular studies. This is his first post procedure follow-up. He is status post a left lower extremity angiogram for peripheral artery disease with claudication. Patient states improvement to the left lower extremity states that his calf cramping has resolved. He is experiencing significant right lower extremity claudication at this time. Explains that he experiences right calf claudication with activity. His interval claudication distance has decreased. Right calf pain will wake him up at night. His symptoms have progressed to the point that he is unable to function on a daily basis. He denies any ulceration to the lower extremity. The patient underwent an aortoiliac duplex exam which was notable for: AAA: 3.0cm x 3.9cm LCIA: 2.1cm x 1.9cm RCIA: 1.6cm x 1.3cm There is biphasic blood flow through the left common iliac and external iliac arteries. There is biphasic blood flow through the left common iliac artery and triphasic blood flow through the right external iliac artery. An ABI was notable for biphasic tibials on the right and biphasic tibials on the left. Unable to obtain reliable bilateral ankle brachial indices due to lack of correlation between ankle pressures and doppler waveforms due to medial calcification.The bilateral toe brachial indices are abnormal. Patient denies any fever, nausea or vomiting.   Review of Systems  Constitutional: Negative.   HENT: Negative.   Eyes: Negative.   Respiratory: Negative.   Cardiovascular:       Right lower extremity pain  Gastrointestinal: Negative.   Endocrine: Negative.   Genitourinary: Negative.   Musculoskeletal: Negative.   Skin: Negative.   Allergic/Immunologic: Negative.   Neurological: Negative.   Hematological: Negative.     Psychiatric/Behavioral: Negative.       Objective:   Physical Exam  Constitutional: He is oriented to person, place, and time. He appears well-developed and well-nourished. No distress.  HENT:  Head: Normocephalic and atraumatic.  Eyes: Pupils are equal, round, and reactive to light. Conjunctivae are normal.  Neck: Normal range of motion.  Cardiovascular: Normal rate, regular rhythm, normal heart sounds and intact distal pulses.   Pulses:      Radial pulses are 2+ on the right side, and 2+ on the left side.       Dorsalis pedis pulses are 0 on the right side, and 1+ on the left side.       Posterior tibial pulses are 0 on the right side, and 0 on the left side.  Pulmonary/Chest: Effort normal.  Abdominal: Soft. He exhibits no distension. There is no tenderness. There is no rebound.  Musculoskeletal: Normal range of motion. He exhibits no edema.  Neurological: He is alert and oriented to person, place, and time.  Skin: Skin is warm and dry. He is not diaphoretic.  Psychiatric: He has a normal mood and affect. His behavior is normal. Judgment and thought content normal.  Vitals reviewed.  BP (!) 80/54 (BP Location: Right Arm)   Pulse 68   Resp 14   Ht  (1.651 m)   Wt 133 lb (60.3 kg)   BMI 22.13 kg/m   Past Medical History:  Diagnosis Date  . AICD (automatic cardioverter/defibrillator) present 2010   needs to be changed out but patient unable to afford it at this time  . Anginal pain (HCC)   .  Bilateral swelling of feet   . CHF (congestive heart failure) (HCC)   . Chronic cough   . Chronic kidney disease    renal insufficiency.stated on lasix  . COPD (chronic obstructive pulmonary disease) (HCC)    does not use o2  . Coronary artery disease   . Diabetes mellitus without complication (HCC)   . Dysrhythmia   . GERD (gastroesophageal reflux disease)   . Hepatitis   . Hypercholesteremia   . Hypertension    history of,   . Myocardial infarction (HCC) 1993   several   . Pneumonia   . Presence of permanent cardiac pacemaker    and defibrilator   Social History   Social History  . Marital status: Married    Spouse name: N/A  . Number of children: N/A  . Years of education: N/A   Occupational History  . Not on file.   Social History Main Topics  . Smoking status: Current Every Day Smoker    Packs/day: 1.00    Years: 55.00  . Smokeless tobacco: Never Used  . Alcohol use No  . Drug use: Unknown  . Sexual activity: Not on file   Other Topics Concern  . Not on file   Social History Narrative  . No narrative on file   Past Surgical History:  Procedure Laterality Date  . CARDIAC CATHETERIZATION     with stents  . CATARACT EXTRACTION W/PHACO Left 02/23/2016   Procedure: CATARACT EXTRACTION PHACO AND INTRAOCULAR LENS PLACEMENT (IOC);  Surgeon: Galen Manila, MD;  Location: [facility] ORS;  Service: Ophthalmology;  Laterality: Left;  [place] 1.05 AP% 22.9 CDE 15.02 Fluid pack lot # [id number]  . CHOLECYSTECTOMY    . CORONARY ARTERY BYPASS GRAFT    . feeding tube removal  2014  . GASTROSTOMY W/ FEEDING TUBE  2014   after galbladder surgery.  . LOWER EXTREMITY ANGIOGRAPHY Left 04/26/2017   Procedure: Lower Extremity Angiography;  Surgeon: Annice Needy, MD;  Location: [facility] INVASIVE CV LAB;  Service: Cardiovascular;  Laterality: Left;  . PANCREATIC CYST EXCISION     had hepatitis at this time   No family history on file.  Allergies  Allergen Reactions  . Adhesive [Tape] Other (See Comments)    "PULLS SKIN OFF" PER PT REPORT. OK TO USE PAPER TAPE.  . Other     Intolerant to blood thinners - pt states that he has had "internal bleeding" while on blood thinners      Assessment & Plan:  Patient presents to review vascular studies. This is his first post procedure follow-up. He is status post a left lower extremity angiogram for peripheral artery disease with claudication. Patient states improvement to the left lower extremity states that his calf  cramping has resolved. He is experiencing significant right lower extremity claudication at this time. Explains that he experiences right calf claudication with activity. His interval claudication distance has decreased. Right calf pain will wake him up at night. His symptoms have progressed to the point that he is unable to function on a daily basis. He denies any ulceration to the lower extremity. The patient underwent an aortoiliac duplex exam which was notable for: AAA: 3.0cm x 3.9cm LCIA: 2.1cm x 1.9cm RCIA: 1.6cm x 1.3cm There is biphasic blood flow through the left common iliac and external iliac arteries. There is biphasic blood flow through the left common iliac artery and triphasic blood flow through the right external iliac artery. An ABI was notable for biphasic tibials on the  right and biphasic tibials on the left. Unable to obtain reliable bilateral ankle brachial indices due to lack of correlation between ankle pressures and doppler waveforms due to medial calcification.The bilateral toe brachial indices are abnormal. Patient denies any fever, nausea or vomiting.  1. Atherosclerosis of native artery of both lower extremities with intermittent claudication (HCC) - Stable Patient with progressively worsening claudication and possibly rest pain to the right lower extremity Patient with a strong medical history of peripheral artery disease to the left lower extremity requiring angioplasty and stent placement Unable to palpate pedal pulses to the right lower extremity Recommend a right lower extremity angiogram with possible intervention in an attempt to assess anatomy and degree of peripheral artery disease Procedure, risks and benefits explained to the patient All questions answered Patient wishes to proceed  - VAS [place] AAA DUPLEX; Future - VAS [place] LOWER EXTREMITY ARTERIAL DUPLEX; Future  2. AAA - Stable The patient has an asymptomatic abdominal aortic aneurysm that is less than 4 cm in  maximal diameter.  I have reviewed the natural history of abdominal aortic aneurysm and the small risk of rupture for aneurysm less than 5 cm in size.  However, as these small aneurysms tend to enlarge over time, continued surveillance with ultrasound or CT scan is mandatory.  Patient to follow up in 6 months with AAA if this is stable we can possibly remove follow-up without yearly The patient's blood pressure is being adequately controlled however I have reviewed the importance of hypertension and lipid control and the importance of continuing his abstinence from tobacco.  The patient is also encouraged to exercise a minimum of 30 minutes 4 times a week.  Should the patient develop new onset abdominal or back pain or signs of peripheral embolization they are instructed to seek medical attention immediately and to alert the physician providing care that they have an aneurysm.  The patient voices their understanding.  3. Lower Extremity Aneurysmal Disease - Stable Patient with aneurysms to the right and left common iliac arteries These are less than 3.5cm  in size and seemed to be asymptomatic at the time I will continue to follow this. If these should increase to 3.5 cm or become symptomatic they may need to be treated I will follow this with an arterial duplex Patient presents as his understanding  2. Diabetes mellitus without complication (HCC) - Stable Encouraged good control as its slows the progression of atherosclerotic disease  3. Tobacco use disorder - Stable We had a discussion for approximately 10 minutes regarding the absolute need for smoking cessation due to the deleterious nature of tobacco on the vascular system. We discussed the tobacco use would diminish patency of any intervention, and likely significantly worsen progressio of disease. We discussed multiple agents for quitting including replacement therapy or medications to reduce cravings such as Chantix. The patient voices their  understanding of the importance of smoking cessation.  4. Hyperlipidemia, unspecified hyperlipidemia type - Stable Encouraged good control as its slows the progression of atherosclerotic disease  Current Outpatient Prescriptions on File Prior to Visit  Medication Sig Dispense Refill  . ACCU-CHEK SMARTVIEW test strip     . aspirin EC 81 MG tablet Take 81 mg by mouth daily.    . carvedilol (COREG) 25 MG tablet Take 25 mg by mouth 2 (two) times daily with a meal.    . clopidogrel (PLAVIX) 75 MG tablet Take 1 tablet (75 mg total) by mouth daily. 30 tablet 11  . cyanocobalamin (,  VITAMIN B-12,) 1000 MCG/ML injection Inject into the muscle.    . digoxin (LANOXIN) 0.125 MG tablet Take 0.125 mg by mouth daily.    . furosemide (LASIX) 20 MG tablet Take 20 mg by mouth daily.    Marland Kitchen gabapentin (NEURONTIN) 100 MG capsule Take 100 mg by mouth every morning.     . hydrOXYzine (VISTARIL) 25 MG capsule Take 1 capsule (25 mg total) by mouth every 6 (six) hours as needed. 30 capsule 0  . insulin glargine (LANTUS) 100 UNIT/ML injection Inject 30 Units into the skin at bedtime.    . insulin lispro protamine-lispro (HUMALOG 75/25 MIX) (75-25) 100 UNIT/ML SUSP injection Inject into the skin.    . Melatonin 10 MG TABS Take 10 mg by mouth daily.    . Multiple Vitamin (MULTIVITAMIN) capsule Take 1 capsule by mouth daily.    . nitroGLYCERIN (NITRODUR - DOSED IN MG/24 HR) 0.1 mg/hr patch Place 0.1 mg onto the skin daily.    . nitroGLYCERIN (NITROSTAT) 0.3 MG SL tablet Place 0.3 mg under the tongue every 5 (five) minutes as needed for chest pain.    Marland Kitchen omeprazole (PRILOSEC) 40 MG capsule Take 40 mg by mouth daily.    . permethrin (ELIMITE) 5 % cream Apply from the chin down, leave on for 8-14 hours, rinse 1. 60 g 1  . pravastatin (PRAVACHOL) 40 MG tablet Take 40 mg by mouth at bedtime.    Marland Kitchen RELION PEN NEEDLE 31G/8MM 31G X 8 MM MISC     . spironolactone (ALDACTONE) 25 MG tablet every morning before breakfast.    .  traMADol (ULTRAM) 50 MG tablet Take 1 tablet (50 mg total) by mouth every 12 (twelve) hours as needed for moderate pain. (Patient not taking: Reported on 04/26/2017) 12 tablet 0   No current facility-administered medications on file prior to visit.     There are no Patient Instructions on file for this visit. No Follow-up on file.   Ceira Hoeschen A Latifa Noble, PA-C

## 2017-05-18 ENCOUNTER — Other Ambulatory Visit (INDEPENDENT_AMBULATORY_CARE_PROVIDER_SITE_OTHER): Payer: Self-pay | Admitting: Vascular Surgery

## 2017-05-18 ENCOUNTER — Encounter (INDEPENDENT_AMBULATORY_CARE_PROVIDER_SITE_OTHER): Payer: Self-pay

## 2017-05-18 ENCOUNTER — Telehealth (INDEPENDENT_AMBULATORY_CARE_PROVIDER_SITE_OTHER): Payer: Self-pay

## 2017-05-18 NOTE — Telephone Encounter (Signed)
I have attempted to contact the patient twice to schedule his angio and had to leave messages each time.

## 2017-05-23 ENCOUNTER — Encounter
Admission: RE | Admit: 2017-05-23 | Discharge: 2017-05-23 | Disposition: A | Discharge status: Home / Self Care | Payer: Medicare PPO | Source: Ambulatory Visit | Attending: Vascular Surgery | Admitting: Vascular Surgery

## 2017-05-23 DIAGNOSIS — E1122 Type 2 diabetes mellitus with diabetic chronic kidney disease: Secondary | ICD-10-CM | POA: Diagnosis not present

## 2017-05-23 DIAGNOSIS — Z7902 Long term (current) use of antithrombotics/antiplatelets: Secondary | ICD-10-CM | POA: Diagnosis not present

## 2017-05-23 DIAGNOSIS — N189 Chronic kidney disease, unspecified: Secondary | ICD-10-CM | POA: Diagnosis not present

## 2017-05-23 DIAGNOSIS — I130 Hypertensive heart and chronic kidney disease with heart failure and stage 1 through stage 4 chronic kidney disease, or unspecified chronic kidney disease: Secondary | ICD-10-CM | POA: Diagnosis not present

## 2017-05-23 DIAGNOSIS — Z79899 Other long term (current) drug therapy: Secondary | ICD-10-CM | POA: Diagnosis not present

## 2017-05-23 DIAGNOSIS — F17210 Nicotine dependence, cigarettes, uncomplicated: Secondary | ICD-10-CM | POA: Diagnosis not present

## 2017-05-23 DIAGNOSIS — Z95810 Presence of automatic (implantable) cardiac defibrillator: Secondary | ICD-10-CM | POA: Diagnosis not present

## 2017-05-23 DIAGNOSIS — I714 Abdominal aortic aneurysm, without rupture: Secondary | ICD-10-CM | POA: Diagnosis not present

## 2017-05-23 DIAGNOSIS — I739 Peripheral vascular disease, unspecified: Secondary | ICD-10-CM | POA: Diagnosis present

## 2017-05-23 DIAGNOSIS — I509 Heart failure, unspecified: Secondary | ICD-10-CM | POA: Diagnosis not present

## 2017-05-23 DIAGNOSIS — J449 Chronic obstructive pulmonary disease, unspecified: Secondary | ICD-10-CM | POA: Diagnosis not present

## 2017-05-23 DIAGNOSIS — E785 Hyperlipidemia, unspecified: Secondary | ICD-10-CM | POA: Diagnosis not present

## 2017-05-23 DIAGNOSIS — I252 Old myocardial infarction: Secondary | ICD-10-CM | POA: Diagnosis not present

## 2017-05-23 DIAGNOSIS — Z7982 Long term (current) use of aspirin: Secondary | ICD-10-CM | POA: Diagnosis not present

## 2017-05-23 DIAGNOSIS — I70213 Atherosclerosis of native arteries of extremities with intermittent claudication, bilateral legs: Secondary | ICD-10-CM | POA: Diagnosis not present

## 2017-05-23 LAB — CREATININE, SERUM
CREATININE: 1.3 mg/dL — AB (ref 0.61–1.24)
GFR, EST NON AFRICAN AMERICAN: 53 mL/min — AB (ref >60)

## 2017-05-23 LAB — BUN: BUN: 18 mg/dL (ref 6–20)

## 2017-05-23 NOTE — OR Nursing (Signed)
Patient aware of preop instructions for upcoming vascular procedure.  Informed of what meds to stop and which ones to take in the morning of procedure. Instructed patient and wife to contact MD office with questions.

## 2017-05-23 NOTE — OR Nursing (Signed)
Patient states that he has not started taking Plavix as he is nervous about internal bleeding. He has had previous incident of gastro intestinal bleeding and will not take the Plavix.  Also, he has not been taking Neurontin as he does not feel it has been helpful. Continues to receive B12 injection monthly. As is his usual, he has a congested deep cough.

## 2017-05-24 MED ORDER — CEFAZOLIN SODIUM-DEXTROSE 2-4 GM/100ML-% IV SOLN: 2.0000 g | Freq: Once | INTRAVENOUS | Status: DC

## 2017-05-25 ENCOUNTER — Ambulatory Visit
Admission: RE | Admit: 2017-05-25 | Discharge: 2017-05-25 | Disposition: A | Discharge status: Home / Self Care | Payer: Medicare PPO | Source: Ambulatory Visit | Attending: Vascular Surgery | Admitting: Vascular Surgery

## 2017-05-25 ENCOUNTER — Encounter: Payer: Self-pay | Admitting: *Deleted

## 2017-05-25 ENCOUNTER — Encounter
Admission: RE | Disposition: A | Discharge status: Home / Self Care | Payer: Self-pay | Source: Ambulatory Visit | Attending: Vascular Surgery

## 2017-05-25 DIAGNOSIS — I70211 Atherosclerosis of native arteries of extremities with intermittent claudication, right leg: Secondary | ICD-10-CM | POA: Diagnosis not present

## 2017-05-25 DIAGNOSIS — I509 Heart failure, unspecified: Secondary | ICD-10-CM | POA: Insufficient documentation

## 2017-05-25 DIAGNOSIS — Z7902 Long term (current) use of antithrombotics/antiplatelets: Secondary | ICD-10-CM | POA: Insufficient documentation

## 2017-05-25 DIAGNOSIS — I252 Old myocardial infarction: Secondary | ICD-10-CM | POA: Insufficient documentation

## 2017-05-25 DIAGNOSIS — E785 Hyperlipidemia, unspecified: Secondary | ICD-10-CM | POA: Insufficient documentation

## 2017-05-25 DIAGNOSIS — E1122 Type 2 diabetes mellitus with diabetic chronic kidney disease: Secondary | ICD-10-CM | POA: Insufficient documentation

## 2017-05-25 DIAGNOSIS — F17210 Nicotine dependence, cigarettes, uncomplicated: Secondary | ICD-10-CM | POA: Insufficient documentation

## 2017-05-25 DIAGNOSIS — Z7982 Long term (current) use of aspirin: Secondary | ICD-10-CM | POA: Insufficient documentation

## 2017-05-25 DIAGNOSIS — I130 Hypertensive heart and chronic kidney disease with heart failure and stage 1 through stage 4 chronic kidney disease, or unspecified chronic kidney disease: Secondary | ICD-10-CM | POA: Insufficient documentation

## 2017-05-25 DIAGNOSIS — I70213 Atherosclerosis of native arteries of extremities with intermittent claudication, bilateral legs: Secondary | ICD-10-CM | POA: Diagnosis not present

## 2017-05-25 DIAGNOSIS — J449 Chronic obstructive pulmonary disease, unspecified: Secondary | ICD-10-CM | POA: Insufficient documentation

## 2017-05-25 DIAGNOSIS — N189 Chronic kidney disease, unspecified: Secondary | ICD-10-CM | POA: Insufficient documentation

## 2017-05-25 DIAGNOSIS — Z79899 Other long term (current) drug therapy: Secondary | ICD-10-CM | POA: Insufficient documentation

## 2017-05-25 DIAGNOSIS — I714 Abdominal aortic aneurysm, without rupture: Secondary | ICD-10-CM | POA: Insufficient documentation

## 2017-05-25 DIAGNOSIS — Z95810 Presence of automatic (implantable) cardiac defibrillator: Secondary | ICD-10-CM | POA: Insufficient documentation

## 2017-05-25 HISTORY — PX: LOWER EXTREMITY ANGIOGRAPHY: CATH118251

## 2017-05-25 LAB — GLUCOSE, CAPILLARY
GLUCOSE-CAPILLARY: 61 mg/dL — AB (ref 65–99)
Glucose-Capillary: 216 mg/dL — ABNORMAL HIGH (ref 65–99)

## 2017-05-25 SURGERY — LOWER EXTREMITY ANGIOGRAPHY
Anesthesia: Moderate Sedation | Laterality: Right

## 2017-05-25 MED ORDER — SODIUM CHLORIDE 0.9% FLUSH: 3.0000 mL | Freq: Two times a day (BID) | INTRAVENOUS | Status: DC

## 2017-05-25 MED ORDER — LIDOCAINE-EPINEPHRINE (PF) 1 %-1:200000 IJ SOLN
INTRAMUSCULAR | Status: AC
  Filled 2017-05-25: qty 30

## 2017-05-25 MED ORDER — SODIUM CHLORIDE 0.9 % IV SOLN
INTRAVENOUS | Status: DC
  Administered 2017-05-25: 08:00:00 via INTRAVENOUS

## 2017-05-25 MED ORDER — HEPARIN SODIUM (PORCINE) 1000 UNIT/ML IJ SOLN
INTRAMUSCULAR | Status: AC
  Filled 2017-05-25: qty 1

## 2017-05-25 MED ORDER — SODIUM CHLORIDE 0.9 % IV SOLN: 250.0000 mL | INTRAVENOUS | Status: DC | PRN

## 2017-05-25 MED ORDER — SODIUM CHLORIDE 0.9 % IV SOLN: INTRAVENOUS | Status: DC

## 2017-05-25 MED ORDER — HEPARIN (PORCINE) IN NACL 2-0.9 UNIT/ML-% IJ SOLN
INTRAMUSCULAR | Status: AC
  Filled 2017-05-25: qty 1000

## 2017-05-25 MED ORDER — DEXTROSE 50 % IV SOLN: 50.0000 mL | Freq: Once | INTRAVENOUS | Status: DC

## 2017-05-25 MED ORDER — MIDAZOLAM HCL 2 MG/2ML IJ SOLN
INTRAMUSCULAR | Status: DC | PRN
Start: 2017-05-25 — End: 2017-05-25
  Administered 2017-05-25: 1 mg via INTRAVENOUS
  Administered 2017-05-25: 2 mg via INTRAVENOUS

## 2017-05-25 MED ORDER — HYDRALAZINE HCL 20 MG/ML IJ SOLN: 5.0000 mg | INTRAMUSCULAR | Status: DC | PRN

## 2017-05-25 MED ORDER — FENTANYL CITRATE (PF) 100 MCG/2ML IJ SOLN
INTRAMUSCULAR | Status: AC
  Filled 2017-05-25: qty 2

## 2017-05-25 MED ORDER — MIDAZOLAM HCL 5 MG/5ML IJ SOLN
INTRAMUSCULAR | Status: AC
  Filled 2017-05-25: qty 5

## 2017-05-25 MED ORDER — HEPARIN SODIUM (PORCINE) 1000 UNIT/ML IJ SOLN
INTRAMUSCULAR | Status: DC | PRN
Start: 2017-05-25 — End: 2017-05-25
  Administered 2017-05-25: 4000 [IU] via INTRAVENOUS

## 2017-05-25 MED ORDER — FAMOTIDINE 20 MG PO TABS: 40.0000 mg | ORAL_TABLET | ORAL | Status: DC | PRN

## 2017-05-25 MED ORDER — IOPAMIDOL (ISOVUE-300) INJECTION 61%
INTRAVENOUS | Status: DC | PRN
  Administered 2017-05-25: 40 mL via INTRAVENOUS

## 2017-05-25 MED ORDER — SODIUM CHLORIDE 0.9% FLUSH: 3.0000 mL | INTRAVENOUS | Status: DC | PRN

## 2017-05-25 MED ORDER — LABETALOL HCL 5 MG/ML IV SOLN: 10.0000 mg | INTRAVENOUS | Status: DC | PRN

## 2017-05-25 MED ORDER — DEXTROSE 50 % IV SOLN
INTRAVENOUS | Status: AC
  Filled 2017-05-25: qty 50

## 2017-05-25 MED ORDER — FENTANYL CITRATE (PF) 100 MCG/2ML IJ SOLN
INTRAMUSCULAR | Status: DC | PRN
  Administered 2017-05-25 (×2): 50 ug via INTRAVENOUS

## 2017-05-25 MED ORDER — METHYLPREDNISOLONE SODIUM SUCC 125 MG IJ SOLR: 125.0000 mg | INTRAMUSCULAR | Status: DC | PRN

## 2017-05-25 SURGICAL SUPPLY — 15 items
BALLN LUTONIX 6X150X130 (BALLOONS) ×3
BALLN LUTONIX DCB 6X60X130 (BALLOONS) ×3
BALLOON LUTONIX 6X150X130 (BALLOONS) ×1 IMPLANT
BALLOON LUTONIX DCB 6X60X130 (BALLOONS) ×1 IMPLANT
CATH BEACON 5 .038 100 VERT TP (CATHETERS) ×3 IMPLANT
CATH PIG 70CM (CATHETERS) ×3 IMPLANT
DEVICE PRESTO INFLATION (MISCELLANEOUS) ×3 IMPLANT
DEVICE STARCLOSE SE CLOSURE (Vascular Products) ×3 IMPLANT
PACK ANGIOGRAPHY (CUSTOM PROCEDURE TRAY) ×3 IMPLANT
SHEATH ANL2 6FRX45 HC (SHEATH) ×3 IMPLANT
SHEATH BRITE TIP 5FRX11 (SHEATH) ×3 IMPLANT
SYR MEDRAD MARK V 150ML (SYRINGE) ×3 IMPLANT
TUBING CONTRAST HIGH PRESS 72 (TUBING) ×3 IMPLANT
WIRE J 3MM .035X145CM (WIRE) ×3 IMPLANT
WIRE MAGIC TORQUE 260C (WIRE) ×3 IMPLANT

## 2017-05-25 NOTE — Op Note (Signed)
[name] Percutaneous Study/Intervention Procedural Note   Date of Surgery: 05/25/2017  Surgeon(s):Shellee Streng   Assistants:none  Pre-operative Diagnosis: PAD with claudication bilateral lower extremities, status post left lower extremity revascularization  Post-operative diagnosis: Same  Procedure(s) Performed: 1. Ultrasound guidance for vascular access left femoral artery 2. Catheter placement into right popliteal artery from left femoral approach 3. Selective right lower extremity angiogram 4. Percutaneous transluminal angioplasty of proximal right SFA with 6 mm diameter by 6 cm length loop tonics drug-coated angioplasty balloon 5. Percutaneous transluminal angioplasty of the distal right SFA with 6 mm diameter by 15 cm length angioplasty balloon  6.  StarClose closure device left femoral artery  EBL: 5 cc  Contrast: 40 cc  Fluoro Time: 2.3 minutes  Moderate Conscious Sedation Time: approximately 25 minutes using 3 mg of Versed and 100 Mcg of Fentanyl  Indications: Patient is a 71 y.o.male with claudication in both lower extremities.  He has already undergone left lower extremity intervention. The patient has noninvasive study showing reduced ABIs bilaterally prior to his initial intervention. The patient is brought in for angiography for further evaluation and potential treatment. Risks and benefits are discussed and informed consent is obtained  Procedure: The patient was identified and appropriate procedural time out was performed. The patient was then placed supine on the table and prepped and draped in the usual sterile fashion.Moderate conscious sedation was administered during a face to face encounter with the patient throughout the procedure with my supervision of the RN administering medicines and monitoring the patient's vital signs, pulse oximetry, telemetry and  mental status throughout from the start of the procedure until the patient was taken to the recovery room. Ultrasound was used to evaluate the left common femoral artery. It was patent . A digital ultrasound image was acquired. A Seldinger needle was used to access the left common femoral artery under direct ultrasound guidance and a permanent image was performed. A 0.035 J wire was advanced without resistance and a 5Fr sheath was placed. Aortagram was not done as this was recently performed and no intervention was needed.  I then crossed the aortic bifurcation and advanced to the right femoral head. Selective right lower extremity angiogram was then performed. This demonstrated normal common femoral artery and profunda femoris artery.  The proximal superficial femoral artery has about a 60-65% stenosis over a short segment.  The vessel then normalizes for several centimeters and in the mid to distal superficial femoral artery there is diffuse irregularity over about a 10 cm area with greater than 60% stenosis present in a couple of locations.  The popliteal artery the normalizes.  There is then a large peroneal artery which was continuous distally with occlusion of the anterior tibial and posterior tibial arteries proximally with reconstitution in the lower leg. The patient was systemically heparinized and a 6 [place] Ansell sheath was then placed over the [employer] wire. I then used a Kumpe catheter and the Magic torque wire to navigate through the SFA stenoses and confirm intraluminal flow in the popliteal artery.  I then proceeded with treatment.  The more distal lesion in the distal SFA was treated with a 6 mm diameter by 15 cm length loop tonics drug-coated angioplasty balloon inflated to 8 atm for 1 minute.  Completion angiogram showed about a 25-30% residual stenosis at the most without flow limitation.  The proximal SFA lesion was addressed with a 6 mm diameter by 6 cm length with tonics  drug-coated angioplasty balloon  inflated to 12 atm for 1 minute.  There was about a 20% residual stenosis with a non-flow-limiting dissection present.  At this point, there is no role for stent placement. I elected to terminate the procedure. The sheath was removed and StarClose closure device was deployed in the left femoral artery with excellent hemostatic result. The patient was taken to the recovery room in stable condition having tolerated the procedure well.  Findings:   Right Lower Extremity:Normal common femoral artery and profunda femoris artery.  The proximal superficial femoral artery has about a 60-65% stenosis over a short segment.  The vessel then normalizes for several centimeters and in the mid to distal superficial femoral artery there is diffuse irregularity over about a 10 cm area with greater than 60% stenosis present in a couple of locations.  The popliteal artery the normalizes.  There is then a large peroneal artery which was continuous distally with occlusion of the anterior tibial and posterior tibial arteries proximally with reconstitution in the lower leg.   Disposition: Patient was taken to the recovery room in stable condition having tolerated the procedure well.  Complications: None  [name] 05/25/2017 9:35 AM   This note was created with Dragon Medical transcription system. Any errors in dictation are purely unintentional.

## 2017-05-25 NOTE — Progress Notes (Signed)
Pt monitor found with SVT. Pt asymptomatic, denies CP or SOB. Rhythm remained for about 30 seconds and resolved. EKG obtained showing junctional tachycardia. BP checked and at baseline 106/70. HR back to 60s 1st degree AVB with wide complex QRS. Dr. Wyn Quaker paged and updated on Patient status. No additional orders received.

## 2017-05-25 NOTE — H&P (Signed)
[facility] History & Physical Update  The patient was interviewed and re-examined.  The patient's previous History and Physical has been reviewed and is unchanged.  There is no change in the plan of care. We plan to proceed with the scheduled procedure.  Festus Barren, MD  05/25/2017, 8:47 AM

## 2017-06-07 ENCOUNTER — Emergency Department
Admission: EM | Admit: 2017-06-07 | Discharge: 2017-06-07 | Disposition: A | Discharge status: Other Acute Inpatient Hospital | Payer: Medicare PPO | Attending: Emergency Medicine | Admitting: Emergency Medicine

## 2017-06-07 ENCOUNTER — Encounter: Payer: Self-pay | Admitting: Emergency Medicine

## 2017-06-07 ENCOUNTER — Emergency Department: Payer: Medicare PPO

## 2017-06-07 DIAGNOSIS — F17200 Nicotine dependence, unspecified, uncomplicated: Secondary | ICD-10-CM | POA: Insufficient documentation

## 2017-06-07 DIAGNOSIS — J449 Chronic obstructive pulmonary disease, unspecified: Secondary | ICD-10-CM | POA: Insufficient documentation

## 2017-06-07 DIAGNOSIS — I2510 Atherosclerotic heart disease of native coronary artery without angina pectoris: Secondary | ICD-10-CM | POA: Diagnosis not present

## 2017-06-07 DIAGNOSIS — I509 Heart failure, unspecified: Secondary | ICD-10-CM | POA: Diagnosis not present

## 2017-06-07 DIAGNOSIS — T8140XA Infection following a procedure, unspecified, initial encounter: Secondary | ICD-10-CM | POA: Diagnosis present

## 2017-06-07 DIAGNOSIS — Y69 Unspecified misadventure during surgical and medical care: Secondary | ICD-10-CM | POA: Insufficient documentation

## 2017-06-07 DIAGNOSIS — Z79899 Other long term (current) drug therapy: Secondary | ICD-10-CM | POA: Diagnosis not present

## 2017-06-07 DIAGNOSIS — Z950 Presence of cardiac pacemaker: Secondary | ICD-10-CM | POA: Diagnosis not present

## 2017-06-07 DIAGNOSIS — T827XXA Infection and inflammatory reaction due to other cardiac and vascular devices, implants and grafts, initial encounter: Secondary | ICD-10-CM

## 2017-06-07 DIAGNOSIS — Z794 Long term (current) use of insulin: Secondary | ICD-10-CM | POA: Diagnosis not present

## 2017-06-07 DIAGNOSIS — E1122 Type 2 diabetes mellitus with diabetic chronic kidney disease: Secondary | ICD-10-CM | POA: Diagnosis not present

## 2017-06-07 DIAGNOSIS — N189 Chronic kidney disease, unspecified: Secondary | ICD-10-CM | POA: Insufficient documentation

## 2017-06-07 DIAGNOSIS — I130 Hypertensive heart and chronic kidney disease with heart failure and stage 1 through stage 4 chronic kidney disease, or unspecified chronic kidney disease: Secondary | ICD-10-CM | POA: Insufficient documentation

## 2017-06-07 LAB — CBC WITH DIFFERENTIAL/PLATELET
BASOS ABS: 0.1 10*3/uL (ref 0–0.1)
BASOS PCT: 1 %
Eosinophils Absolute: 0.4 10*3/uL (ref 0–0.7)
Eosinophils Relative: 4 %
HEMATOCRIT: 35.1 % — AB (ref 40.0–52.0)
HEMOGLOBIN: 11.6 g/dL — AB (ref 13.0–18.0)
LYMPHS ABS: 2 10*3/uL (ref 1.0–3.6)
Lymphocytes Relative: 18 %
MCH: 28.6 pg (ref 26.0–34.0)
MCHC: 33.2 g/dL (ref 32.0–36.0)
MCV: 86.3 fL (ref 80.0–100.0)
MONO ABS: 0.7 10*3/uL (ref 0.2–1.0)
MONOS PCT: 7 %
Neutro Abs: 7.9 10*3/uL — ABNORMAL HIGH (ref 1.4–6.5)
Neutrophils Relative %: 70 %
Platelets: 293 10*3/uL (ref 150–440)
RBC: 4.07 MIL/uL — AB (ref 4.40–5.90)
RDW: 14.9 % — ABNORMAL HIGH (ref 11.5–14.5)
WBC: 11.1 10*3/uL — ABNORMAL HIGH (ref 3.8–10.6)

## 2017-06-07 LAB — BASIC METABOLIC PANEL
ANION GAP: 10 (ref 5–15)
BUN: 17 mg/dL (ref 6–20)
CHLORIDE: 101 mmol/L (ref 101–111)
CO2: 23 mmol/L (ref 22–32)
CREATININE: 1.24 mg/dL (ref 0.61–1.24)
Calcium: 9.2 mg/dL (ref 8.9–10.3)
GFR calc non Af Amer: 56 mL/min — ABNORMAL LOW (ref >60)
Glucose, Bld: 216 mg/dL — ABNORMAL HIGH (ref 65–99)
POTASSIUM: 4.8 mmol/L (ref 3.5–5.1)
SODIUM: 134 mmol/L — AB (ref 135–145)

## 2017-06-07 LAB — LACTIC ACID, PLASMA: LACTIC ACID, VENOUS: 0.7 mmol/L (ref 0.5–1.9)

## 2017-06-07 MED ORDER — VANCOMYCIN HCL IN DEXTROSE 1-5 GM/200ML-% IV SOLN
1000.0000 mg | Freq: Once | INTRAVENOUS | Status: AC
  Administered 2017-06-07: 1000 mg via INTRAVENOUS
  Filled 2017-06-07: qty 200

## 2017-06-07 MED ORDER — PIPERACILLIN-TAZOBACTAM 3.375 G IVPB 30 MIN
3.3750 g | Freq: Once | INTRAVENOUS | Status: AC
  Administered 2017-06-07: 3.375 g via INTRAVENOUS
  Filled 2017-06-07: qty 50

## 2017-06-07 NOTE — ED Provider Notes (Signed)
[facility] Emergency Department Provider Note  ____________________________________________  Time seen: Approximately 1:32 PM  I have reviewed the triage vital signs and the nursing notes.   HISTORY  Chief Complaint Wound Infection   HPI KAVAUGHN FAUCETT is a 71 y.o. male status post AICD who presents for evaluation of cellulitis and purulent discharge from his pacemaker site.patient has noticed progressively worsening redness and swelling overlying the site of his AICD for the last 3-4 days. Yesterday evening he noticed purulent discharge which has gotten markedly increased today. No fever or chills, no nausea or vomiting, no chest pain or shortness of breath, no abdominal pain. Patient is endorsing 5 out of 10 pain located at the site of the AICD D the has been constant and nonradiating for the last 24 hours. His AICD was placed at [facility] and patient is requesting transfer to [facility] for further management at this time.  Past Medical History:  Diagnosis Date  . AICD (automatic cardioverter/defibrillator) present 2010   battery changed out 2015  . Anginal pain (HCC)    infrequent pain. usually feels as pain in the arms  . Bilateral swelling of feet   . CHF (congestive heart failure) (HCC)   . Chronic cough   . Chronic kidney disease    renal insufficiency.started on spironalactone  . COPD (chronic obstructive pulmonary disease) (HCC)    does not use o2  . Coronary artery disease   . Diabetes mellitus without complication (HCC)   . Dysrhythmia   . GERD (gastroesophageal reflux disease)   . Hepatitis   . Hypercholesteremia   . Hypertension    history of,   . Myocardial infarction (HCC) 1993   several. per patient, has had 10 heart attacks  . Pneumonia   . Presence of permanent cardiac pacemaker    and defibrilator    Patient Active Problem List   Diagnosis Date Noted  . Hyperlipidemia 05/17/2017  . Hypertension 04/21/2017  . Diabetes mellitus  without complication (HCC) 04/21/2017  . Tobacco use disorder 04/21/2017  . Atherosclerosis of native arteries of extremity with intermittent claudication (HCC) 04/21/2017    Past Surgical History:  Procedure Laterality Date  . CARDIAC CATHETERIZATION     with stents  . CARDIAC DEFIBRILLATOR PLACEMENT  2010  . CATARACT EXTRACTION W/PHACO Left 02/23/2016   Procedure: CATARACT EXTRACTION PHACO AND INTRAOCULAR LENS PLACEMENT (IOC);  Surgeon: Galen Manila, MD;  Location: [facility] ORS;  Service: Ophthalmology;  Laterality: Left;  [place] 1.05 AP% 22.9 CDE 15.02 Fluid pack lot # [id number]  . CHOLECYSTECTOMY  2014  . CORONARY ARTERY BYPASS GRAFT    . feeding tube removal  2014  . GASTROSTOMY W/ FEEDING TUBE  2014   after galbladder surgery.  . LOWER EXTREMITY ANGIOGRAPHY Left 04/26/2017   Procedure: Lower Extremity Angiography;  Surgeon: Annice Needy, MD;  Location: [facility] INVASIVE CV LAB;  Service: Cardiovascular;  Laterality: Left;  . LOWER EXTREMITY ANGIOGRAPHY Right 05/25/2017   Procedure: Lower Extremity Angiography;  Surgeon: Annice Needy, MD;  Location: [facility] INVASIVE CV LAB;  Service: Cardiovascular;  Laterality: Right;  . PANCREATIC CYST EXCISION  2014   had hepatitis at this time  . STENT PLACEMENT NON-VASCULAR ([facility] HX)     cardiac stents x many    Prior to Admission medications   Medication Sig Start Date End Date Taking? Authorizing Provider  ACCU-CHEK SMARTVIEW test strip  02/13/17   [provider]  aspirin EC 81 MG tablet Take 81 mg by  mouth daily.    [provider]  carvedilol (COREG) 25 MG tablet Take 25 mg by mouth 2 (two) times daily with a meal.    [provider]  clopidogrel (PLAVIX) 75 MG tablet Take 1 tablet (75 mg total) by mouth daily. Patient not taking: Reported on 05/25/2017 04/26/17   Annice Needy, MD  cyanocobalamin (,VITAMIN B-12,) 1000 MCG/ML injection Inject 1,000 mcg into the muscle every 30 (thirty) days.  03/24/17 03/19/18  [provider]  digoxin (LANOXIN) 0.125 MG tablet Take 0.125 mg by mouth daily.    [provider]  furosemide (LASIX) 20 MG tablet Take 20 mg by mouth daily.    [provider]  gabapentin (NEURONTIN) 100 MG capsule Take 100 mg by mouth every morning.  03/23/17   [provider]  hydrOXYzine (VISTARIL) 25 MG capsule Take 1 capsule (25 mg total) by mouth every 6 (six) hours as needed. Patient not taking: Reported on 05/23/2017 07/16/16   Evon Slack, PA-C  insulin glargine (LANTUS) 100 UNIT/ML injection Inject 30 Units into the skin at bedtime.    [provider]  insulin lispro protamine-lispro (HUMALOG 75/25 MIX) (75-25) 100 UNIT/ML SUSP injection Inject into the skin. 03/28/17   [provider]  Melatonin 10 MG TABS Take 10 mg by mouth at bedtime.     [provider]  Multiple Vitamin (MULTIVITAMIN) capsule Take 1 capsule by mouth daily.    [provider]  nitroGLYCERIN (NITRODUR - DOSED IN MG/24 HR) 0.1 mg/hr patch Place 0.1 mg onto the skin daily.     [provider]  nitroGLYCERIN (NITROSTAT) 0.3 MG SL tablet Place 0.3 mg under the tongue every 5 (five) minutes as needed for chest pain.    [provider]  omeprazole (PRILOSEC) 40 MG capsule Take 40 mg by mouth daily.    [provider]  permethrin (ELIMITE) 5 % cream Apply from the chin down, leave on for 8-14 hours, rinse 1. Patient not taking: Reported on 05/23/2017 07/16/16 07/16/17  Evon Slack, PA-C  pravastatin (PRAVACHOL) 40 MG tablet Take 40 mg by mouth at bedtime.    [provider]  RELION PEN NEEDLE 31G/8MM 31G X 8 MM MISC  03/28/17   [provider]  spironolactone (ALDACTONE) 25 MG tablet every morning before breakfast. 05/17/16   [provider]  traMADol (ULTRAM) 50 MG tablet Take 1 tablet (50 mg total) by mouth every 12 (twelve) hours as needed for moderate pain. Patient not taking: Reported on 04/26/2017  03/16/16   Joni Reining, PA-C    Allergies Other and Adhesive [tape]  No family history on file.  Social History Social History  Substance Use Topics  . Smoking status: Current Every Day Smoker    Packs/day: 1.00    Years: 55.00  . Smokeless tobacco: Never Used  . Alcohol use No     Comment: rare    Review of Systems  Constitutional: Negative for fever. Eyes: Negative for visual changes. ENT: Negative for sore throat. Neck: No neck pain  Cardiovascular: Negative for chest pain. Respiratory: Negative for shortness of breath. Gastrointestinal: Negative for abdominal pain, vomiting or diarrhea. Genitourinary: Negative for dysuria. Musculoskeletal: Negative for back pain. Skin: Negative for rash. + pacemaker infection Neurological: Negative for headaches, weakness or numbness. Psych: No SI or HI  ____________________________________________   PHYSICAL EXAM:  VITAL SIGNS: ED Triage Vitals  Enc Vitals Group     BP 06/07/17 1259 116/75  Pulse Rate 06/07/17 1259 60     Resp 06/07/17 1259 18     Temp 06/07/17 1259 98 F (36.7 C)     Temp Source 06/07/17 1259 Oral     SpO2 06/07/17 1259 99 %     Weight 06/07/17 1300 135 lb (61.2 kg)     Height 06/07/17 1300 5\' 5"  (1.651 m)     Head Circumference --      Peak Flow --      Pain Score 06/07/17 1259 5     Pain Loc --      Pain Edu? --      Excl. in GC? --     Constitutional: Alert and oriented. Well appearing and in no apparent distress. HEENT:      Head: Normocephalic and atraumatic.         Eyes: Conjunctivae are normal. Sclera is non-icteric.       Mouth/Throat: Mucous membranes are moist.       Neck: Supple with no signs of meningismus. Cardiovascular: Regular rate and rhythm. No murmurs, gallops, or rubs. 2+ symmetrical distal pulses are present in all extremities. No JVD. Respiratory: Normal respiratory effort. Lungs are clear to auscultation bilaterally. No wheezes, crackles, or rhonchi.   Gastrointestinal: Soft, non tender, and non distended with positive bowel sounds. No rebound or guarding. Musculoskeletal: Nontender with normal range of motion in all extremities. No edema, cyanosis, or erythema of extremities. Neurologic: Normal speech and language. Face is symmetric. Moving all extremities. No gross focal neurologic deficits are appreciated. Skin: there is significant erythema and warmth overlying the pacemaker at the left chest wall with moderate amount of purulent discharge. Psychiatric: Mood and affect are normal. Speech and behavior are normal.  ____________________________________________   LABS (all labs ordered are listed, but only abnormal results are displayed)  Labs Reviewed  CBC WITH DIFFERENTIAL/PLATELET - Abnormal; Notable for the following:       Result Value   WBC 11.1 (*)    RBC 4.07 (*)    Hemoglobin 11.6 (*)    HCT 35.1 (*)    RDW 14.9 (*)    Neutro Abs 7.9 (*)    All other components within normal limits  BASIC METABOLIC PANEL - Abnormal; Notable for the following:    Sodium 134 (*)    Glucose, Bld 216 (*)    GFR calc non Af Amer 56 (*)    All other components within normal limits  CULTURE, BLOOD (ROUTINE X 2)  CULTURE, BLOOD (ROUTINE X 2)  GRAM STAIN  AEROBIC/ANAEROBIC CULTURE (SURGICAL/DEEP WOUND)  LACTIC ACID, PLASMA   ____________________________________________  EKG  none ____________________________________________  RADIOLOGY  CXR: negative  ____________________________________________   PROCEDURES  Procedure(s) performed: None Procedures Critical Care performed:  None ____________________________________________   INITIAL IMPRESSION / ASSESSMENT AND PLAN / ED COURSE  71 y.o. male status post AICD who presents for evaluation of cellulitis and purulent discharge from his pacemaker site. patient is in no obvious distress, well appearing, normal vital signs with no signs of sepsis. Does have cellulitis overlying the  pacemaker with moderate amount of purulent discharge. We'll swab positive and sent for culture and Gram stain. We'll send blood cultures, lactate, and basic blood work. We'll cover patient broadly with Zosyn and vancomycin. We'll discuss with his cardiologist at [facility]. Patient will need admission for possible removal of device and also for evaluation of possible endocarditis.    _________________________ 1:54 PM on 06/07/2017 -----------------------------------------  Spoke with Dr. Sandi Carne, [facility] cardiologistwho  agrees with current management of this patient and has accepted patient for transfer. They are working on getting him a bed at this time. Will start patient on antibiotics and [facility]   _________________________ 5:38 PM on 06/07/2017 -----------------------------------------  Patient remains stable with no signs of sepsis. Has received abx and [facility]  As part of my medical decision making, I reviewed the following data within the electronic MEDICAL RECORD NUMBER Nursing notes reviewed and incorporated, Labs reviewed , Old chart reviewed, Radiograph reviewed , A consult was requested and obtained from this/these consultant(s) [facility], Notes from prior ED visits and [place] Controlled Substance Database    Pertinent labs & imaging results that were available during my care of the patient were reviewed by me and considered in my medical decision making (see chart for details).    ____________________________________________   FINAL CLINICAL IMPRESSION(S) / ED DIAGNOSES  Final diagnoses:  Pacemaker infection, initial encounter (HCC)      NEW MEDICATIONS STARTED DURING THIS VISIT:  New Prescriptions   No medications on file     Note:  This document was prepared using Dragon voice recognition software and may include unintentional dictation errors.    Nita SickleVeronese, [place], MD 06/07/17 [phone]

## 2017-06-07 NOTE — ED Notes (Signed)
Called [facility] for transport to [facility] Room 7127  [phone] 

## 2017-06-07 NOTE — ED Notes (Signed)
[facility]  Spoke to [place]   [phone]

## 2017-06-07 NOTE — ED Notes (Signed)
[facility] [phone]  [phone]

## 2017-06-07 NOTE — ED Triage Notes (Signed)
Pt arrives via [facility] from home with redness around ICD x 3-4 days. Began seeing yellow-colored pus this morning. Pt denies fevers, chills and shortness of breath. Per patient, he was here 05/25/17 for stent placement. VSS.

## 2017-06-07 NOTE — ED Notes (Signed)
[facility] transport team at bedside with patient.

## 2017-06-12 LAB — CULTURE, BLOOD (ROUTINE X 2)
Culture: NO GROWTH
Culture: NO GROWTH

## 2017-06-13 LAB — AEROBIC/ANAEROBIC CULTURE W GRAM STAIN (SURGICAL/DEEP WOUND)

## 2017-06-13 LAB — AEROBIC/ANAEROBIC CULTURE (SURGICAL/DEEP WOUND): CULTURE: NORMAL

## 2017-06-28 ENCOUNTER — Other Ambulatory Visit (INDEPENDENT_AMBULATORY_CARE_PROVIDER_SITE_OTHER): Payer: Self-pay | Admitting: Vascular Surgery

## 2017-06-28 DIAGNOSIS — I739 Peripheral vascular disease, unspecified: Secondary | ICD-10-CM

## 2017-06-30 ENCOUNTER — Encounter (INDEPENDENT_AMBULATORY_CARE_PROVIDER_SITE_OTHER): Payer: Medicare PPO

## 2017-06-30 ENCOUNTER — Ambulatory Visit (INDEPENDENT_AMBULATORY_CARE_PROVIDER_SITE_OTHER): Payer: Medicare PPO | Admitting: Vascular Surgery

## 2017-11-17 ENCOUNTER — Other Ambulatory Visit (INDEPENDENT_AMBULATORY_CARE_PROVIDER_SITE_OTHER): Payer: Medicare PPO

## 2017-11-17 ENCOUNTER — Ambulatory Visit (INDEPENDENT_AMBULATORY_CARE_PROVIDER_SITE_OTHER): Payer: Medicare PPO | Admitting: Vascular Surgery

## 2017-11-17 ENCOUNTER — Encounter (INDEPENDENT_AMBULATORY_CARE_PROVIDER_SITE_OTHER): Payer: Medicare PPO

## 2018-02-13 ENCOUNTER — Inpatient Hospital Stay
Admission: EM | Admit: 2018-02-13 | Discharge: 2018-02-14 | DRG: 308 | Disposition: A | Discharge status: Home / Self Care | Payer: Medicare HMO | Attending: Internal Medicine | Admitting: Internal Medicine

## 2018-02-13 ENCOUNTER — Emergency Department: Payer: Medicare HMO

## 2018-02-13 ENCOUNTER — Other Ambulatory Visit: Payer: Self-pay

## 2018-02-13 DIAGNOSIS — Z95810 Presence of automatic (implantable) cardiac defibrillator: Secondary | ICD-10-CM

## 2018-02-13 DIAGNOSIS — R002 Palpitations: Secondary | ICD-10-CM

## 2018-02-13 DIAGNOSIS — Z91048 Other nonmedicinal substance allergy status: Secondary | ICD-10-CM

## 2018-02-13 DIAGNOSIS — Z955 Presence of coronary angioplasty implant and graft: Secondary | ICD-10-CM | POA: Diagnosis not present

## 2018-02-13 DIAGNOSIS — E43 Unspecified severe protein-calorie malnutrition: Secondary | ICD-10-CM | POA: Diagnosis present

## 2018-02-13 DIAGNOSIS — Z7982 Long term (current) use of aspirin: Secondary | ICD-10-CM

## 2018-02-13 DIAGNOSIS — I2510 Atherosclerotic heart disease of native coronary artery without angina pectoris: Secondary | ICD-10-CM | POA: Diagnosis present

## 2018-02-13 DIAGNOSIS — Z79899 Other long term (current) drug therapy: Secondary | ICD-10-CM

## 2018-02-13 DIAGNOSIS — I5020 Unspecified systolic (congestive) heart failure: Secondary | ICD-10-CM | POA: Diagnosis present

## 2018-02-13 DIAGNOSIS — J449 Chronic obstructive pulmonary disease, unspecified: Secondary | ICD-10-CM | POA: Diagnosis present

## 2018-02-13 DIAGNOSIS — Z888 Allergy status to other drugs, medicaments and biological substances status: Secondary | ICD-10-CM | POA: Diagnosis not present

## 2018-02-13 DIAGNOSIS — Z951 Presence of aortocoronary bypass graft: Secondary | ICD-10-CM

## 2018-02-13 DIAGNOSIS — E7800 Pure hypercholesterolemia, unspecified: Secondary | ICD-10-CM | POA: Diagnosis present

## 2018-02-13 DIAGNOSIS — N189 Chronic kidney disease, unspecified: Secondary | ICD-10-CM | POA: Diagnosis present

## 2018-02-13 DIAGNOSIS — Z7902 Long term (current) use of antithrombotics/antiplatelets: Secondary | ICD-10-CM | POA: Diagnosis not present

## 2018-02-13 DIAGNOSIS — N179 Acute kidney failure, unspecified: Secondary | ICD-10-CM | POA: Diagnosis present

## 2018-02-13 DIAGNOSIS — K219 Gastro-esophageal reflux disease without esophagitis: Secondary | ICD-10-CM | POA: Diagnosis present

## 2018-02-13 DIAGNOSIS — I252 Old myocardial infarction: Secondary | ICD-10-CM

## 2018-02-13 DIAGNOSIS — Z9119 Patient's noncompliance with other medical treatment and regimen: Secondary | ICD-10-CM

## 2018-02-13 DIAGNOSIS — I472 Ventricular tachycardia, unspecified: Secondary | ICD-10-CM

## 2018-02-13 DIAGNOSIS — F17200 Nicotine dependence, unspecified, uncomplicated: Secondary | ICD-10-CM | POA: Diagnosis present

## 2018-02-13 DIAGNOSIS — E119 Type 2 diabetes mellitus without complications: Secondary | ICD-10-CM

## 2018-02-13 DIAGNOSIS — Z794 Long term (current) use of insulin: Secondary | ICD-10-CM | POA: Diagnosis not present

## 2018-02-13 DIAGNOSIS — Z8619 Personal history of other infectious and parasitic diseases: Secondary | ICD-10-CM

## 2018-02-13 DIAGNOSIS — I130 Hypertensive heart and chronic kidney disease with heart failure and stage 1 through stage 4 chronic kidney disease, or unspecified chronic kidney disease: Secondary | ICD-10-CM | POA: Diagnosis present

## 2018-02-13 DIAGNOSIS — Z6820 Body mass index (BMI) 20.0-20.9, adult: Secondary | ICD-10-CM | POA: Diagnosis not present

## 2018-02-13 DIAGNOSIS — E1122 Type 2 diabetes mellitus with diabetic chronic kidney disease: Secondary | ICD-10-CM | POA: Diagnosis present

## 2018-02-13 LAB — COMPREHENSIVE METABOLIC PANEL
ALT: 16 U/L (ref 0–44)
AST: 35 U/L (ref 15–41)
Albumin: 3.7 g/dL (ref 3.5–5.0)
Alkaline Phosphatase: 117 U/L (ref 38–126)
Anion gap: 11 (ref 5–15)
BUN: 26 mg/dL — AB (ref 8–23)
CHLORIDE: 104 mmol/L (ref 98–111)
CO2: 21 mmol/L — AB (ref 22–32)
CREATININE: 1.39 mg/dL — AB (ref 0.61–1.24)
Calcium: 8.7 mg/dL — ABNORMAL LOW (ref 8.9–10.3)
GFR, EST AFRICAN AMERICAN: 56 mL/min — AB (ref >60)
GFR, EST NON AFRICAN AMERICAN: 49 mL/min — AB (ref >60)
Glucose, Bld: 365 mg/dL — ABNORMAL HIGH (ref 70–99)
Potassium: 3.6 mmol/L (ref 3.5–5.1)
Sodium: 136 mmol/L (ref 135–145)
Total Bilirubin: 0.9 mg/dL (ref 0.3–1.2)
Total Protein: 7.2 g/dL (ref 6.5–8.1)

## 2018-02-13 LAB — URINALYSIS, COMPLETE (UACMP) WITH MICROSCOPIC
Bilirubin Urine: NEGATIVE
Hgb urine dipstick: NEGATIVE
Ketones, ur: NEGATIVE mg/dL
Leukocytes, UA: NEGATIVE
Nitrite: NEGATIVE
PH: 5 (ref 5.0–8.0)
PROTEIN: NEGATIVE mg/dL
SPECIFIC GRAVITY, URINE: 1.016 (ref 1.005–1.030)
SQUAMOUS EPITHELIAL / LPF: NONE SEEN (ref 0–5)

## 2018-02-13 LAB — CBC
HCT: 39.7 % — ABNORMAL LOW (ref 40.0–52.0)
Hemoglobin: 13 g/dL (ref 13.0–18.0)
MCH: 27.9 pg (ref 26.0–34.0)
MCHC: 32.8 g/dL (ref 32.0–36.0)
MCV: 85 fL (ref 80.0–100.0)
PLATELETS: 218 10*3/uL (ref 150–440)
RBC: 4.67 MIL/uL (ref 4.40–5.90)
RDW: 15.5 % — ABNORMAL HIGH (ref 11.5–14.5)
WBC: 10.9 10*3/uL — ABNORMAL HIGH (ref 3.8–10.6)

## 2018-02-13 LAB — HEMOGLOBIN A1C
Hgb A1c MFr Bld: 12.4 % — ABNORMAL HIGH (ref 4.8–5.6)
Mean Plasma Glucose: 309.18 mg/dL

## 2018-02-13 LAB — GLUCOSE, CAPILLARY
Glucose-Capillary: 358 mg/dL — ABNORMAL HIGH (ref 70–99)
Glucose-Capillary: 335 mg/dL — ABNORMAL HIGH (ref 70–99)
Glucose-Capillary: 319 mg/dL — ABNORMAL HIGH (ref 70–99)
Glucose-Capillary: 225 mg/dL — ABNORMAL HIGH (ref 70–99)
Glucose-Capillary: 275 mg/dL — ABNORMAL HIGH (ref 70–99)

## 2018-02-13 LAB — TSH: TSH: 2.612 u[IU]/mL (ref 0.350–4.500)

## 2018-02-13 LAB — PROTIME-INR
INR: 1.12
PROTHROMBIN TIME: 14.3 s (ref 11.4–15.2)

## 2018-02-13 LAB — MAGNESIUM: MAGNESIUM: 1.6 mg/dL — AB (ref 1.7–2.4)

## 2018-02-13 LAB — TROPONIN I: Troponin I: 0.04 ng/mL (ref <0.03)

## 2018-02-13 MED ORDER — CARVEDILOL 25 MG PO TABS
25.0000 mg | ORAL_TABLET | Freq: Two times a day (BID) | ORAL | Status: DC
  Administered 2018-02-13 – 2018-02-14 (×3): 25 mg via ORAL
  Filled 2018-02-13 (×3): qty 1

## 2018-02-13 MED ORDER — PRAVASTATIN SODIUM 40 MG PO TABS
40.0000 mg | ORAL_TABLET | Freq: Every day | ORAL | Status: DC
  Administered 2018-02-13: 40 mg via ORAL
  Filled 2018-02-13: qty 1

## 2018-02-13 MED ORDER — PREMIER PROTEIN SHAKE
11.0000 [oz_av] | Freq: Two times a day (BID) | ORAL | Status: DC
  Administered 2018-02-14: 11 [oz_av] via ORAL

## 2018-02-13 MED ORDER — ONDANSETRON HCL 4 MG/2ML IJ SOLN
4.0000 mg | Freq: Once | INTRAMUSCULAR | Status: AC
  Administered 2018-02-13: 4 mg via INTRAVENOUS
  Filled 2018-02-13: qty 2

## 2018-02-13 MED ORDER — ADULT MULTIVITAMIN W/MINERALS CH
1.0000 | ORAL_TABLET | Freq: Every day | ORAL | Status: DC
  Administered 2018-02-13 – 2018-02-14 (×2): 1 via ORAL
  Filled 2018-02-13 (×2): qty 1

## 2018-02-13 MED ORDER — HYDROCORTISONE 0.5 % EX CREA
TOPICAL_CREAM | Freq: Three times a day (TID) | CUTANEOUS | Status: DC
  Administered 2018-02-13 – 2018-02-14 (×4): via TOPICAL
  Filled 2018-02-13: qty 28.35

## 2018-02-13 MED ORDER — PREDNISONE 20 MG PO TABS
20.0000 mg | ORAL_TABLET | Freq: Two times a day (BID) | ORAL | Status: DC
  Administered 2018-02-13 – 2018-02-14 (×4): 20 mg via ORAL
  Filled 2018-02-13 (×4): qty 1

## 2018-02-13 MED ORDER — CLOPIDOGREL BISULFATE 75 MG PO TABS
75.0000 mg | ORAL_TABLET | Freq: Every day | ORAL | Status: DC
  Administered 2018-02-13 – 2018-02-14 (×2): 75 mg via ORAL
  Filled 2018-02-13 (×2): qty 1

## 2018-02-13 MED ORDER — GABAPENTIN 100 MG PO CAPS
100.0000 mg | ORAL_CAPSULE | ORAL | Status: DC
  Administered 2018-02-13 – 2018-02-14 (×2): 100 mg via ORAL
  Filled 2018-02-13 (×2): qty 1

## 2018-02-13 MED ORDER — NITROGLYCERIN 0.1 MG/HR TD PT24
0.1000 mg | MEDICATED_PATCH | Freq: Every day | TRANSDERMAL | Status: DC
  Administered 2018-02-13 – 2018-02-14 (×2): 0.1 mg via TRANSDERMAL
  Filled 2018-02-13 (×2): qty 1

## 2018-02-13 MED ORDER — ACETAMINOPHEN 325 MG PO TABS: 650.0000 mg | ORAL_TABLET | Freq: Four times a day (QID) | ORAL | Status: DC | PRN

## 2018-02-13 MED ORDER — INSULIN ASPART 100 UNIT/ML ~~LOC~~ SOLN
0.0000 [IU] | Freq: Three times a day (TID) | SUBCUTANEOUS | Status: DC
  Administered 2018-02-13: 3 [IU] via SUBCUTANEOUS
  Administered 2018-02-13 (×2): 7 [IU] via SUBCUTANEOUS
  Administered 2018-02-14: 9 [IU] via SUBCUTANEOUS
  Filled 2018-02-13 (×4): qty 1

## 2018-02-13 MED ORDER — DIGOXIN 125 MCG PO TABS
0.1250 mg | ORAL_TABLET | Freq: Every day | ORAL | Status: DC
  Administered 2018-02-13 – 2018-02-14 (×2): 0.125 mg via ORAL
  Filled 2018-02-13 (×2): qty 1

## 2018-02-13 MED ORDER — HYDROXYZINE PAMOATE 25 MG PO CAPS
25.0000 mg | ORAL_CAPSULE | Freq: Four times a day (QID) | ORAL | Status: DC | PRN
  Filled 2018-02-13: qty 1

## 2018-02-13 MED ORDER — AMIODARONE HCL IN DEXTROSE 360-4.14 MG/200ML-% IV SOLN
60.0000 mg/h | INTRAVENOUS | Status: AC
  Administered 2018-02-13: 60 mg/h via INTRAVENOUS
  Filled 2018-02-13: qty 200

## 2018-02-13 MED ORDER — HYDROCORTISONE NA SUCCINATE PF 100 MG IJ SOLR
100.0000 mg | Freq: Three times a day (TID) | INTRAMUSCULAR | Status: DC
  Filled 2018-02-13 (×2): qty 2

## 2018-02-13 MED ORDER — MELATONIN 5 MG PO TABS
10.0000 mg | ORAL_TABLET | Freq: Every day | ORAL | Status: DC
  Administered 2018-02-13: 10 mg via ORAL
  Filled 2018-02-13 (×2): qty 2

## 2018-02-13 MED ORDER — SODIUM CHLORIDE 0.9% FLUSH
3.0000 mL | Freq: Two times a day (BID) | INTRAVENOUS | Status: DC
  Administered 2018-02-13 – 2018-02-14 (×2): 3 mL via INTRAVENOUS

## 2018-02-13 MED ORDER — SPIRONOLACTONE 25 MG PO TABS
25.0000 mg | ORAL_TABLET | Freq: Every day | ORAL | Status: DC
  Administered 2018-02-13 – 2018-02-14 (×2): 25 mg via ORAL
  Filled 2018-02-13 (×2): qty 1

## 2018-02-13 MED ORDER — INSULIN GLARGINE 100 UNIT/ML ~~LOC~~ SOLN
18.0000 [IU] | Freq: Every day | SUBCUTANEOUS | Status: DC
  Administered 2018-02-13 (×2): 18 [IU] via SUBCUTANEOUS
  Filled 2018-02-13 (×3): qty 0.18

## 2018-02-13 MED ORDER — AMIODARONE HCL 200 MG PO TABS
400.0000 mg | ORAL_TABLET | Freq: Every day | ORAL | Status: DC
Start: 2018-02-13 — End: 2018-02-14
  Administered 2018-02-13 – 2018-02-14 (×2): 400 mg via ORAL
  Filled 2018-02-13 (×2): qty 2

## 2018-02-13 MED ORDER — NITROGLYCERIN 0.4 MG SL SUBL: 0.4000 mg | SUBLINGUAL_TABLET | SUBLINGUAL | Status: DC | PRN

## 2018-02-13 MED ORDER — ONDANSETRON HCL 4 MG/2ML IJ SOLN: 4.0000 mg | Freq: Four times a day (QID) | INTRAMUSCULAR | Status: DC | PRN

## 2018-02-13 MED ORDER — AMIODARONE HCL IN DEXTROSE 360-4.14 MG/200ML-% IV SOLN
30.0000 mg/h | INTRAVENOUS | Status: DC
  Administered 2018-02-13: 30 mg/h via INTRAVENOUS
  Filled 2018-02-13: qty 200

## 2018-02-13 MED ORDER — ENOXAPARIN SODIUM 40 MG/0.4ML ~~LOC~~ SOLN: 40.0000 mg | SUBCUTANEOUS | Status: DC

## 2018-02-13 MED ORDER — PANTOPRAZOLE SODIUM 40 MG PO TBEC
40.0000 mg | DELAYED_RELEASE_TABLET | Freq: Every day | ORAL | Status: DC
  Administered 2018-02-13 – 2018-02-14 (×2): 40 mg via ORAL
  Filled 2018-02-13 (×2): qty 1

## 2018-02-13 MED ORDER — MAGNESIUM SULFATE 2 GM/50ML IV SOLN
2.0000 g | Freq: Once | INTRAVENOUS | Status: AC
  Administered 2018-02-13: 2 g via INTRAVENOUS
  Filled 2018-02-13: qty 50

## 2018-02-13 MED ORDER — DOCUSATE SODIUM 100 MG PO CAPS
100.0000 mg | ORAL_CAPSULE | Freq: Two times a day (BID) | ORAL | Status: DC
  Administered 2018-02-13 – 2018-02-14 (×2): 100 mg via ORAL
  Filled 2018-02-13 (×2): qty 1

## 2018-02-13 MED ORDER — HYDROXYZINE HCL 25 MG PO TABS: 25.0000 mg | ORAL_TABLET | Freq: Four times a day (QID) | ORAL | Status: DC | PRN

## 2018-02-13 MED ORDER — ONDANSETRON HCL 4 MG PO TABS: 4.0000 mg | ORAL_TABLET | Freq: Four times a day (QID) | ORAL | Status: DC | PRN

## 2018-02-13 MED ORDER — TRAMADOL HCL 50 MG PO TABS
50.0000 mg | ORAL_TABLET | Freq: Two times a day (BID) | ORAL | Status: DC | PRN
  Administered 2018-02-13: 50 mg via ORAL
  Filled 2018-02-13: qty 1

## 2018-02-13 MED ORDER — FUROSEMIDE 20 MG PO TABS
20.0000 mg | ORAL_TABLET | Freq: Every day | ORAL | Status: DC
  Administered 2018-02-13 – 2018-02-14 (×2): 20 mg via ORAL
  Filled 2018-02-13 (×2): qty 1

## 2018-02-13 MED ORDER — ASPIRIN EC 81 MG PO TBEC
81.0000 mg | DELAYED_RELEASE_TABLET | Freq: Every day | ORAL | Status: DC
  Administered 2018-02-13 – 2018-02-14 (×2): 81 mg via ORAL
  Filled 2018-02-13 (×2): qty 1

## 2018-02-13 MED ORDER — MULTIVITAMINS PO CAPS: 1.0000 | ORAL_CAPSULE | Freq: Every day | ORAL | Status: DC

## 2018-02-13 MED ORDER — DIPHENHYDRAMINE HCL 25 MG PO CAPS: 25.0000 mg | ORAL_CAPSULE | Freq: Four times a day (QID) | ORAL | Status: DC | PRN

## 2018-02-13 MED ORDER — ACETAMINOPHEN 650 MG RE SUPP: 650.0000 mg | Freq: Four times a day (QID) | RECTAL | Status: DC | PRN

## 2018-02-13 NOTE — Progress Notes (Signed)
Pharmacy Note  Pharmacy asked to follow up add on Mag level and replace if low Pt admitted due to non-sustained VTach  Mag 1.6  Will order Mag 2 g IV x1 and recheck in AM Goal Mag >2 due to Rubye OaksVTach  Lexus Barletta, PharmD, BCPS Clinical Pharmacist 02/13/2018 3:36 PM

## 2018-02-13 NOTE — ED Notes (Signed)
Date and time results received: 02/13/18 0112 (use smartphrase ".now" to insert current time)  Test: Troponin Critical Value: 0.04  Name of Provider Notified: Dr. Zenda AlpersWebster  Orders Received? Or Actions Taken?:

## 2018-02-13 NOTE — Progress Notes (Signed)
[facility] revealed VT rate 170. And did not fire as rate did not achieve thus rate criteria reduced to 160, with back up overdrive pacing. Will d/c home on 400amio daily tomorrow and have f/u Friday 10 am.

## 2018-02-13 NOTE — Progress Notes (Signed)
Obtained report from ED nurse, stating heart rate is in the 50's and a blood sugar level of 365 without treatment. Notified physician about continuing the drip witt current heart rate and insulin orders for elevated blood sugar level. Dr. Sheryle Hailiamond will reassess if amiodarone drip is warranted.

## 2018-02-13 NOTE — ED Notes (Signed)
Patient transported to CT 

## 2018-02-13 NOTE — H&P (Addendum)
[name] is an 72 y.o. male.   Chief Complaint: Tachycardia HPI: The patient with past medical history of CAD, CHF, diabetes and CKD presents the emergency department due to tachycardia and weakness.  The patient states that he left a friend's home and was pumping gas when he began to feel lightheaded and dizzy.  He denies chest pain but admits to palpitations at the time.  He called a friend to drive him home at which point he was too weak to get out of the car.  He does not have any memory of the following events presently due to loss of consciousness.  The patient's wife called EMS who reported inability to obtain a blood pressure in the truck despite the fact that the patient was conscious albeit inconsistently lucid.  EKG showed V. tach as the patient was being transported from the truck to the emergency department.  The patient converted almost immediately upon arrival to the ED bay.  No shock was given.  Also, the patient's ICD did not fire.  He was started on amiodarone drip in the emergency department staff called the hospitalist service for admission.  Past Medical History:  Diagnosis Date  . AICD (automatic cardioverter/defibrillator) present 2010   battery changed out 2015  . Anginal pain (HCC)    infrequent pain. usually feels as pain in the arms  . Bilateral swelling of feet   . CHF (congestive heart failure) ([place])   . Chronic cough   . Chronic kidney disease    renal insufficiency.started on spironalactone  . COPD (chronic obstructive pulmonary disease) (HCC)    does not use o2  . Coronary artery disease   . Diabetes mellitus without complication ([place])   . Dysrhythmia   . GERD (gastroesophageal reflux disease)   . Hepatitis   . Hypercholesteremia   . Hypertension    history of,   . Myocardial infarction ([place]) 1993   several. per patient, has had 10 heart attacks  . Pneumonia   . Presence of permanent cardiac pacemaker    and defibrilator    Past Surgical History:   Procedure Laterality Date  . CARDIAC CATHETERIZATION     with stents  . CARDIAC DEFIBRILLATOR PLACEMENT  2010  . CATARACT EXTRACTION W/PHACO Left 02/23/2016   Procedure: CATARACT EXTRACTION PHACO AND INTRAOCULAR LENS PLACEMENT (IOC);  Surgeon: Birder Robson, MD;  Location: [facility] ORS;  Service: Ophthalmology;  Laterality: Left;  [place] 1.05 AP% 22.9 CDE 15.02 Fluid pack lot # [id number]  . CHOLECYSTECTOMY  2014  . CORONARY ARTERY BYPASS GRAFT    . feeding tube removal  2014  . GASTROSTOMY W/ FEEDING TUBE  2014   after galbladder surgery.  . LOWER EXTREMITY ANGIOGRAPHY Left 04/26/2017   Procedure: Lower Extremity Angiography;  Surgeon: Algernon Huxley, MD;  Location: [place];  Service: Cardiovascular;  Laterality: Left;  . LOWER EXTREMITY ANGIOGRAPHY Right 05/25/2017   Procedure: Lower Extremity Angiography;  Surgeon: Algernon Huxley, MD;  Location: [facility];  Service: Cardiovascular;  Laterality: Right;  . PANCREATIC CYST EXCISION  2014   had hepatitis at this time  . STENT PLACEMENT NON-VASCULAR ([facility] HX)     cardiac stents x many    History reviewed. No pertinent family history. Social History:  reports that he has been smoking.  He has a 55.00 pack-year smoking history. He has never used smokeless tobacco. He reports that he does not drink alcohol or use drugs.  Allergies:  Allergies  Allergen Reactions  . Other     Intolerant to blood thinners - pt states that he has had "internal bleeding" while on blood thinners  . Adhesive [Tape] Other (See Comments)    "PULLS SKIN OFF" PER PT REPORT. OK TO USE PAPER TAPE.    Medications Prior to Admission  Medication Sig Dispense Refill  . ACCU-CHEK SMARTVIEW test strip     . aspirin EC 81 MG tablet Take 81 mg by mouth daily.    . carvedilol (COREG) 25 MG tablet Take 25 mg by mouth 2 (two) times daily with a meal.    . clopidogrel (PLAVIX) 75 MG tablet Take 1 tablet (75 mg total) by mouth daily. (Patient not taking:  Reported on 05/25/2017) 30 tablet 11  . cyanocobalamin (,VITAMIN B-12,) 1000 MCG/ML injection Inject 1,000 mcg into the muscle every 30 (thirty) days.     . digoxin (LANOXIN) 0.125 MG tablet Take 0.125 mg by mouth daily.    . furosemide (LASIX) 20 MG tablet Take 20 mg by mouth daily.    Marland Kitchen gabapentin (NEURONTIN) 100 MG capsule Take 100 mg by mouth every morning.     . hydrOXYzine (VISTARIL) 25 MG capsule Take 1 capsule (25 mg total) by mouth every 6 (six) hours as needed. (Patient not taking: Reported on 05/23/2017) 30 capsule 0  . insulin glargine (LANTUS) 100 UNIT/ML injection Inject 30 Units into the skin at bedtime.    . insulin lispro protamine-lispro (HUMALOG 75/25 MIX) (75-25) 100 UNIT/ML SUSP injection Inject into the skin.    . Melatonin 10 MG TABS Take 10 mg by mouth at bedtime.     . Multiple Vitamin (MULTIVITAMIN) capsule Take 1 capsule by mouth daily.    . nitroGLYCERIN (NITRODUR - DOSED IN MG/24 HR) 0.1 mg/hr patch Place 0.1 mg onto the skin daily.     . nitroGLYCERIN (NITROSTAT) 0.3 MG SL tablet Place 0.3 mg under the tongue every 5 (five) minutes as needed for chest pain.    Marland Kitchen omeprazole (PRILOSEC) 40 MG capsule Take 40 mg by mouth daily.    . pravastatin (PRAVACHOL) 40 MG tablet Take 40 mg by mouth at bedtime.    Marland Kitchen RELION PEN NEEDLE 31G/8MM 31G X 8 MM MISC     . spironolactone (ALDACTONE) 25 MG tablet every morning before breakfast.    . traMADol (ULTRAM) 50 MG tablet Take 1 tablet (50 mg total) by mouth every 12 (twelve) hours as needed for moderate pain. (Patient not taking: Reported on 04/26/2017) 12 tablet 0    Results for orders placed or performed during the hospital encounter of 02/13/18 (from the past 48 hour(s))  CBC     Status: Abnormal   Collection Time: 02/13/18 12:29 AM  Result Value Ref Range   WBC 10.9 (H) 3.8 - 10.6 K/uL   RBC 4.67 4.40 - 5.90 MIL/uL   Hemoglobin 13.0 13.0 - 18.0 g/dL   HCT 39.7 (L) 40.0 - 52.0 %   MCV 85.0 80.0 - 100.0 fL   MCH 27.9 26.0 -  34.0 pg   MCHC 32.8 32.0 - 36.0 g/dL   RDW 15.5 (H) 11.5 - 14.5 %   Platelets 218 150 - 440 K/uL    Comment: Performed at [facility], [address] [place], [place] [address]  Comprehensive metabolic panel     Status: Abnormal   Collection Time: 02/13/18 12:29 AM  Result Value Ref Range   Sodium 136 135 - 145 mmol/L   Potassium 3.6 3.5 -  5.1 mmol/L   Chloride 104 98 - 111 mmol/L    Comment: Please note change in reference range.   CO2 21 (L) 22 - 32 mmol/L   Glucose, Bld 365 (H) 70 - 99 mg/dL    Comment: Please note change in reference range.   BUN 26 (H) 8 - 23 mg/dL    Comment: Please note change in reference range.   Creatinine, Ser 1.39 (H) 0.61 - 1.24 mg/dL   Calcium 8.7 (L) 8.9 - 10.3 mg/dL   Total Protein 7.2 6.5 - 8.1 g/dL   Albumin 3.7 3.5 - 5.0 g/dL   AST 35 15 - 41 U/L   ALT 16 0 - 44 U/L    Comment: Please note change in reference range.   Alkaline Phosphatase 117 38 - 126 U/L   Total Bilirubin 0.9 0.3 - 1.2 mg/dL   GFR calc non Af Amer 49 (L) >60 mL/min   GFR calc Af Amer 56 (L) >60 mL/min    Comment: (NOTE) The eGFR has been calculated using the CKD EPI equation. This calculation has not been validated in all clinical situations. eGFR's persistently <60 mL/min signify possible Chronic Kidney Disease.    Anion gap 11 5 - 15    Comment: Performed at [facility], [address] [place], [place] [address]  Troponin I     Status: Abnormal   Collection Time: 02/13/18 12:29 AM  Result Value Ref Range   Troponin I 0.04 (HH) <0.03 ng/mL    Comment: CRITICAL RESULT CALLED TO, READ BACK BY AND VERIFIED WITH KAYLA KEEN _0  02/13/18 [facility] Performed at [facility], [address] [place], [place] [address]   Protime-INR     Status: None   Collection Time: 02/13/18 12:29 AM  Result Value Ref Range   Prothrombin Time 14.3 11.4 - 15.2 seconds   INR 1.12     Comment: Performed at [facility], [address] [place],  [place] [address]  Glucose, capillary     Status: Abnormal   Collection Time: 02/13/18  3:10 AM  Result Value Ref Range   Glucose-Capillary 358 (H) 70 - 99 mg/dL   Dg Chest Portable 1 View  Result Date: 02/13/2018 CLINICAL DATA:  72 year old male with tachycardia. EXAM: PORTABLE CHEST 1 VIEW COMPARISON:  Chest radiograph dated 06/07/2017 FINDINGS: Stable cardiomegaly. No focal consolidation, pleural effusion or pneumothorax. There is median sternotomy wires and right pectoral AICD device. No acute osseous pathology. IMPRESSION: 1. No acute cardiopulmonary process. 2. Cardiomegaly. Electronically Signed   By: Anner Crete M.D.   On: 02/13/2018 01:17    Review of Systems  Constitutional: Negative for chills and fever.  HENT: Negative for sore throat and tinnitus.   Eyes: Negative for blurred vision and redness.  Respiratory: Negative for cough and shortness of breath.   Cardiovascular: Negative for chest pain, palpitations, orthopnea and PND.  Gastrointestinal: Negative for abdominal pain, diarrhea, nausea and vomiting.  Genitourinary: Negative for dysuria, frequency and urgency.  Musculoskeletal: Negative for joint pain and myalgias.  Skin: Negative for rash.       No lesions  Neurological: Negative for speech change, focal weakness and weakness.  Endo/Heme/Allergies: Does not bruise/bleed easily.       No temperature intolerance  Psychiatric/Behavioral: Negative for depression and suicidal ideas.    Blood pressure 116/73, pulse (!) 59, temperature 98.1 F (36.7 C), resp. rate 18, height _1  (1.651 m), weight 57.1 kg (125 lb 14.4 oz), SpO2 96 %.  Physical Exam  Vitals reviewed. Constitutional: He is oriented to person, place, and time. He appears well-developed and well-nourished. No distress.  HENT:  Head: Normocephalic and atraumatic.  Mouth/Throat: Oropharynx is clear and moist.  Eyes: Pupils are equal, round, and reactive to light. Conjunctivae and EOM are normal. No scleral  icterus.  Neck: Normal range of motion. Neck supple. No JVD present. No tracheal deviation present. No thyromegaly present.  Cardiovascular: Normal rate, regular rhythm and normal heart sounds. Exam reveals no gallop and no friction rub.  No murmur heard. Respiratory: Effort normal and breath sounds normal. No respiratory distress.  GI: Soft. Bowel sounds are normal. He exhibits no distension. There is no tenderness.  Genitourinary:  Genitourinary Comments: Deferred  Musculoskeletal: Normal range of motion. He exhibits no edema.  Lymphadenopathy:    He has no cervical adenopathy.  Neurological: He is alert and oriented to person, place, and time. No cranial nerve deficit.  Skin: Skin is warm and dry. No rash noted. No erythema.  Psychiatric: He has a normal mood and affect. His behavior is normal. Judgment and thought content normal.     Assessment/Plan This is a 72 year old male admitted for ventricular tachycardia. 1.  V. tach: Resolved; on amiodarone.  DC drip if heart rate less than 60.  Patient denies chest pain.  Continue digoxin.  Consult cardiology. 2.  CAD: Troponin barely elevated; continue aspirin and Plavix 3.  CKD: Mild acute on chronic kidney injury.  Avoid nephrotoxic agents. 4.  Diabetes mellitus type 2: Hold mixed insulin.  Continue basal insulin as well as sliding scale while hospitalized.   5.  CHF: Systolic; class IV.  Stable; continue furosemide and spironolactone.  Continue carvedilol as well 6.  Hyperlipidemia: Continue statin therapy 7.  DVT prophylaxis: Aspirin and SCDs (history of GI bleed prevents heparin or low weight molecular heparin) 8.  GI prophylaxis: Pantoprazole per home regimen The patient is a full code.  Time spent on admission orders and critical care approximately  Harrie Foreman, MD 02/13/2018, 5:03 AM

## 2018-02-13 NOTE — Progress Notes (Addendum)
Initial Nutrition Assessment  DOCUMENTATION CODES:   Severe malnutrition in context of chronic illness  INTERVENTION:   Premier Protein BID, each supplement provides 160 kcal and 30 grams of protein.   MVI daily  Liberalize diet  Pt at increased risk for nutrient deficiencies r/t his h/o gastrojejunostomy; recommend check thiamine, B12, folate, iron, calcium, zinc, copper, and vitamins D, A, E, & K yearly.   NUTRITION DIAGNOSIS:   Increased nutrient needs related to chronic illness(CHF, COPD, CKD, DM ) as evidenced by increased estimated needs.  GOAL:   Patient will meet greater than or equal to 90% of their needs  MONITOR:   PO intake, Supplement acceptance, Labs, Weight trends, I & O's, Skin  REASON FOR ASSESSMENT:   Consult Assessment of nutrition requirement/status  ASSESSMENT:   72 y/o male with a past medical history of CAD, CHF, diabetes, and CKD presents the emergency department due to tachycardia and weakness.    Met with pt in room today. Pt reports good appetite and oral intake up until yesterday. Pt reports that his appetite is good today. Pt ate 90% of his breakfast this morning. Pt reports that he is willing to drink supplements but that he does not drink them regularly at home. Pt denies any trouble chewing or swallowing. Per chart, pt has lost ~10lb(7%) but pt is unsure of the time frame in which the wt was lost. Pt with a h/o pancreatitis with pseudocyst and GOO; s/p gastrojejunostomy in 04/2013. At this time, pt had G-J tube placed and was dependent on nutrition support until 08/2013 when the tube was removed. Pt reports that he has not had any issues with eating since then. Pt reports he does take a daily MVI. RD discussed with pt how he is at increased risk for nutrient deficiencies r/t his gastrojejunostomy; recommend check thiamine, B12, folate, iron, calcium, zinc, copper, and vitamins D, A, E, & K yearly. RD will liberalize diet and add supplements to help  pt meet his estimated needs.    Medications reviewed and include: aspirin, plavix, colace, lasix, insulin, melatonin, MVI, protonix, prednisone, aldactone  Labs reviewed: K 3.6 wnl, BUN 26(H), creat 1.39(H), Ca 8.7(L) Wbc- 10.9(H) cbgs- 358, 335 x 24 hrs  NUTRITION - FOCUSED PHYSICAL EXAM:    Most Recent Value  Orbital Region  Mild depletion  Upper Arm Region  Severe depletion  Thoracic and Lumbar Region  Moderate depletion  Buccal Region  Mild depletion  Temple Region  Mild depletion  Clavicle Bone Region  Moderate depletion  Clavicle and Acromion Bone Region  Moderate depletion  Scapular Bone Region  Mild depletion  Dorsal Hand  Mild depletion  Patellar Region  Severe depletion  Anterior Thigh Region  Severe depletion  Posterior Calf Region  Severe depletion  Edema (RD Assessment)  Mild [BLE]  Hair  Reviewed  Eyes  Reviewed  Mouth  Reviewed  Skin  Reviewed  Nails  Reviewed     Diet Order:   Diet Order           Diet Heart Room service appropriate? Yes; Fluid consistency: Thin  Diet effective now         EDUCATION NEEDS:   Education needs have been addressed  Skin:  Skin Assessment: Reviewed RN Assessment  Last BM:  7/2  Height:   Ht Readings from Last 1 Encounters:  02/13/18 5' 5"  (1.651 m)    Weight:   Wt Readings from Last 1 Encounters:  02/13/18 125 lb 14.4 oz (57.1  kg)    Ideal Body Weight:  61.8 kg  BMI:  Body mass index is 20.95 kg/m.  Estimated Nutritional Needs:   Kcal:  1600-1900kcal/day   Protein:  85-97g/day   Fluid:  >1.4L/day   Koleen Distance MS, RD, LDN Pager #- [phone] Office#- [phone] After Hours Pager: [phone]

## 2018-02-13 NOTE — Progress Notes (Signed)
Inpatient Diabetes Program Recommendations  AACE/ADA: New Consensus Statement on Inpatient Glycemic Control (2015)  Target Ranges:  Prepandial:   less than 140 mg/dL      Peak postprandial:   less than 180 mg/dL (1-2 hours)      Critically ill patients:  140 - 180 mg/dL   Lab Results  Component Value Date   GLUCAP 225 (H) 02/13/2018   HGBA1C 12.4 (H) 02/13/2018    Review of Glycemic ControlResults for Luis AlertJONES, [name] (MRN [id number]) as of 02/13/2018 16:29  Ref. Range 05/25/2017 07:54 02/13/2018 03:10 02/13/2018 07:33 02/13/2018 11:31 02/13/2018 16:27  Glucose-Capillary Latest Ref Range: 70 - 99 mg/dL 409216 (H) 811358 (H) 914335 (H) 319 (H) 225 (H)    Diabetes history: Diabetes type 2 Outpatient Diabetes medications: NPH 14 units bid, Lantus 30 units q HS- I am unsure if patient is taking both??  Current orders for Inpatient glycemic control: Novolog sensitive tid with meals, Prednisone 20 mg bid, Lantus 18 units q HS   Inpatient Diabetes Program Recommendations:    Attempted to speak with patient regarding diabetes.  He states "I have a cramp".  Reported to RN.  Went back by to see patient and he was resting.  He states that his wife takes care of his medicine.  We discussed his increased A1C and he states that he does take insulin but only if blood sugars are "High".  Reviewed chart and last A1C with Dr. Dareen PianoAnderson in April of 2019 was 11.3%.    Patient likely needs continued education regarding diabetes and when to take insulin.  It will likely be helpful to include wife in these discussions as well.  Also need to clarify whether patient is taking only NPH or Lantus and NPH.  Will have diabetes coordinator follow up on 7/3.   Thanks,  Beryl MeagerJenny Rumaldo Difatta, RN, BC-ADM Inpatient Diabetes Coordinator Pager [phone] (8a-5p)

## 2018-02-13 NOTE — Plan of Care (Signed)
Patient is very weak, physical therapy evaluation is warranted. Patient is requesting snacks between meals.

## 2018-02-13 NOTE — Progress Notes (Signed)
Updated wife, rosemary

## 2018-02-13 NOTE — ED Triage Notes (Signed)
Pt arrived from home via EMS with complaints of "heart racing". Pt has an extensive heart Hx of stents, 10 heart attacks, and a pacemaker that did not go off according to EMS. Pt became diaphoretic, was nauseous, and vomiting. Pt received 6mg  of Adenosine and 150mg  of Amiodarone via EMS. Pt has a 20 in left AC per EMS. BS-407. Pt denies chest pain or SOB. Pt alert and oriented x 4. Pt on 2L nasal cannula for comfort.

## 2018-02-13 NOTE — Consult Note (Signed)
KONNAR BEN is a 72 y.o. male  [id number]  Primary Cardiologist: Leilani Able Reason for Consultation: Vt ach  HPI: 29 YOWM came with presyncope after non-sustained VTach.   Review of Systems: NO chest pain   Past Medical History:  Diagnosis Date  . AICD (automatic cardioverter/defibrillator) present 2010   battery changed out 2015  . Anginal pain (HCC)    infrequent pain. usually feels as pain in the arms  . Bilateral swelling of feet   . CHF (congestive heart failure) (HCC)   . Chronic cough   . Chronic kidney disease    renal insufficiency.started on spironalactone  . COPD (chronic obstructive pulmonary disease) (HCC)    does not use o2  . Coronary artery disease   . Diabetes mellitus without complication (HCC)   . Dysrhythmia   . GERD (gastroesophageal reflux disease)   . Hepatitis   . Hypercholesteremia   . Hypertension    history of,   . Myocardial infarction (HCC) 1993   several. per patient, has had 10 heart attacks  . Pneumonia   . Presence of permanent cardiac pacemaker    and defibrilator    Medications Prior to Admission  Medication Sig Dispense Refill  . ACCU-CHEK SMARTVIEW test strip     . aspirin EC 81 MG tablet Take 81 mg by mouth daily.    . carvedilol (COREG) 25 MG tablet Take 25 mg by mouth 2 (two) times daily with a meal.    . clopidogrel (PLAVIX) 75 MG tablet Take 1 tablet (75 mg total) by mouth daily. (Patient not taking: Reported on 05/25/2017) 30 tablet 11  . cyanocobalamin (,VITAMIN B-12,) 1000 MCG/ML injection Inject 1,000 mcg into the muscle every 30 (thirty) days.     . digoxin (LANOXIN) 0.125 MG tablet Take 0.125 mg by mouth daily.    . furosemide (LASIX) 20 MG tablet Take 20 mg by mouth daily.    Marland Kitchen gabapentin (NEURONTIN) 100 MG capsule Take 100 mg by mouth every morning.     . hydrOXYzine (VISTARIL) 25 MG capsule Take 1 capsule (25 mg total) by mouth every 6 (six) hours as needed. (Patient not taking: Reported on  05/23/2017) 30 capsule 0  . insulin glargine (LANTUS) 100 UNIT/ML injection Inject 30 Units into the skin at bedtime.    . insulin lispro protamine-lispro (HUMALOG 75/25 MIX) (75-25) 100 UNIT/ML SUSP injection Inject into the skin.    . Melatonin 10 MG TABS Take 10 mg by mouth at bedtime.     . Multiple Vitamin (MULTIVITAMIN) capsule Take 1 capsule by mouth daily.    . nitroGLYCERIN (NITRODUR - DOSED IN MG/24 HR) 0.1 mg/hr patch Place 0.1 mg onto the skin daily.     . nitroGLYCERIN (NITROSTAT) 0.3 MG SL tablet Place 0.3 mg under the tongue every 5 (five) minutes as needed for chest pain.    Marland Kitchen omeprazole (PRILOSEC) 40 MG capsule Take 40 mg by mouth daily.    . pravastatin (PRAVACHOL) 40 MG tablet Take 40 mg by mouth at bedtime.    Marland Kitchen RELION PEN NEEDLE 31G/8MM 31G X 8 MM MISC     . spironolactone (ALDACTONE) 25 MG tablet every morning before breakfast.    . traMADol (ULTRAM) 50 MG tablet Take 1 tablet (50 mg total) by mouth every 12 (twelve) hours as needed for moderate pain. (Patient not taking: Reported on 04/26/2017) 12 tablet 0     . aspirin EC  81 mg Oral Daily  .  carvedilol  25 mg Oral BID WC  . clopidogrel  75 mg Oral Daily  . digoxin  0.125 mg Oral Daily  . docusate sodium  100 mg Oral BID  . furosemide  20 mg Oral Daily  . gabapentin  100 mg Oral [id number]  . insulin aspart  0-9 Units Subcutaneous TID WC  . insulin glargine  18 Units Subcutaneous QHS  . Melatonin  10 mg Oral QHS  . multivitamin with minerals  1 tablet Oral Daily  . nitroGLYCERIN  0.1 mg Transdermal Daily  . pantoprazole  40 mg Oral Daily  . pravastatin  40 mg Oral QHS  . spironolactone  25 mg Oral Daily    Infusions: . amiodarone 30 mg/hr (02/13/18 0551)    Allergies  Allergen Reactions  . Other     Intolerant to blood thinners - pt states that he has had "internal bleeding" while on blood thinners  . Adhesive [Tape] Other (See Comments)    "PULLS SKIN OFF" PER PT REPORT. OK TO USE PAPER TAPE.    Social  History   Socioeconomic History  . Marital status: Married    Spouse name: Not on file  . Number of children: Not on file  . Years of education: Not on file  . Highest education level: Not on file  Occupational History  . Not on file  Social Needs  . Financial resource strain: Not on file  . Food insecurity:    Worry: Not on file    Inability: Not on file  . Transportation needs:    Medical: Not on file    Non-medical: Not on file  Tobacco Use  . Smoking status: Current Every Day Smoker    Packs/day: 1.00    Years: 55.00    Pack years: 55.00  . Smokeless tobacco: Never Used  Substance and Sexual Activity  . Alcohol use: No    Comment: rare  . Drug use: No  . Sexual activity: Never  Lifestyle  . Physical activity:    Days per week: Not on file    Minutes per session: Not on file  . Stress: Not on file  Relationships  . Social connections:    Talks on phone: Not on file    Gets together: Not on file    Attends religious service: Not on file    Active member of club or organization: Not on file    Attends meetings of clubs or organizations: Not on file    Relationship status: Not on file  . Intimate partner violence:    Fear of current or ex partner: Not on file    Emotionally abused: Not on file    Physically abused: Not on file    Forced sexual activity: Not on file  Other Topics Concern  . Not on file  Social History Narrative  . Not on file    History reviewed. No pertinent family history.  PHYSICAL EXAM: Vitals:   02/13/18 0329 02/13/18 0737  BP: 116/73 113/69  Pulse: (!) 59 60  Resp: 18   Temp: 98.1 F (36.7 C) 98 F (36.7 C)  SpO2: 96% 99%     Intake/Output Summary (Last 24 hours) at 02/13/2018 0912 Last data filed at 02/13/2018 0700 Gross per 24 hour  Intake 85.81 ml  Output 150 ml  Net -64.19 ml    General:  Well appearing. No respiratory difficulty HEENT: normal Neck: supple. no JVD. Carotids 2+ bilat; no bruits. No lymphadenopathy or  thryomegaly appreciated.  Cor: PMI nondisplaced. Regular rate & rhythm. No rubs, gallops or murmurs. Lungs: clear Abdomen: soft, nontender, nondistended. No hepatosplenomegaly. No bruits or masses. Good bowel sounds. Extremities: no cyanosis, clubbing, rash, edema Neuro: alert & oriented x 3, cranial nerves grossly intact. moves all 4 extremities w/o difficulty. Affect pleasant.  ECG: VVI paced rhythm  Results for orders placed or performed during the hospital encounter of 02/13/18 (from the past 24 hour(s))  CBC     Status: Abnormal   Collection Time: 02/13/18 12:29 AM  Result Value Ref Range   WBC 10.9 (H) 3.8 - 10.6 K/uL   RBC 4.67 4.40 - 5.90 MIL/uL   Hemoglobin 13.0 13.0 - 18.0 g/dL   HCT 16.139.7 (L) 09.640.0 - 04.552.0 %   MCV 85.0 80.0 - 100.0 fL   MCH 27.9 26.0 - 34.0 pg   MCHC 32.8 32.0 - 36.0 g/dL   RDW 40.915.5 (H) 81.111.5 - 91.414.5 %   Platelets 218 150 - 440 K/uL  Comprehensive metabolic panel     Status: Abnormal   Collection Time: 02/13/18 12:29 AM  Result Value Ref Range   Sodium 136 135 - 145 mmol/L   Potassium 3.6 3.5 - 5.1 mmol/L   Chloride 104 98 - 111 mmol/L   CO2 21 (L) 22 - 32 mmol/L   Glucose, Bld 365 (H) 70 - 99 mg/dL   BUN 26 (H) 8 - 23 mg/dL   Creatinine, Ser 7.821.39 (H) 0.61 - 1.24 mg/dL   Calcium 8.7 (L) 8.9 - 10.3 mg/dL   Total Protein 7.2 6.5 - 8.1 g/dL   Albumin 3.7 3.5 - 5.0 g/dL   AST 35 15 - 41 U/L   ALT 16 0 - 44 U/L   Alkaline Phosphatase 117 38 - 126 U/L   Total Bilirubin 0.9 0.3 - 1.2 mg/dL   GFR calc non Af Amer 49 (L) >60 mL/min   GFR calc Af Amer 56 (L) >60 mL/min   Anion gap 11 5 - 15  Troponin I     Status: Abnormal   Collection Time: 02/13/18 12:29 AM  Result Value Ref Range   Troponin I 0.04 (HH) <0.03 ng/mL  Protime-INR     Status: None   Collection Time: 02/13/18 12:29 AM  Result Value Ref Range   Prothrombin Time 14.3 11.4 - 15.2 seconds   INR 1.12   Glucose, capillary     Status: Abnormal   Collection Time: 02/13/18  3:10 AM  Result Value  Ref Range   Glucose-Capillary 358 (H) 70 - 99 mg/dL  TSH     Status: None   Collection Time: 02/13/18  5:11 AM  Result Value Ref Range   TSH 2.612 0.350 - 4.500 uIU/mL  Hemoglobin A1c     Status: Abnormal   Collection Time: 02/13/18  5:11 AM  Result Value Ref Range   Hgb A1c MFr Bld 12.4 (H) 4.8 - 5.6 %   Mean Plasma Glucose 309.18 mg/dL  Glucose, capillary     Status: Abnormal   Collection Time: 02/13/18  7:33 AM  Result Value Ref Range   Glucose-Capillary 335 (H) 70 - 99 mg/dL   Comment 1 Notify RN    Comment 2 Document in Chart    Dg Chest Portable 1 View  Result Date: 02/13/2018 CLINICAL DATA:  72 year old male with tachycardia. EXAM: PORTABLE CHEST 1 VIEW COMPARISON:  Chest radiograph dated 06/07/2017 FINDINGS: Stable cardiomegaly. No focal consolidation, pleural effusion or pneumothorax. There is median sternotomy wires and right pectoral  AICD device. No acute osseous pathology. IMPRESSION: 1. No acute cardiopulmonary process. 2. Cardiomegaly. Electronically Signed   By: Elgie Collard M.D.   On: 02/13/2018 01:17     ASSESSMENT AND PLAN: Non sustained V. Tach with near syncope and ICD did not discharge, advise ICD intergation and continue IV amiodrone and switch in AM to po 400 daily.  Alonzo Loving A

## 2018-02-13 NOTE — ED Provider Notes (Signed)
[facility] Emergency Department Provider Note   ____________________________________________   First MD Initiated Contact with Patient 02/13/18 [id number]     (approximate)  I have reviewed the triage vital signs and the nursing notes.   HISTORY  Chief Complaint Tachycardia    HPI [name] is a 72 y.o. male who comes into the hospital today from home.  He felt as though his heart was racing.  The patient has a pacemaker and a defibrillator but his defibrillator never went off.  The patient contacted EMS and they found that he was in ventricular tachycardia.  They report that he was vomiting when they arrived.  They gave him some adenosine with a concern of A. fib with aberrancy but it did not help.  The patient was then started on amiodarone 150 mg.  He has a history of 10 heart attacks.  The patient's blood sugar was 407.  He denies any chest pain or shortness of breath.  The patient is here today for evaluation of his symptoms.   Past Medical History:  Diagnosis Date  . AICD (automatic cardioverter/defibrillator) present 2010   battery changed out 2015  . Anginal pain (HCC)    infrequent pain. usually feels as pain in the arms  . Bilateral swelling of feet   . CHF (congestive heart failure) (HCC)   . Chronic cough   . Chronic kidney disease    renal insufficiency.started on spironalactone  . COPD (chronic obstructive pulmonary disease) (HCC)    does not use o2  . Coronary artery disease   . Diabetes mellitus without complication (HCC)   . Dysrhythmia   . GERD (gastroesophageal reflux disease)   . Hepatitis   . Hypercholesteremia   . Hypertension    history of,   . Myocardial infarction (HCC) 1993   several. per patient, has had 10 heart attacks  . Pneumonia   . Presence of permanent cardiac pacemaker    and defibrilator    Patient Active Problem List   Diagnosis Date Noted  . Hyperlipidemia 05/17/2017  . Hypertension 04/21/2017  .  Diabetes mellitus without complication (HCC) 04/21/2017  . Tobacco use disorder 04/21/2017  . Atherosclerosis of native arteries of extremity with intermittent claudication (HCC) 04/21/2017    Past Surgical History:  Procedure Laterality Date  . CARDIAC CATHETERIZATION     with stents  . CARDIAC DEFIBRILLATOR PLACEMENT  2010  . CATARACT EXTRACTION W/PHACO Left 02/23/2016   Procedure: CATARACT EXTRACTION PHACO AND INTRAOCULAR LENS PLACEMENT (IOC);  Surgeon: Galen ManilaWilliam Porfilio, MD;  Location: [facility] ORS;  Service: Ophthalmology;  Laterality: Left;  US 1.05 AP% 22.9 CDE 15.02 Fluid pack lot # [id number]  . CHOLECYSTECTOMY  2014  . CORONARY ARTERY BYPASS GRAFT    . feeding tube removal  2014  . GASTROSTOMY W/ FEEDING TUBE  2014   after galbladder surgery.  . LOWER EXTREMITY ANGIOGRAPHY Left 04/26/2017   Procedure: Lower Extremity Angiography;  Surgeon: Annice Needyew, Jason S, MD;  Location: [facility] INVASIVE CV LAB;  Service: Cardiovascular;  Laterality: Left;  . LOWER EXTREMITY ANGIOGRAPHY Right 05/25/2017   Procedure: Lower Extremity Angiography;  Surgeon: Annice Needyew, Jason S, MD;  Location: [facility] INVASIVE CV LAB;  Service: Cardiovascular;  Laterality: Right;  . PANCREATIC CYST EXCISION  2014   had hepatitis at this time  . STENT PLACEMENT NON-VASCULAR ([facility] HX)     cardiac stents x many    Prior to Admission medications   Medication Sig Start Date End Date Taking? Authorizing  Provider  ACCU-CHEK SMARTVIEW test strip  02/13/17   [provider]  aspirin EC 81 MG tablet Take 81 mg by mouth daily.    [provider]  carvedilol (COREG) 25 MG tablet Take 25 mg by mouth 2 (two) times daily with a meal.    [provider]  clopidogrel (PLAVIX) 75 MG tablet Take 1 tablet (75 mg total) by mouth daily. Patient not taking: Reported on 05/25/2017 04/26/17   Annice Needy, MD  cyanocobalamin (,VITAMIN B-12,) 1000 MCG/ML injection Inject 1,000 mcg into the muscle every 30 (thirty) days.  03/24/17  03/19/18  [provider]  digoxin (LANOXIN) 0.125 MG tablet Take 0.125 mg by mouth daily.    [provider]  furosemide (LASIX) 20 MG tablet Take 20 mg by mouth daily.    [provider]  gabapentin (NEURONTIN) 100 MG capsule Take 100 mg by mouth every morning.  03/23/17   [provider]  hydrOXYzine (VISTARIL) 25 MG capsule Take 1 capsule (25 mg total) by mouth every 6 (six) hours as needed. Patient not taking: Reported on 05/23/2017 07/16/16   Evon Slack, PA-C  insulin glargine (LANTUS) 100 UNIT/ML injection Inject 30 Units into the skin at bedtime.    [provider]  insulin lispro protamine-lispro (HUMALOG 75/25 MIX) (75-25) 100 UNIT/ML SUSP injection Inject into the skin. 03/28/17   [provider]  Melatonin 10 MG TABS Take 10 mg by mouth at bedtime.     [provider]  Multiple Vitamin (MULTIVITAMIN) capsule Take 1 capsule by mouth daily.    [provider]  nitroGLYCERIN (NITRODUR - DOSED IN MG/24 HR) 0.1 mg/hr patch Place 0.1 mg onto the skin daily.     [provider]  nitroGLYCERIN (NITROSTAT) 0.3 MG SL tablet Place 0.3 mg under the tongue every 5 (five) minutes as needed for chest pain.    [provider]  omeprazole (PRILOSEC) 40 MG capsule Take 40 mg by mouth daily.    [provider]  pravastatin (PRAVACHOL) 40 MG tablet Take 40 mg by mouth at bedtime.    [provider]  RELION PEN NEEDLE 31G/8MM 31G X 8 MM MISC  03/28/17   [provider]  spironolactone (ALDACTONE) 25 MG tablet every morning before breakfast. 05/17/16   [provider]  traMADol (ULTRAM) 50 MG tablet Take 1 tablet (50 mg total) by mouth every 12 (twelve) hours as needed for moderate pain. Patient not taking: Reported on 04/26/2017 03/16/16   Joni Reining, PA-C    Allergies Other and Adhesive [tape]  History reviewed. No pertinent family history.  Social History Social History    Tobacco Use  . Smoking status: Current Every Day Smoker    Packs/day: 1.00    Years: 55.00    Pack years: 55.00  . Smokeless tobacco: Never Used  Substance Use Topics  . Alcohol use: No    Comment: rare  . Drug use: No    Review of Systems  Constitutional: No fever/chills Eyes: No visual changes. ENT: No sore throat. Cardiovascular: Palpitations Respiratory: Denies shortness of breath. Gastrointestinal: Nausea and vomiting with no abdominal pain.  No diarrhea.  No constipation. Genitourinary: Negative for dysuria. Musculoskeletal: Negative for back pain. Skin: Negative for rash. Neurological: No dizziness or lightheadedness   ____________________________________________   PHYSICAL EXAM:  VITAL SIGNS: ED Triage Vitals  Enc Vitals Group     BP 02/13/18 0030 (!) 88/60     Pulse Rate 02/13/18 0030  66     Resp 02/13/18 0030 (!) 24     Temp 02/13/18 0033 (!) 96.2 F (35.7 C)     Temp Source 02/13/18 0033 Axillary     SpO2 02/13/18 0030 100 %     Weight 02/13/18 0034 124 lb (56.2 kg)     Height 02/13/18 0034 5\' 5"  (1.651 m)     Head Circumference --      Peak Flow --      Pain Score 02/13/18 0034 0     Pain Loc --      Pain Edu? --      Excl. in GC? --     Constitutional: Alert and oriented. Ill appearing and in moderate distress. Eyes: Conjunctivae are normal. PERRL. EOMI. Head: Atraumatic. Nose: No congestion/rhinnorhea. Mouth/Throat: Mucous membranes are moist.  Oropharynx non-erythematous. Cardiovascular: Tachycardia, regular rhythm. Grossly normal heart sounds.  Weak radial pulses Respiratory: Normal respiratory effort.  No retractions. Lungs CTAB. Gastrointestinal: Soft and nontender. No distention.  Positive bowel sounds. Musculoskeletal: No lower extremity tenderness nor edema.  Neurologic:  Normal speech and language.  Skin:  Skin is warm, dry and intact.  Nonblanching maculopapular rash to the patient's abdomen with eschars in the  center. Psychiatric: Mood and affect are normal.   ____________________________________________   LABS (all labs ordered are listed, but only abnormal results are displayed)  Labs Reviewed  CBC - Abnormal; Notable for the following components:      Result Value   WBC 10.9 (*)    HCT 39.7 (*)    RDW 15.5 (*)    All other components within normal limits  COMPREHENSIVE METABOLIC PANEL - Abnormal; Notable for the following components:   CO2 21 (*)    Glucose, Bld 365 (*)    BUN 26 (*)    Creatinine, Ser 1.39 (*)    Calcium 8.7 (*)    GFR calc non Af Amer 49 (*)    GFR calc Af Amer 56 (*)    All other components within normal limits  TROPONIN I - Abnormal; Notable for the following components:   Troponin I 0.04 (*)    All other components within normal limits  PROTIME-INR   ____________________________________________  EKG  ED ECG REPORT I, Rebecka Apley, the attending physician, personally viewed and interpreted this ECG.   Date: 02/13/2018  EKG Time:0029  Rate: 64  Rhythm: AV paced rhythm  Axis: Normal  Intervals:Prolonged QTC  ST&T Change: AV paced rhythm  ____________________________________________  RADIOLOGY  ED MD interpretation: Chest x-ray: No acute cardiopulmonary process, cardiomegaly  Official radiology report(s): Dg Chest Portable 1 View  Result Date: 02/13/2018 CLINICAL DATA:  72 year old male with tachycardia. EXAM: PORTABLE CHEST 1 VIEW COMPARISON:  Chest radiograph dated 06/07/2017 FINDINGS: Stable cardiomegaly. No focal consolidation, pleural effusion or pneumothorax. There is median sternotomy wires and right pectoral AICD device. No acute osseous pathology. IMPRESSION: 1. No acute cardiopulmonary process. 2. Cardiomegaly. Electronically Signed   By: Elgie Collard M.D.   On: 02/13/2018 01:17    ____________________________________________   PROCEDURES  Procedure(s) performed: please, see procedure note(s).  .Critical Care Performed  by: Rebecka Apley, MD Authorized by: Rebecka Apley, MD   Critical care provider statement:    Critical care time (minutes):  30   Critical care start time:  02/13/2018 12:27 AM   Critical care end time:  02/13/2018 1:00 AM   Critical care time was exclusive of:  Separately billable procedures and treating other patients  Critical care was necessary to treat or prevent imminent or life-threatening deterioration of the following conditions:  Cardiac failure   Critical care was time spent personally by me on the following activities:  Development of treatment plan with patient or surrogate, evaluation of patient's response to treatment, examination of patient, obtaining history from patient or surrogate, ordering and performing treatments and interventions, ordering and review of laboratory studies, ordering and review of radiographic studies, pulse oximetry, re-evaluation of patient's condition and review of old charts   I assumed direction of critical care for this patient from another provider in my specialty: no      Critical Care performed: Yes, see critical care note(s)  ____________________________________________   INITIAL IMPRESSION / ASSESSMENT AND PLAN / ED COURSE  As part of my medical decision making, I reviewed the following data within the electronic MEDICAL RECORD NUMBER Notes from prior ED visits and [place] Controlled Substance Database   This is a 72 year old male who comes into the hospital today with ventricular tachycardia.  EMS did send an EKG and it showed ventricular tachycardia.  When the patient arrived he was still in V. tach although he had amiodarone running.  By the time we were transferring the patient from the stretcher onto the bed the patient's arrhythmia broke to an AV paced rhythm at a rate in the 60s.  I did check some blood work on the patient to include a CBC, CMP, troponin, PT/INR.  The patient's glucose is 365 and the patient's troponin is elevated at  0.04.  The patient's creatinine is 1.39 which is slightly more elevated than normal.  We continued the patient on an amiodarone drip.  The patient will be admitted to the hospitalist service for further evaluation.      ____________________________________________   FINAL CLINICAL IMPRESSION(S) / ED DIAGNOSES  Final diagnoses:  Palpitations  Ventricular tachycardia [facility])     ED Discharge Orders    None       Note:  This document was prepared using Dragon voice recognition software and may include unintentional dictation errors.    Rebecka Apley, MD 02/13/18 [phone]

## 2018-02-13 NOTE — Progress Notes (Signed)
Per Dr. Welton FlakesKhan, patient will need AICD interrogated.  He will need to stay on amio gtt until this happens.    Paged [employer] for interrogation.  Awaiting callback.

## 2018-02-13 NOTE — Progress Notes (Signed)
[employer] rep came out (ADAM) and could not interrogate ICD. Apparently device is [employer].  [employer] was paged.  Awaiting callback.

## 2018-02-13 NOTE — Progress Notes (Signed)
[employer] returned call.  Will send appropriate people for interrogation of AICD.

## 2018-02-13 NOTE — Progress Notes (Signed)
Patient has BiV ICD and is [facility].  Device recorded roughly 10 minutes of VT at a rate of 170bpm last night at around 11pm.  It is possible that there was further VT at rates lower than that due to first monitor only zone being set at 170.  I spoke to Dr. Welton FlakesKhan and he asked that the following changes be made:  VT zone from 170 (monitor only) to 160 with ATP and shocks.    VF zone of 200 remains the same.  I will also let our [employer] reps at [facility] know of the changes.  [facility] [employer] [phone]

## 2018-02-13 NOTE — Progress Notes (Addendum)
[facility] - [place] at [facility]   PATIENT NAME: [name]    MR#:  [id number]  DATE OF BIRTH:  12-Jun-1946  SUBJECTIVE:  CHIEF COMPLAINT:  Pt denies any dizziness or chest discomfort  REVIEW OF SYSTEMS:  CONSTITUTIONAL: No fever, fatigue or weakness.  EYES: No blurred or double vision.  EARS, NOSE, AND THROAT: No tinnitus or ear pain.  RESPIRATORY: No cough, shortness of breath, wheezing or hemoptysis.  CARDIOVASCULAR: No chest pain, orthopnea, edema.  GASTROINTESTINAL: No nausea, vomiting, diarrhea or abdominal pain.  GENITOURINARY: No dysuria, hematuria.  ENDOCRINE: No polyuria, nocturia,  HEMATOLOGY: No anemia, easy bruising or bleeding SKIN: No rash or lesion. MUSCULOSKELETAL: No joint pain or arthritis.   NEUROLOGIC: No tingling, numbness, weakness.  PSYCHIATRY: No anxiety or depression.   DRUG ALLERGIES:   Allergies  Allergen Reactions  . Other     Intolerant to blood thinners - pt states that he has had "internal bleeding" while on blood thinners  . Adhesive [Tape] Other (See Comments)    "PULLS SKIN OFF" PER PT REPORT. OK TO USE PAPER TAPE.    VITALS:  Blood pressure 116/62, pulse (!) 117, temperature 98 F (36.7 C), temperature source Oral, resp. rate 18, height 5\' 5"  (1.651 m), weight 57.1 kg (125 lb 14.4 oz), SpO2 97 %.  PHYSICAL EXAMINATION:  GENERAL:  72 y.o.-year-old patient lying in the bed with no acute distress.  EYES: Pupils equal, round, reactive to light and accommodation. No scleral icterus. Extraocular muscles intact.  HEENT: Head atraumatic, normocephalic. Oropharynx and nasopharynx clear.  NECK:  Supple, no jugular venous distention. No thyroid enlargement, no tenderness.  LUNGS: Normal breath sounds bilaterally, no wheezing, rales,rhonchi or crepitation. No use of accessory muscles of respiration.  CARDIOVASCULAR: S1, S2 normal. No murmurs, rubs, or gallops.  ABDOMEN: Soft, nontender, nondistended. Bowel sounds  present. No organomegaly or mass.  EXTREMITIES: No pedal edema, cyanosis, or clubbing.  NEUROLOGIC: Cranial nerves II through XII are intact. Muscle strength 5/5 in all extremities. Sensation intact. Gait not checked.  PSYCHIATRIC: The patient is alert and oriented x 3.  SKIN: No obvious rash, lesion, or ulcer.    LABORATORY PANEL:   CBC Recent Labs  Lab 02/13/18 0029  WBC 10.9*  HGB 13.0  HCT 39.7*  PLT 218   ------------------------------------------------------------------------------------------------------------------  Chemistries  Recent Labs  Lab 02/13/18 0029 02/13/18 0505  NA 136  --   K 3.6  --   CL 104  --   CO2 21*  --   GLUCOSE 365*  --   BUN 26*  --   CREATININE 1.39*  --   CALCIUM 8.7*  --   MG  --  1.6*  AST 35  --   ALT 16  --   ALKPHOS 117  --   BILITOT 0.9  --    ------------------------------------------------------------------------------------------------------------------  Cardiac Enzymes Recent Labs  Lab 02/13/18 0029  TROPONINI 0.04*   ------------------------------------------------------------------------------------------------------------------  RADIOLOGY:  Dg Chest Portable 1 View  Result Date: 02/13/2018 CLINICAL DATA:  72 year old male with tachycardia. EXAM: PORTABLE CHEST 1 VIEW COMPARISON:  Chest radiograph dated 06/07/2017 FINDINGS: Stable cardiomegaly. No focal consolidation, pleural effusion or pneumothorax. There is median sternotomy wires and right pectoral AICD device. No acute osseous pathology. IMPRESSION: 1. No acute cardiopulmonary process. 2. Cardiomegaly. Electronically Signed   By: Elgie Collard M.D.   On: 02/13/2018 01:17    EKG:   Orders placed or performed during the hospital encounter of 02/13/18  .  ED EKG  . EKG 12-Lead  . EKG 12-Lead  . ED EKG    ASSESSMENT AND PLAN:     This is a 72 year old male admitted for ventricular tachycardia. 1.  V. tach: Resolved; on amiodarone gtt   DC drip tomorrow  and change to PO   Continue digoxin. [facility]og[facility] revealed VT rate 170. And did not fire as rate did not achieve thus rate criteria reduced to 160, with back up overdrive pacing.    Appreciate dr. Welton FlakesKhan cardiology recommendations, f/u with him Friday at 10 am  2.  CAD: pt asymptomatic Troponin barely elevated; continue aspirin and Plavix  3.  CKD: Mild acute on chronic kidney injury.  Avoid nephrotoxic agents.  4.  Diabetes mellitus type 2: Hold mixed insulin.  Continue basal insulin as well as sliding scale while hospitalized.    5.  CHF: Systolic; class IV.  Stable; continue furosemide and spironolactone.  Continue carvedilol as well  6.  Hyperlipidemia: Continue statin therapy  Dark urine, u/a neg, urine cx pending  7.  DVT prophylaxis: Aspirin and SCDs (history of GI bleed prevents heparin or low weight molecular heparin)  8.  GI prophylaxis: Pantoprazole per home regimen       All the records are reviewed and case discussed with Care Management/Social Workerr. Management plans discussed with the patient, family and they are in agreement.  CODE STATUS: fc   TOTAL TIME TAKING CARE OF THIS PATIENT: 34 minutes.   POSSIBLE D/C IN 1 DAYS, DEPENDING ON CLINICAL CONDITION.  Note: This dictation was prepared with Dragon dictation along with smaller phrase technology. Any transcriptional errors that result from this process are unintentional.   Ramonita LabAruna Weylyn Ricciuti M.D on 02/13/2018 at 9:41 PM  Between 7am to 6pm - Pager - [phone] After 6pm go to [email or web address] - password [email or web address] [facility]  [facility]  Office  [phone]  CC: Primary care physician; Lauro RegulusAnderson, Marshall W, MD

## 2018-02-14 LAB — GLUCOSE, CAPILLARY
Glucose-Capillary: 432 mg/dL — ABNORMAL HIGH (ref 70–99)
Glucose-Capillary: 420 mg/dL — ABNORMAL HIGH (ref 70–99)
Glucose-Capillary: 430 mg/dL — ABNORMAL HIGH (ref 70–99)
Glucose-Capillary: 343 mg/dL — ABNORMAL HIGH (ref 70–99)
Glucose-Capillary: 259 mg/dL — ABNORMAL HIGH (ref 70–99)

## 2018-02-14 LAB — MAGNESIUM: Magnesium: 1.9 mg/dL (ref 1.7–2.4)

## 2018-02-14 MED ORDER — INSULIN GLARGINE 100 UNIT/ML ~~LOC~~ SOLN
24.0000 [IU] | Freq: Every day | SUBCUTANEOUS | Status: DC
  Filled 2018-02-14: qty 0.24

## 2018-02-14 MED ORDER — INSULIN ASPART 100 UNIT/ML ~~LOC~~ SOLN
4.0000 [IU] | Freq: Three times a day (TID) | SUBCUTANEOUS | Status: DC
  Administered 2018-02-14 (×2): 4 [IU] via SUBCUTANEOUS
  Filled 2018-02-14 (×2): qty 1

## 2018-02-14 MED ORDER — INSULIN NPH (HUMAN) (ISOPHANE) 100 UNIT/ML ~~LOC~~ SUSP: 18.0000 [IU] | Freq: Two times a day (BID) | SUBCUTANEOUS | 11 refills | Status: DC

## 2018-02-14 MED ORDER — MAGNESIUM SULFATE 2 GM/50ML IV SOLN
2.0000 g | Freq: Once | INTRAVENOUS | Status: AC
  Administered 2018-02-14: 2 g via INTRAVENOUS
  Filled 2018-02-14: qty 50

## 2018-02-14 MED ORDER — INSULIN ASPART 100 UNIT/ML ~~LOC~~ SOLN
0.0000 [IU] | Freq: Three times a day (TID) | SUBCUTANEOUS | Status: DC
  Administered 2018-02-14: 15 [IU] via SUBCUTANEOUS
  Administered 2018-02-14: 8 [IU] via SUBCUTANEOUS
  Filled 2018-02-14 (×2): qty 1

## 2018-02-14 MED ORDER — INSULIN ASPART 100 UNIT/ML ~~LOC~~ SOLN
SUBCUTANEOUS | Status: AC
  Filled 2018-02-14: qty 1

## 2018-02-14 MED ORDER — POTASSIUM CHLORIDE CRYS ER 20 MEQ PO TBCR
40.0000 meq | EXTENDED_RELEASE_TABLET | Freq: Once | ORAL | Status: AC
  Administered 2018-02-14: 40 meq via ORAL
  Filled 2018-02-14: qty 2

## 2018-02-14 MED ORDER — INSULIN ASPART 100 UNIT/ML ~~LOC~~ SOLN
25.0000 [IU] | Freq: Once | SUBCUTANEOUS | Status: AC
  Administered 2018-02-14: 25 [IU] via SUBCUTANEOUS

## 2018-02-14 MED ORDER — INSULIN ASPART 100 UNIT/ML ~~LOC~~ SOLN: 0.0000 [IU] | Freq: Every day | SUBCUTANEOUS | Status: DC

## 2018-02-14 MED ORDER — AMIODARONE HCL 400 MG PO TABS: 400.0000 mg | ORAL_TABLET | Freq: Every day | ORAL | 0 refills | Status: DC

## 2018-02-14 MED ORDER — INSULIN GLARGINE 100 UNIT/ML ~~LOC~~ SOLN: 22.0000 [IU] | Freq: Every day | SUBCUTANEOUS | Status: DC

## 2018-02-14 MED ORDER — NITROGLYCERIN 0.1 MG/HR TD PT24: 0.1000 mg | MEDICATED_PATCH | Freq: Every day | TRANSDERMAL | 1 refills | Status: DC | PRN

## 2018-02-14 MED ORDER — INSULIN ASPART 100 UNIT/ML ~~LOC~~ SOLN
6.0000 [IU] | Freq: Once | SUBCUTANEOUS | Status: AC
  Administered 2018-02-14: 6 [IU] via SUBCUTANEOUS
  Filled 2018-02-14: qty 1

## 2018-02-14 NOTE — Progress Notes (Signed)
Patient presently resting in the bed, alert and oriented, denies any pain at this time, BS 259, MD notified, patient is being discharged  Home today, discharge instruction provided, iv removed tele removed, patient is waiting for wife to take him home

## 2018-02-14 NOTE — Discharge Instructions (Signed)
Take your Insulin regularly to keep sugars under control

## 2018-02-14 NOTE — Progress Notes (Addendum)
Inpatient Diabetes Program Recommendations  AACE/ADA: New Consensus Statement on Inpatient Glycemic Control (2015)  Target Ranges:  Prepandial:   less than 140 mg/dL      Peak postprandial:   less than 180 mg/dL (1-2 hours)      Critically ill patients:  140 - 180 mg/dL   Results for [name], [name] (MRN [id number]) as of 02/14/2018 10:38  Ref. Range 02/13/2018 07:33 02/13/2018 11:31 02/13/2018 16:27 02/13/2018 21:05  Glucose-Capillary Latest Ref Range: 70 - 99 mg/dL 335 (H)  7 units NOVOLOG  319 (H)  7 units NOVOLOG  225 (H)  3 units NOVOLOG  275 (H)    18 units LANTUS   Results for [name], [name] (MRN [id number]) as of 02/14/2018 10:38  Ref. Range 02/14/2018 08:00  Glucose-Capillary Latest Ref Range: 70 - 99 mg/dL 432 (H)  15 units NOVOLOG    Results for [name], [name] (MRN [id number]) as of 02/14/2018 10:38  Ref. Range 02/13/2018 05:11  Hemoglobin A1C Latest Ref Range: 4.8 - 5.6 % 12.4 (H)  Average glucose 309 mg/dl     Home DM Meds: Lantus 30 units QHS       NPH Insulin- 14 units BID  Current Insulin Orders: Lantus 18 units QHS      Novolog Moderate Correction Scale/ SSI (0-15 units) TID AC + HS            Not sure if patient is taking NPH Insulin + Lantus at home.  Per DM Coordinator notes from yesterday, pt states wife is in charge of giving him his medications.  Pt also stated he only takes insulin when CBGs are "high".  PCP: Dr. Frazier Richards from [place] seen 11/23/2017.  Prednisone 20 mg BID likely also contributing to poor glucose control in hospital.      MD- Please consider the following in-hospital insulin adjustments:  1. Increase Lantus to 24 units QHS (0.4 units/kg dosing)  2. Start Novolog Meal Coverage: Novolog 4 units TID with meals (Please add the following Hold Parameters: Hold if pt eats <50% of meal, Hold if pt NPO)    Addendum 1pm- Met with patient earlier this AM.  Pt called wife on phone and I was able to speak with her since she  administers all meds.  Wife told me pt is supposed to be taking NPH Insulin 14 units BID.  Pt often refuses to take the insulin b/c he tells his wife it makes him feel bad when his CBGs drop to the 120-130 mg/dl range.  Encouraged wife to give pt insulin as possible, as pt's A1c is elevated and constantly elevated CBGs can lead to dehydration and long-term complications.  Encouraged pt and wife to check pt's CBGs at least BID before taking insulin and also prn whe he feels "bad".  Explained to pt that if we can get his CBGs to stay in the "safe" or "normal" range, then he will most likely not feel bad when his CBGs are in the 120-130 mg/dl range.  Unsure pt is willing to be more vigilant with his diabetes care.       --Will follow patient during hospitalization--  Wyn Quaker RN, MSN, CDE Diabetes Coordinator Inpatient Glycemic Control Team Team Pager: [phone] (8a-5p)

## 2018-02-14 NOTE — Progress Notes (Signed)
Patient blood glucose 432, Dr patel paged , waiting for replied

## 2018-02-14 NOTE — Discharge Summary (Signed)
[facility] - [place] at [facility]   PATIENT NAME: [name]    MR#:  [id number]  DATE OF BIRTH:  09-02-1945  DATE OF ADMISSION:  02/13/2018 ADMITTING PHYSICIAN: Arnaldo Natal, MD  DATE OF DISCHARGE: 02/14/2018  PRIMARY CARE PHYSICIAN: Lauro Regulus, MD    ADMISSION DIAGNOSIS:  Palpitations [R00.2] Ventricular tachycardia (HCC) [I47.2]  DISCHARGE DIAGNOSIS:   Ventricular Tachyarrhythmia  SECONDARY DIAGNOSIS:   Past Medical History:  Diagnosis Date  . AICD (automatic cardioverter/defibrillator) present 2010   battery changed out 2015  . Anginal pain (HCC)    infrequent pain. usually feels as pain in the arms  . Bilateral swelling of feet   . CHF (congestive heart failure) (HCC)   . Chronic cough   . Chronic kidney disease    renal insufficiency.started on spironalactone  . COPD (chronic obstructive pulmonary disease) (HCC)    does not use o2  . Coronary artery disease   . Diabetes mellitus without complication (HCC)   . Dysrhythmia   . GERD (gastroesophageal reflux disease)   . Hepatitis   . Hypercholesteremia   . Hypertension    history of,   . Myocardial infarction (HCC) 1993   several. per patient, has had 10 heart attacks  . Pneumonia   . Presence of permanent cardiac pacemaker    and defibrilator    HOSPITAL COURSE:   72 year old male admitted for ventricular tachycardia. 1. V. tach: Resolved; on amiodarone gtt ---now changed to po Amiodarone 400 mg qd per cardiology recommendation  Continue digoxin. And BB Spoke with Dr Welton Flakes about meds Patient was seen by [facility] scintific andd interogation revealed VT rate 170. And did not fire as rate did not achieve thus rate criteria reduced to 160, with back up overdrive pacing.   Appreciate dr. Welton Flakes cardiology recommendations, f/u with him Friday 02/16/18 at 10 am  2. CAD: pt asymptomatic Troponin barely elevated; continue aspirin \  3. CKD: Mild acute on chronic kidney  injury. Avoid nephrotoxic agents.  4. Diabetes mellitus type 2: Hold mixed insulin. Continue basal insulin as well as sliding scale while hospitalized.  Sugars high due to pt noncompliance at home Increased Insulin NPH to 18 unit Bid. PCP to change to lantus and short acting aspart with meals.  5. CHF: Systolic; class IV. Stable; continue furosemide and spironolactone.  Continue BB  6. Hyperlipidemia: Continue statin therapy  7.DVT prophylaxis: Aspirin and SCDs (history of GI bleed preventsheparin or low weight molecular heparin)  8. GI prophylaxis: Pantoprazole per home regimen  Will d/c home later today once sugars better CONSULTS OBTAINED:  Treatment Team:  Laurier Nancy, MD  DRUG ALLERGIES:   Allergies  Allergen Reactions  . Other     Intolerant to blood thinners - pt states that he has had "internal bleeding" while on blood thinners  . Adhesive [Tape] Other (See Comments)    "PULLS SKIN OFF" PER PT REPORT. OK TO USE PAPER TAPE.    DISCHARGE MEDICATIONS:   Allergies as of 02/14/2018      Reactions   Other    Intolerant to blood thinners - pt states that he has had "internal bleeding" while on blood thinners   Adhesive [tape] Other (See Comments)   "PULLS SKIN OFF" PER PT REPORT. OK TO USE PAPER TAPE.      Medication List    STOP taking these medications   clopidogrel 75 MG tablet Commonly known as:  PLAVIX     TAKE these  medications   amiodarone 400 MG tablet Commonly known as:  PACERONE Take 1 tablet (400 mg total) by mouth daily. Start taking on:  02/15/2018   aspirin EC 81 MG tablet Take 81 mg by mouth daily.   digoxin 0.125 MG tablet Commonly known as:  LANOXIN Take 0.125 mg by mouth daily.   furosemide 20 MG tablet Commonly known as:  LASIX Take 20 mg by mouth daily as needed for fluid.   insulin NPH Human 100 UNIT/ML injection Commonly known as:  HUMULIN N,NOVOLIN N Inject 0.18 mLs (18 Units total) into the skin 2 (two)  times daily before a meal. What changed:  how much to take   Melatonin 5 MG Tabs Take 10 mg by mouth at bedtime.   metoprolol succinate 50 MG 24 hr tablet Commonly known as:  TOPROL-XL Take 50 mg by mouth 2 (two) times daily.   multivitamin capsule Take 1 capsule by mouth daily.   nitroGLYCERIN 0.4 MG SL tablet Commonly known as:  NITROSTAT Place 0.4 mg under the tongue every 5 (five) minutes as needed for chest pain. What changed:  Another medication with the same name was changed. Make sure you understand how and when to take each.   nitroGLYCERIN 0.1 mg/hr patch Commonly known as:  NITRODUR - Dosed in mg/24 hr Place 1 patch (0.1 mg total) onto the skin daily as needed. What changed:    when to take this  reasons to take this   omeprazole 40 MG capsule Commonly known as:  PRILOSEC Take 40 mg by mouth daily.   pravastatin 40 MG tablet Commonly known as:  PRAVACHOL Take 40 mg by mouth at bedtime.   spironolactone 25 MG tablet Commonly known as:  ALDACTONE Take 25 mg by mouth every morning before breakfast.   triamcinolone cream 0.5 % Commonly known as:  KENALOG Apply 1 application topically 2 (two) times daily.       If you experience worsening of your admission symptoms, develop shortness of breath, life threatening emergency, suicidal or homicidal thoughts you must seek medical attention immediately by calling 911 or calling your MD immediately  if symptoms less severe.  You Must read complete instructions/literature along with all the possible adverse reactions/side effects for all the Medicines you take and that have been prescribed to you. Take any new Medicines after you have completely understood and accept all the possible adverse reactions/side effects.   Please note  You were cared for by a hospitalist during your hospital stay. If you have any questions about your discharge medications or the care you received while you were in the hospital after you are  discharged, you can call the unit and asked to speak with the hospitalist on call if the hospitalist that took care of you is not available. Once you are discharged, your primary care physician will handle any further medical issues. Please note that NO REFILLS for any discharge medications will be authorized once you are discharged, as it is imperative that you return to your primary care physician (or establish a relationship with a primary care physician if you do not have one) for your aftercare needs so that they can reassess your need for medications and monitor your lab values. Today   SUBJECTIVE   No new complaints No cp or palpitations  VITAL SIGNS:  Blood pressure 100/73, pulse (!) 59, temperature 98.4 F (36.9 C), temperature source Oral, resp. rate 14, height 5\' 5"  (1.651 m), weight 59.1 kg (130 lb 4.8  oz), SpO2 94 %.  I/O:    Intake/Output Summary (Last 24 hours) at 02/14/2018 1659 Last data filed at 02/14/2018 1410 Gross per 24 hour  Intake 720 ml  Output 1980 ml  Net -1260 ml    PHYSICAL EXAMINATION:  GENERAL:  72 y.o.-year-old patient lying in the bed with no acute distress.  EYES: Pupils equal, round, reactive to light and accommodation. No scleral icterus. Extraocular muscles intact.  HEENT: Head atraumatic, normocephalic. Oropharynx and nasopharynx clear.  NECK:  Supple, no jugular venous distention. No thyroid enlargement, no tenderness.  LUNGS: Normal breath sounds bilaterally, no wheezing, rales,rhonchi or crepitation. No use of accessory muscles of respiration.  CARDIOVASCULAR: S1, S2 normal. No murmurs, rubs, or gallops.  ABDOMEN: Soft, non-tender, non-distended. Bowel sounds present. No organomegaly or mass.  EXTREMITIES: No pedal edema, cyanosis, or clubbing.  NEUROLOGIC: Cranial nerves II through XII are intact. Muscle strength 5/5 in all extremities. Sensation intact. Gait not checked.  PSYCHIATRIC: The patient is alert and oriented x 3.  SKIN: No obvious  rash, lesion, or ulcer.   DATA REVIEW:   CBC  Recent Labs  Lab 02/13/18 0029  WBC 10.9*  HGB 13.0  HCT 39.7*  PLT 218    Chemistries  Recent Labs  Lab 02/13/18 0029  02/14/18 0437  NA 136  --   --   K 3.6  --   --   CL 104  --   --   CO2 21*  --   --   GLUCOSE 365*  --   --   BUN 26*  --   --   CREATININE 1.39*  --   --   CALCIUM 8.7*  --   --   MG  --    < > 1.9  AST 35  --   --   ALT 16  --   --   ALKPHOS 117  --   --   BILITOT 0.9  --   --    < > = values in this interval not displayed.    Microbiology Results   Recent Results (from the past 240 hour(s))  Urine Culture     Status: Abnormal (Preliminary result)   Collection Time: 02/13/18  4:02 PM  Result Value Ref Range Status   Specimen Description   Final    URINE, RANDOM Performed at [facility], [address] [place], [place] [address]    Special Requests   Final    NONE Performed at [facility], [address] [place], [place] [address]    Culture (A)  Final    >=100,000 COLONIES/mL ESCHERICHIA COLI SUSCEPTIBILITIES TO FOLLOW Performed at [facility], [address], [place], [place] [address]    Report Status PENDING  Incomplete    RADIOLOGY:  Dg Chest Portable 1 View  Result Date: 02/13/2018 CLINICAL DATA:  72 year old male with tachycardia. EXAM: PORTABLE CHEST 1 VIEW COMPARISON:  Chest radiograph dated 06/07/2017 FINDINGS: Stable cardiomegaly. No focal consolidation, pleural effusion or pneumothorax. There is median sternotomy wires and right pectoral AICD device. No acute osseous pathology. IMPRESSION: 1. No acute cardiopulmonary process. 2. Cardiomegaly. Electronically Signed   By: Elgie Collard M.D.   On: 02/13/2018 01:17     Management plans discussed with the patient, family and they are in agreement.  CODE STATUS:     Code Status Orders  (From admission, onward)        Start     Ordered  02/13/18 0330  Full code  Continuous     02/13/18  0329    Code Status History    Date Active Date Inactive Code Status Order ID Comments User Context   05/25/2017 1044 05/25/2017 1428 Full Code [id number]  Annice Needy, MD Inpatient   04/26/2017 1417 04/26/2017 1910 Full Code [id number]  Annice Needy, MD Inpatient    Advance Directive Documentation     Most Recent Value  Type of Advance Directive  Living will  Pre-existing out of facility DNR order (yellow form or pink MOST form)  -  "MOST" Form in Place?  -      TOTAL TIME TAKING CARE OF THIS PATIENT: *40* minutes.    Enedina Finner M.D on 02/14/2018 at 4:59 PM  Between 7am to 6pm - Pager - [phone] After 6pm go to [email or web address] - Social research officer, government  [facility]  Office  [phone]  CC: Primary care physician; Lauro Regulus, MD

## 2018-02-14 NOTE — Progress Notes (Signed)
SUBJECTIVE: Feeling well. No chest pain.   Vitals:   02/13/18 1933 02/14/18 0406 02/14/18 0407 02/14/18 0800  BP: 116/62  126/70 119/70  Pulse: (!) 117  70 62  Resp: 18  18 14   Temp: 98 F (36.7 C)  98.5 F (36.9 C) 97.7 F (36.5 C)  TempSrc: Oral  Oral Oral  SpO2: 97%  93% 96%  Weight:  130 lb 4.8 oz (59.1 kg)    Height:        Intake/Output Summary (Last 24 hours) at 02/14/2018 0848 Last data filed at 02/14/2018 0820 Gross per 24 hour  Intake 133.6 ml  Output 2080 ml  Net -1946.4 ml    LABS: Basic Metabolic Panel: Recent Labs    02/13/18 0029 02/13/18 0505 02/14/18 0437  NA 136  --   --   K 3.6  --   --   CL 104  --   --   CO2 21*  --   --   GLUCOSE 365*  --   --   BUN 26*  --   --   CREATININE 1.39*  --   --   CALCIUM 8.7*  --   --   MG  --  1.6* 1.9   Liver Function Tests: Recent Labs    02/13/18 0029  AST 35  ALT 16  ALKPHOS 117  BILITOT 0.9  PROT 7.2  ALBUMIN 3.7   No results for input(s): LIPASE, AMYLASE in the last 72 hours. CBC: Recent Labs    02/13/18 0029  WBC 10.9*  HGB 13.0  HCT 39.7*  MCV 85.0  PLT 218   Cardiac Enzymes: Recent Labs    02/13/18 0029  TROPONINI 0.04*   BNP: Invalid input(s): POCBNP D-Dimer: No results for input(s): DDIMER in the last 72 hours. Hemoglobin A1C: Recent Labs    02/13/18 0511  HGBA1C 12.4*   Fasting Lipid Panel: No results for input(s): CHOL, HDL, LDLCALC, TRIG, CHOLHDL, LDLDIRECT in the last 72 hours. Thyroid Function Tests: Recent Labs    02/13/18 0511  TSH 2.612   Anemia Panel: No results for input(s): VITAMINB12, FOLATE, FERRITIN, TIBC, IRON, RETICCTPCT in the last 72 hours.   PHYSICAL EXAM General: Well developed, well nourished, in no acute distress HEENT:  Normocephalic and atramatic Neck:  No JVD.  Lungs: Clear bilaterally to auscultation and percussion. Heart: HRRR . Normal S1 and S2 without gallops or murmurs.  Abdomen: Bowel sounds are positive, abdomen soft and  non-tender  Msk:  Back normal, normal gait. Normal strength and tone for age. Extremities: No clubbing, cyanosis or edema.   Neuro: Alert and oriented X 3. Psych:  Good affect, responds appropriately  TELEMETRY: NSR 67bpm  ASSESSMENT AND PLAN:Ventricular tachycardia:ICD interrogated and settings adjusted.  Advise amiodarone 400mg  by mouth and discharge with outpatient follow up Friday 02/16/18 at 10am.   Active Problems:   Ventricular tachycardia (HCC)   Protein-calorie malnutrition, severe    Caroleen HammanKristin Veronda Gabor, NP-C 02/14/2018 8:48 AM Cell: [phone]

## 2018-02-15 LAB — URINE CULTURE

## 2018-02-28 ENCOUNTER — Emergency Department: Payer: Medicare HMO

## 2018-02-28 ENCOUNTER — Inpatient Hospital Stay
Admission: EM | Admit: 2018-02-28 | Discharge: 2018-03-02 | DRG: 193 | Disposition: A | Discharge status: Home / Self Care | Payer: Medicare HMO | Attending: Internal Medicine | Admitting: Internal Medicine

## 2018-02-28 ENCOUNTER — Encounter: Payer: Self-pay | Admitting: *Deleted

## 2018-02-28 ENCOUNTER — Other Ambulatory Visit: Payer: Self-pay

## 2018-02-28 DIAGNOSIS — Z794 Long term (current) use of insulin: Secondary | ICD-10-CM

## 2018-02-28 DIAGNOSIS — E1122 Type 2 diabetes mellitus with diabetic chronic kidney disease: Secondary | ICD-10-CM | POA: Diagnosis present

## 2018-02-28 DIAGNOSIS — Y95 Nosocomial condition: Secondary | ICD-10-CM | POA: Diagnosis present

## 2018-02-28 DIAGNOSIS — R739 Hyperglycemia, unspecified: Secondary | ICD-10-CM

## 2018-02-28 DIAGNOSIS — F17200 Nicotine dependence, unspecified, uncomplicated: Secondary | ICD-10-CM | POA: Diagnosis present

## 2018-02-28 DIAGNOSIS — Z7982 Long term (current) use of aspirin: Secondary | ICD-10-CM

## 2018-02-28 DIAGNOSIS — J189 Pneumonia, unspecified organism: Principal | ICD-10-CM | POA: Diagnosis present

## 2018-02-28 DIAGNOSIS — Z79899 Other long term (current) drug therapy: Secondary | ICD-10-CM

## 2018-02-28 DIAGNOSIS — E860 Dehydration: Secondary | ICD-10-CM | POA: Diagnosis present

## 2018-02-28 DIAGNOSIS — I252 Old myocardial infarction: Secondary | ICD-10-CM | POA: Diagnosis not present

## 2018-02-28 DIAGNOSIS — T50906A Underdosing of unspecified drugs, medicaments and biological substances, initial encounter: Secondary | ICD-10-CM | POA: Diagnosis present

## 2018-02-28 DIAGNOSIS — Z955 Presence of coronary angioplasty implant and graft: Secondary | ICD-10-CM

## 2018-02-28 DIAGNOSIS — I5022 Chronic systolic (congestive) heart failure: Secondary | ICD-10-CM | POA: Diagnosis present

## 2018-02-28 DIAGNOSIS — Z6820 Body mass index (BMI) 20.0-20.9, adult: Secondary | ICD-10-CM

## 2018-02-28 DIAGNOSIS — N183 Chronic kidney disease, stage 3 (moderate): Secondary | ICD-10-CM | POA: Diagnosis present

## 2018-02-28 DIAGNOSIS — I4891 Unspecified atrial fibrillation: Secondary | ICD-10-CM | POA: Diagnosis present

## 2018-02-28 DIAGNOSIS — I130 Hypertensive heart and chronic kidney disease with heart failure and stage 1 through stage 4 chronic kidney disease, or unspecified chronic kidney disease: Secondary | ICD-10-CM | POA: Diagnosis present

## 2018-02-28 DIAGNOSIS — R079 Chest pain, unspecified: Secondary | ICD-10-CM

## 2018-02-28 DIAGNOSIS — Z888 Allergy status to other drugs, medicaments and biological substances status: Secondary | ICD-10-CM | POA: Diagnosis not present

## 2018-02-28 DIAGNOSIS — Z95810 Presence of automatic (implantable) cardiac defibrillator: Secondary | ICD-10-CM

## 2018-02-28 DIAGNOSIS — K219 Gastro-esophageal reflux disease without esophagitis: Secondary | ICD-10-CM | POA: Diagnosis present

## 2018-02-28 DIAGNOSIS — Z8679 Personal history of other diseases of the circulatory system: Secondary | ICD-10-CM | POA: Diagnosis not present

## 2018-02-28 DIAGNOSIS — Z951 Presence of aortocoronary bypass graft: Secondary | ICD-10-CM | POA: Diagnosis not present

## 2018-02-28 DIAGNOSIS — E1165 Type 2 diabetes mellitus with hyperglycemia: Secondary | ICD-10-CM | POA: Diagnosis present

## 2018-02-28 DIAGNOSIS — J440 Chronic obstructive pulmonary disease with acute lower respiratory infection: Secondary | ICD-10-CM | POA: Diagnosis present

## 2018-02-28 DIAGNOSIS — E43 Unspecified severe protein-calorie malnutrition: Secondary | ICD-10-CM | POA: Diagnosis present

## 2018-02-28 DIAGNOSIS — Z8619 Personal history of other infectious and parasitic diseases: Secondary | ICD-10-CM | POA: Diagnosis not present

## 2018-02-28 DIAGNOSIS — R000 Tachycardia, unspecified: Secondary | ICD-10-CM | POA: Diagnosis present

## 2018-02-28 DIAGNOSIS — I25709 Atherosclerosis of coronary artery bypass graft(s), unspecified, with unspecified angina pectoris: Secondary | ICD-10-CM | POA: Diagnosis present

## 2018-02-28 DIAGNOSIS — Z91048 Other nonmedicinal substance allergy status: Secondary | ICD-10-CM

## 2018-02-28 LAB — GLUCOSE, CAPILLARY: Glucose-Capillary: 280 mg/dL — ABNORMAL HIGH (ref 70–99)

## 2018-02-28 LAB — TROPONIN I
Troponin I: 0.04 ng/mL (ref <0.03)
Troponin I: 0.03 ng/mL (ref <0.03)

## 2018-02-28 LAB — URINALYSIS, COMPLETE (UACMP) WITH MICROSCOPIC
Bacteria, UA: NONE SEEN
Bilirubin Urine: NEGATIVE
Hgb urine dipstick: NEGATIVE
Ketones, ur: NEGATIVE mg/dL
LEUKOCYTES UA: NEGATIVE
Nitrite: NEGATIVE
PH: 6 (ref 5.0–8.0)
Protein, ur: NEGATIVE mg/dL
SPECIFIC GRAVITY, URINE: 1.015 (ref 1.005–1.030)
SQUAMOUS EPITHELIAL / LPF: NONE SEEN (ref 0–5)

## 2018-02-28 LAB — BASIC METABOLIC PANEL
Anion gap: 8 (ref 5–15)
BUN: 22 mg/dL (ref 8–23)
CO2: 21 mmol/L — ABNORMAL LOW (ref 22–32)
Calcium: 8.9 mg/dL (ref 8.9–10.3)
Chloride: 103 mmol/L (ref 98–111)
Creatinine, Ser: 1.35 mg/dL — ABNORMAL HIGH (ref 0.61–1.24)
GFR calc Af Amer: 59 mL/min — ABNORMAL LOW (ref >60)
GFR, EST NON AFRICAN AMERICAN: 50 mL/min — AB (ref >60)
Glucose, Bld: 496 mg/dL — ABNORMAL HIGH (ref 70–99)
Potassium: 4.5 mmol/L (ref 3.5–5.1)
SODIUM: 132 mmol/L — AB (ref 135–145)

## 2018-02-28 LAB — CBC
HEMATOCRIT: 37.4 % — AB (ref 40.0–52.0)
HEMOGLOBIN: 12.4 g/dL — AB (ref 13.0–18.0)
MCH: 27.9 pg (ref 26.0–34.0)
MCHC: 33.1 g/dL (ref 32.0–36.0)
MCV: 84.2 fL (ref 80.0–100.0)
Platelets: 181 10*3/uL (ref 150–440)
RBC: 4.44 MIL/uL (ref 4.40–5.90)
RDW: 16.2 % — ABNORMAL HIGH (ref 11.5–14.5)
WBC: 10 10*3/uL (ref 3.8–10.6)

## 2018-02-28 LAB — DIGOXIN LEVEL: DIGOXIN LVL: 1.6 ng/mL (ref 0.8–2.0)

## 2018-02-28 MED ORDER — SODIUM CHLORIDE 0.9 % IV SOLN
Freq: Once | INTRAVENOUS | Status: AC
  Administered 2018-02-28: 19:00:00 via INTRAVENOUS

## 2018-02-28 MED ORDER — SODIUM CHLORIDE 0.9 % IV SOLN
500.0000 mg | Freq: Once | INTRAVENOUS | Status: AC
  Administered 2018-02-28: 500 mg via INTRAVENOUS
  Filled 2018-02-28: qty 500

## 2018-02-28 MED ORDER — SODIUM CHLORIDE 0.9 % IV SOLN
1.0000 g | Freq: Once | INTRAVENOUS | Status: AC
  Administered 2018-02-28: 1 g via INTRAVENOUS
  Filled 2018-02-28: qty 10

## 2018-02-28 NOTE — ED Notes (Signed)
Pt reports high blood sugar today and took 15 units of lantus at 1730 today.  Pt also reports left arm pain and chest pain.  No sob. No n/v/d.  No dizziness.  Pt took 2 ntg at 1600 today for left arm pain with some relief.  Pt alert   Speech clear.

## 2018-02-28 NOTE — H&P (Signed)
[facility] - [place] at [facility]   PATIENT NAME: [name]    MR#:  [id number]  DATE OF BIRTH:  22-Jun-1946  DATE OF ADMISSION:  02/28/2018  PRIMARY CARE PHYSICIAN: Lauro Regulus, MD   REQUESTING/REFERRING PHYSICIAN:   CHIEF COMPLAINT:   Chief Complaint  Patient presents with  . Hyperglycemia  . Chest Pain    HISTORY OF PRESENT ILLNESS: [name]  is a 72 y.o. male with a known history of diabetes type 2, V. tach, status post AICD placement, CHF, CKD, COPD, hypertension. Patient presented to emergency room for elevated blood sugar at home in 500s noticed earlier today.  He also reports chills, productive cough and heart racing going on for the past 24 hours.  Patient admits to smoking more cigarettes lately in the past few weeks.  It is unclear whether he is compliant with his medications.   Blood test done emergency room are notable for creatinine level of 1.35, sodium level 132.  Glucose level is 496.  Anion gap is within normal limits. Patient was recently admitted for V. tach episodes.  His AICD was evaluated and readjusted. Chest x-ray, reviewed by myself is remarkable for right lower lobe infiltrate. EKG shows ventricular paced rhythm with rate of 120; no acute ST-T changes. Patient is admitted for further evaluation and treatment.  PAST MEDICAL HISTORY:   Past Medical History:  Diagnosis Date  . AICD (automatic cardioverter/defibrillator) present 2010   battery changed out 2015  . Anginal pain (HCC)    infrequent pain. usually feels as pain in the arms  . Bilateral swelling of feet   . CHF (congestive heart failure) (HCC)   . Chronic cough   . Chronic kidney disease    renal insufficiency.started on spironalactone  . COPD (chronic obstructive pulmonary disease) (HCC)    does not use o2  . Coronary artery disease   . Diabetes mellitus without complication (HCC)   . Dysrhythmia   . GERD (gastroesophageal reflux disease)   .  Hepatitis   . Hypercholesteremia   . Hypertension    history of,   . Myocardial infarction (HCC) 1993   several. per patient, has had 10 heart attacks  . Pneumonia   . Presence of permanent cardiac pacemaker    and defibrilator    PAST SURGICAL HISTORY:  Past Surgical History:  Procedure Laterality Date  . CARDIAC CATHETERIZATION     with stents  . CARDIAC DEFIBRILLATOR PLACEMENT  2010  . CATARACT EXTRACTION W/PHACO Left 02/23/2016   Procedure: CATARACT EXTRACTION PHACO AND INTRAOCULAR LENS PLACEMENT (IOC);  Surgeon: Galen Manila, MD;  Location: [facility] ORS;  Service: Ophthalmology;  Laterality: Left;  [place] 1.05 AP% 22.9 CDE 15.02 Fluid pack lot # [id number]  . CHOLECYSTECTOMY  2014  . CORONARY ARTERY BYPASS GRAFT    . feeding tube removal  2014  . GASTROSTOMY W/ FEEDING TUBE  2014   after galbladder surgery.  . LOWER EXTREMITY ANGIOGRAPHY Left 04/26/2017   Procedure: Lower Extremity Angiography;  Surgeon: Annice Needy, MD;  Location: [facility] INVASIVE CV LAB;  Service: Cardiovascular;  Laterality: Left;  . LOWER EXTREMITY ANGIOGRAPHY Right 05/25/2017   Procedure: Lower Extremity Angiography;  Surgeon: Annice Needy, MD;  Location: [facility] INVASIVE CV LAB;  Service: Cardiovascular;  Laterality: Right;  . PANCREATIC CYST EXCISION  2014   had hepatitis at this time  . STENT PLACEMENT NON-VASCULAR ([facility] HX)     cardiac stents x many    SOCIAL HISTORY:  Social History   Tobacco Use  . Smoking status: Current Every Day Smoker    Packs/day: 1.00    Years: 55.00    Pack years: 55.00  . Smokeless tobacco: Never Used  Substance Use Topics  . Alcohol use: No    Comment: rare    FAMILY HISTORY: No family history on file.  DRUG ALLERGIES:  Allergies  Allergen Reactions  . Other     Intolerant to blood thinners - pt states that he has had "internal bleeding" while on blood thinners  . Adhesive [Tape] Other (See Comments)    "PULLS SKIN OFF" PER PT REPORT. OK TO USE PAPER TAPE.     REVIEW OF SYSTEMS:   CONSTITUTIONAL: Positive for chills, no fever.  Positive for fatigue and generalized weakness.  EYES: No vision changes.  EARS, NOSE, AND THROAT: No tinnitus or ear pain.  RESPIRATORY: Positive for productive cough, no shortness of breath, wheezing or hemoptysis.  CARDIOVASCULAR: No chest pain, orthopnea, edema.  GASTROINTESTINAL: No nausea, vomiting, diarrhea or abdominal pain.  GENITOURINARY: No dysuria, hematuria.  ENDOCRINE: No polyuria, nocturia,  HEMATOLOGY: No anemia, easy bruising or bleeding SKIN: No rash or lesion. MUSCULOSKELETAL: No joint pain.   NEUROLOGIC: No focal weakness.  PSYCHIATRY: No anxiety or depression.   MEDICATIONS AT HOME:  Prior to Admission medications   Medication Sig Start Date End Date Taking? Authorizing Provider  amiodarone (PACERONE) 400 MG tablet Take 1 tablet (400 mg total) by mouth daily. 02/15/18  Yes Enedina Finner, MD  aspirin EC 81 MG tablet Take 81 mg by mouth daily.   Yes [provider]  digoxin (LANOXIN) 0.125 MG tablet Take 0.125 mg by mouth daily.   Yes [provider]  furosemide (LASIX) 20 MG tablet Take 20 mg by mouth daily as needed for fluid.    Yes [provider]  insulin NPH Human (HUMULIN N,NOVOLIN N) 100 UNIT/ML injection Inject 0.18 mLs (18 Units total) into the skin 2 (two) times daily before a meal. Patient taking differently: Inject 15 Units into the skin 2 (two) times daily before a meal.  02/14/18  Yes Enedina Finner, MD  Melatonin 5 MG TABS Take 10 mg by mouth at bedtime.    Yes [provider]  metoprolol succinate (TOPROL-XL) 50 MG 24 hr tablet Take 50 mg by mouth 2 (two) times daily.   Yes [provider]  Multiple Vitamin (MULTIVITAMIN) capsule Take 1 capsule by mouth daily.   Yes [provider]  nitroGLYCERIN (NITRODUR - DOSED IN MG/24 HR) 0.1 mg/hr patch Place 1 patch (0.1 mg total) onto the skin daily as needed. Patient taking differently: Place  0.1 mg onto the skin daily as needed (chest pain).  02/14/18  Yes Enedina Finner, MD  nitroGLYCERIN (NITROSTAT) 0.4 MG SL tablet Place 0.4 mg under the tongue every 5 (five) minutes as needed for chest pain.   Yes [provider]  omeprazole (PRILOSEC) 40 MG capsule Take 40 mg by mouth daily.   Yes [provider]  pravastatin (PRAVACHOL) 40 MG tablet Take 40 mg by mouth at bedtime.   Yes [provider]  spironolactone (ALDACTONE) 25 MG tablet Take 25 mg by mouth every morning before breakfast. 05/17/16  Yes [provider]  triamcinolone cream (KENALOG) 0.5 % Apply 1 application topically 2 (two) times daily. 01/17/18  Yes [provider]  vitamin B-12 (CYANOCOBALAMIN) 1000 MCG tablet Take 1,000 mcg by mouth 2 (two) times daily.   Yes [provider]      PHYSICAL EXAMINATION:   VITAL SIGNS: Blood pressure 115/80, pulse (!) 31, temperature 98.3 F (36.8 C), temperature source Oral, resp. rate (!) 25, height 5\' 5"  (1.651 m), weight 59 kg (130 lb), SpO2 98 %.  GENERAL:  72 y.o.-year-old patient lying in the bed with no acute distress.  EYES: Pupils equal, round, reactive to light and accommodation. No scleral icterus. Extraocular muscles intact.  HEENT: Head atraumatic, normocephalic. Oropharynx and nasopharynx clear.  NECK:  Supple, no jugular venous distention. No thyroid enlargement, no tenderness.  LUNGS: Reduced breath sounds bilaterally, no wheezing. No use of accessory muscles of respiration.  CARDIOVASCULAR: S1, S2 normal. No S3/S4.  ABDOMEN: Soft, nontender, nondistended. Bowel sounds present. No organomegaly or mass.  EXTREMITIES: No pedal edema, cyanosis, or clubbing.  NEUROLOGIC: No focal weakness. PSYCHIATRIC: The patient is alert and oriented x 3.  SKIN: No obvious rash, lesion, or ulcer.   LABORATORY PANEL:   CBC Recent Labs  Lab 02/28/18 1827  WBC 10.0  HGB 12.4*  HCT 37.4*  PLT 181  MCV 84.2  MCH 27.9  MCHC 33.1   RDW 16.2*   ------------------------------------------------------------------------------------------------------------------  Chemistries  Recent Labs  Lab 02/28/18 1827  NA 132*  K 4.5  CL 103  CO2 21*  GLUCOSE 496*  BUN 22  CREATININE 1.35*  CALCIUM 8.9   ------------------------------------------------------------------------------------------------------------------ estimated creatinine clearance is 40.7 mL/min (A) (by C-G formula based on SCr of 1.35 mg/dL (H)). ------------------------------------------------------------------------------------------------------------------ No results for input(s): TSH, T4TOTAL, T3FREE, THYROIDAB in the last 72 hours.  Invalid input(s): FREET3   Coagulation profile No results for input(s): INR, PROTIME in the last 168 hours. ------------------------------------------------------------------------------------------------------------------- No results for input(s): DDIMER in the last 72 hours. -------------------------------------------------------------------------------------------------------------------  Cardiac Enzymes Recent Labs  Lab 02/28/18 1827 02/28/18 2121  TROPONINI 0.04* 0.03*   ------------------------------------------------------------------------------------------------------------------ Invalid input(s): POCBNP  ---------------------------------------------------------------------------------------------------------------  Urinalysis    Component Value Date/Time   COLORURINE STRAW (A) 02/28/2018 1826   APPEARANCEUR CLEAR (A) 02/28/2018 1826   APPEARANCEUR Clear 06/04/2013 1455   LABSPEC 1.015 02/28/2018 1826   LABSPEC 1.008 06/04/2013 1455   PHURINE 6.0 02/28/2018 1826   GLUCOSEU >=500 (A) 02/28/2018 1826   GLUCOSEU Negative 06/04/2013 1455   HGBUR NEGATIVE 02/28/2018 1826   BILIRUBINUR NEGATIVE 02/28/2018 1826   BILIRUBINUR Negative 06/04/2013 1455   KETONESUR NEGATIVE 02/28/2018 1826   PROTEINUR  NEGATIVE 02/28/2018 1826   NITRITE NEGATIVE 02/28/2018 1826   LEUKOCYTESUR NEGATIVE 02/28/2018 1826   LEUKOCYTESUR 2+ 06/04/2013 1455     RADIOLOGY: Dg Chest 2 View  Result Date: 02/28/2018 CLINICAL DATA:  Hyperglycemia, tachycardia, left arm pain EXAM: CHEST - 2 VIEW COMPARISON:  02/13/2018 FINDINGS: Mild right lower lobe opacity, atelectasis versus pneumonia. Left lung is essentially clear. No pleural effusion or pneumothorax. The heart is normal in size. Postsurgical changes related to prior CABG. Right subclavian ICD. Degenerative changes of the visualized thoracolumbar spine. IMPRESSION: Mild right lower lobe opacity, atelectasis versus pneumonia. Electronically Signed   By: Charline BillsSriyesh  Krishnan M.D.   On: 02/28/2018 19:02    EKG: Orders placed or performed during the hospital encounter of 02/28/18  . ED EKG within 10 minutes  . ED EKG within 10 minutes  . EKG 12-Lead  . EKG 12-Lead    IMPRESSION AND PLAN:  1. HCAP, will start IV antibiotics, nebulizer treatments and oxygen therapy. 2.  Tachycardia likely related to infectious process, and dehydration.  We will start treatment with IV antibiotics and IV fluids and continue to  monitor patient on telemetry.  Continue home medications, including amiodarone, digoxin and metoprolol. 3.  COPD, without acute exacerbation.  Continue maintenance therapy. 4.  CKD 3, stable, creatinine at baseline.  Continue to monitor kidney function closely and avoid nephrotoxic medications. 5.  Uncontrolled diabetes type 2, with hyperglycemia likely due to poor compliance with, medications.  Will start insulin treatment and monitor blood sugars before meals at bedtime.  Continue low-carb diet. 6.  CHF, currently clinically compensated.  Continue medical treatment.  All the records are reviewed and case discussed with ED provider. Management plans discussed with the patient, family and they are in agreement.  CODE STATUS: Full Code Status History    Date  Active Date Inactive Code Status Order ID Comments User Context   02/13/2018 0329 02/14/2018 2152 Full Code [id number]  Arnaldo Natal, MD Inpatient   05/25/2017 1044 05/25/2017 1428 Full Code [id number]  Annice Needy, MD Inpatient   04/26/2017 1417 04/26/2017 1910 Full Code [id number]  Annice Needy, MD Inpatient       TOTAL TIME TAKING CARE OF THIS PATIENT:  45 minutes.    Cammy Copa M.D on 02/28/2018 at 11:59 PM  Between 7am to 6pm - Pager - [phone]  After 6pm go to [email or web address] - password [email or web address] [facility]  [facility]  Office  [phone]  CC: Primary care physician; Lauro Regulus, MD

## 2018-02-28 NOTE — ED Notes (Signed)
Report off to vanessa rn 

## 2018-02-28 NOTE — ED Notes (Signed)
ED Provider at bedside. 

## 2018-02-28 NOTE — ED Provider Notes (Signed)
[facility] Emergency Department Provider Note   ____________________________________________   I have reviewed the triage vital signs and the nursing notes.   HISTORY  Chief Complaint Hyperglycemia and Chest Pain   History limited by: Not Limited   HPI [name] is a 72 y.o. male who presents to the emergency department today because of concerns for elevated blood sugar.  Patient states that he noticed he was starting to shake today.  His wife checked his sugars and they were over 500.  Normally his blood sugars 200 and below.  In addition when his shaking occurred he also noticed that his heart was racing.  Did have a recent admission for heart related issues.  Denies any infectious symptoms.  Denies any fevers, chest pain shortness of breath nausea vomiting or abdominal pain.   Per medical record review patient has a history of recent admission for vtach.  Past Medical History:  Diagnosis Date  . AICD (automatic cardioverter/defibrillator) present 2010   battery changed out 2015  . Anginal pain (HCC)    infrequent pain. usually feels as pain in the arms  . Bilateral swelling of feet   . CHF (congestive heart failure) (HCC)   . Chronic cough   . Chronic kidney disease    renal insufficiency.started on spironalactone  . COPD (chronic obstructive pulmonary disease) (HCC)    does not use o2  . Coronary artery disease   . Diabetes mellitus without complication (HCC)   . Dysrhythmia   . GERD (gastroesophageal reflux disease)   . Hepatitis   . Hypercholesteremia   . Hypertension    history of,   . Myocardial infarction (HCC) 1993   several. per patient, has had 10 heart attacks  . Pneumonia   . Presence of permanent cardiac pacemaker    and defibrilator    Patient Active Problem List   Diagnosis Date Noted  . Ventricular tachycardia (HCC) 02/13/2018  . Protein-calorie malnutrition, severe 02/13/2018  . Hyperlipidemia 05/17/2017  .  Hypertension 04/21/2017  . Diabetes mellitus without complication (HCC) 04/21/2017  . Tobacco use disorder 04/21/2017  . Atherosclerosis of native arteries of extremity with intermittent claudication (HCC) 04/21/2017    Past Surgical History:  Procedure Laterality Date  . CARDIAC CATHETERIZATION     with stents  . CARDIAC DEFIBRILLATOR PLACEMENT  2010  . CATARACT EXTRACTION W/PHACO Left 02/23/2016   Procedure: CATARACT EXTRACTION PHACO AND INTRAOCULAR LENS PLACEMENT (IOC);  Surgeon: Galen Manila, MD;  Location: [facility] ORS;  Service: Ophthalmology;  Laterality: Left;  [place] 1.05 AP% 22.9 CDE 15.02 Fluid pack lot # [id number]  . CHOLECYSTECTOMY  2014  . CORONARY ARTERY BYPASS GRAFT    . feeding tube removal  2014  . GASTROSTOMY W/ FEEDING TUBE  2014   after galbladder surgery.  . LOWER EXTREMITY ANGIOGRAPHY Left 04/26/2017   Procedure: Lower Extremity Angiography;  Surgeon: Annice Needy, MD;  Location: [facility] INVASIVE CV LAB;  Service: Cardiovascular;  Laterality: Left;  . LOWER EXTREMITY ANGIOGRAPHY Right 05/25/2017   Procedure: Lower Extremity Angiography;  Surgeon: Annice Needy, MD;  Location: [facility] INVASIVE CV LAB;  Service: Cardiovascular;  Laterality: Right;  . PANCREATIC CYST EXCISION  2014   had hepatitis at this time  . STENT PLACEMENT NON-VASCULAR ([facility] HX)     cardiac stents x many    Prior to Admission medications   Medication Sig Start Date End Date Taking? Authorizing Provider  amiodarone (PACERONE) 400 MG tablet Take 1 tablet (400 mg total)  by mouth daily. 02/15/18   Enedina Finner, MD  aspirin EC 81 MG tablet Take 81 mg by mouth daily.    [provider]  digoxin (LANOXIN) 0.125 MG tablet Take 0.125 mg by mouth daily.    [provider]  furosemide (LASIX) 20 MG tablet Take 20 mg by mouth daily as needed for fluid.     [provider]  insulin NPH Human (HUMULIN N,NOVOLIN N) 100 UNIT/ML injection Inject 0.18 mLs (18 Units total) into the skin 2 (two)  times daily before a meal. 02/14/18   Enedina Finner, MD  Melatonin 5 MG TABS Take 10 mg by mouth at bedtime.     [provider]  metoprolol succinate (TOPROL-XL) 50 MG 24 hr tablet Take 50 mg by mouth 2 (two) times daily.    [provider]  Multiple Vitamin (MULTIVITAMIN) capsule Take 1 capsule by mouth daily.    [provider]  nitroGLYCERIN (NITRODUR - DOSED IN MG/24 HR) 0.1 mg/hr patch Place 1 patch (0.1 mg total) onto the skin daily as needed. 02/14/18   Enedina Finner, MD  nitroGLYCERIN (NITROSTAT) 0.4 MG SL tablet Place 0.4 mg under the tongue every 5 (five) minutes as needed for chest pain.    [provider]  omeprazole (PRILOSEC) 40 MG capsule Take 40 mg by mouth daily.    [provider]  pravastatin (PRAVACHOL) 40 MG tablet Take 40 mg by mouth at bedtime.    [provider]  spironolactone (ALDACTONE) 25 MG tablet Take 25 mg by mouth every morning before breakfast. 05/17/16   [provider]  triamcinolone cream (KENALOG) 0.5 % Apply 1 application topically 2 (two) times daily. 01/17/18   [provider]    Allergies Other and Adhesive [tape]  No family history on file.  Social History Social History   Tobacco Use  . Smoking status: Current Every Day Smoker    Packs/day: 1.00    Years: 55.00    Pack years: 55.00  . Smokeless tobacco: Never Used  Substance Use Topics  . Alcohol use: No    Comment: rare  . Drug use: No    Review of Systems Constitutional: No fever/chills Eyes: No visual changes. ENT: No sore throat. Cardiovascular: Positive for fast heart rate. Respiratory: Denies shortness of breath. Gastrointestinal: No abdominal pain.  No nausea, no vomiting.  No diarrhea.   Genitourinary: Negative for dysuria. Musculoskeletal: Negative for back pain. Skin: Negative for rash. Neurological: Negative for headaches, focal weakness or numbness.  ____________________________________________   PHYSICAL  EXAM:  VITAL SIGNS: ED Triage Vitals [02/28/18 1821]  Enc Vitals Group     BP 122/79     Pulse Rate (!) 46     Resp 18     Temp 98.3 F (36.8 C)     Temp Source Oral     SpO2 95 %   Constitutional: Alert and oriented.  Eyes: Conjunctivae are normal.  ENT      Head: Normocephalic and atraumatic.      Nose: No congestion/rhinnorhea.      Mouth/Throat: Mucous membranes are moist.      Neck: No stridor. Hematological/Lymphatic/Immunilogical: No cervical lymphadenopathy. Cardiovascular: Tachycardic, irregularly irregular rhythm.  No murmurs, rubs, or gallops.  Respiratory: Normal respiratory effort without tachypnea nor retractions. Breath sounds are clear and equal bilaterally. No wheezes/rales/rhonchi. Gastrointestinal: Soft and non tender. No rebound. No guarding.  Genitourinary: Deferred Musculoskeletal: Normal range of motion in all extremities. No lower extremity edema. Neurologic:  Normal speech and language. No gross focal neurologic deficits are appreciated.  Skin:  Skin is warm, dry and intact. No rash noted. Psychiatric: Mood and affect are normal. Speech and behavior are normal. Patient exhibits appropriate insight and judgment.  ____________________________________________    LABS (pertinent positives/negatives)  Trop 0.03 CBC wbc 10, hgb 12.4, plt 181 BMP na 132, glu 496, cr 1.35 Dig 1.6 ua not consistent with infection  ____________________________________________   EKG  I, Phineas SemenGraydon Safiyya Stokes, attending physician, personally viewed and interpreted this EKG  EKG Time: 1830 Rate: 121 Rhythm: ventricular paced rhythm Axis: rightward axis Intervals: qtc 571 QRS: ventricular paced ST changes: no st elevation Impression: abnromal tk   ____________________________________________    RADIOLOGY  CXR Concern for possible pneumonia vs  atelecatasis  ____________________________________________   PROCEDURES  Procedures  ____________________________________________   INITIAL IMPRESSION / ASSESSMENT AND PLAN / ED COURSE  Pertinent labs & imaging results that were available during my care of the patient were reviewed by me and considered in my medical decision making (see chart for details).   Patient presented to the emergency department today because of concerns for fast heart rate and high blood sugar.  Work-up did show elevated glucose however no evidence of DKA.  Patient's x-ray showed possible pneumonia.  Initially was not sure if this was pneumonia or atelectasis.  Patient did not have a white count.  Patient was given IV fluids and is sugar levels did improve however continued to be tachycardic.  Given continued tachycardia I do have concern for infection.  Will start antibiotics.  Will plan on admission.  Discussed findings plan with patient.  ____________________________________________   FINAL CLINICAL IMPRESSION(S) / ED DIAGNOSES  Final diagnoses:  Hyperglycemia  Chest pain, unspecified type     Note: This dictation was prepared with Dragon dictation. Any transcriptional errors that result from this process are unintentional     Phineas SemenGoodman, Kaytlynn Kochan, MD 02/28/18 2322

## 2018-02-28 NOTE — ED Triage Notes (Signed)
Pt brought in via ems from home with high blood sugar and chest pain. Pt took 15 units lantus at 1730 today.  Pt also took 2 ntg at 1600. Pt alert, speech clear.

## 2018-03-01 ENCOUNTER — Other Ambulatory Visit: Payer: Self-pay

## 2018-03-01 LAB — GLUCOSE, CAPILLARY
GLUCOSE-CAPILLARY: 183 mg/dL — AB (ref 70–99)
Glucose-Capillary: 267 mg/dL — ABNORMAL HIGH (ref 70–99)
Glucose-Capillary: 118 mg/dL — ABNORMAL HIGH (ref 70–99)
Glucose-Capillary: 295 mg/dL — ABNORMAL HIGH (ref 70–99)
Glucose-Capillary: 370 mg/dL — ABNORMAL HIGH (ref 70–99)
Glucose-Capillary: 344 mg/dL — ABNORMAL HIGH (ref 70–99)

## 2018-03-01 LAB — CBC
HCT: 35.6 % — ABNORMAL LOW (ref 40.0–52.0)
HEMOGLOBIN: 11.7 g/dL — AB (ref 13.0–18.0)
MCH: 27.4 pg (ref 26.0–34.0)
MCHC: 32.9 g/dL (ref 32.0–36.0)
MCV: 83.1 fL (ref 80.0–100.0)
Platelets: 174 10*3/uL (ref 150–440)
RBC: 4.29 MIL/uL — ABNORMAL LOW (ref 4.40–5.90)
RDW: 15.7 % — ABNORMAL HIGH (ref 11.5–14.5)
WBC: 11.1 10*3/uL — ABNORMAL HIGH (ref 3.8–10.6)

## 2018-03-01 LAB — BASIC METABOLIC PANEL
Anion gap: 4 — ABNORMAL LOW (ref 5–15)
BUN: 18 mg/dL (ref 8–23)
CALCIUM: 8.7 mg/dL — AB (ref 8.9–10.3)
CO2: 23 mmol/L (ref 22–32)
Chloride: 109 mmol/L (ref 98–111)
Creatinine, Ser: 1.01 mg/dL (ref 0.61–1.24)
GFR calc Af Amer: >60 mL/min (ref >60)
GFR calc non Af Amer: >60 mL/min (ref >60)
GLUCOSE: 112 mg/dL — AB (ref 70–99)
Potassium: 4 mmol/L (ref 3.5–5.1)
Sodium: 136 mmol/L (ref 135–145)

## 2018-03-01 MED ORDER — HEPARIN SODIUM (PORCINE) 5000 UNIT/ML IJ SOLN
5000.0000 [IU] | Freq: Three times a day (TID) | INTRAMUSCULAR | Status: DC
  Administered 2018-03-01 – 2018-03-02 (×4): 5000 [IU] via SUBCUTANEOUS
  Filled 2018-03-01 (×4): qty 1

## 2018-03-01 MED ORDER — INSULIN DETEMIR 100 UNIT/ML ~~LOC~~ SOLN
15.0000 [IU] | Freq: Two times a day (BID) | SUBCUTANEOUS | Status: DC
Start: 2018-03-01 — End: 2018-03-01
  Filled 2018-03-01 (×2): qty 0.15

## 2018-03-01 MED ORDER — VITAMIN B-12 1000 MCG PO TABS
1000.0000 ug | ORAL_TABLET | Freq: Two times a day (BID) | ORAL | Status: DC
  Administered 2018-03-01 – 2018-03-02 (×3): 1000 ug via ORAL
  Filled 2018-03-01 (×4): qty 1

## 2018-03-01 MED ORDER — INSULIN DETEMIR 100 UNIT/ML ~~LOC~~ SOLN
15.0000 [IU] | Freq: Every day | SUBCUTANEOUS | Status: DC
  Administered 2018-03-01: 15 [IU] via SUBCUTANEOUS
  Filled 2018-03-01 (×2): qty 0.15

## 2018-03-01 MED ORDER — TRAZODONE HCL 50 MG PO TABS
25.0000 mg | ORAL_TABLET | Freq: Every evening | ORAL | Status: DC | PRN
  Administered 2018-03-02: 25 mg via ORAL
  Filled 2018-03-01: qty 1

## 2018-03-01 MED ORDER — ADULT MULTIVITAMIN W/MINERALS CH
1.0000 | ORAL_TABLET | Freq: Every day | ORAL | Status: DC
  Administered 2018-03-01 – 2018-03-02 (×2): 1 via ORAL
  Filled 2018-03-01 (×2): qty 1

## 2018-03-01 MED ORDER — MELATONIN 5 MG PO TABS
10.0000 mg | ORAL_TABLET | Freq: Every day | ORAL | Status: DC
Start: 2018-03-01 — End: 2018-03-02
  Administered 2018-03-01: 10 mg via ORAL
  Filled 2018-03-01 (×3): qty 2

## 2018-03-01 MED ORDER — ONDANSETRON HCL 4 MG PO TABS: 4.0000 mg | ORAL_TABLET | Freq: Four times a day (QID) | ORAL | Status: DC | PRN

## 2018-03-01 MED ORDER — SPIRONOLACTONE 25 MG PO TABS
25.0000 mg | ORAL_TABLET | Freq: Every day | ORAL | Status: DC
  Administered 2018-03-01: 25 mg via ORAL
  Filled 2018-03-01: qty 1

## 2018-03-01 MED ORDER — AMIODARONE HCL 200 MG PO TABS
400.0000 mg | ORAL_TABLET | Freq: Every day | ORAL | Status: DC
  Administered 2018-03-01 – 2018-03-02 (×2): 400 mg via ORAL
  Filled 2018-03-01 (×2): qty 2

## 2018-03-01 MED ORDER — METOPROLOL SUCCINATE ER 50 MG PO TB24
50.0000 mg | ORAL_TABLET | Freq: Two times a day (BID) | ORAL | Status: DC
Start: 2018-03-01 — End: 2018-03-02
  Administered 2018-03-01 (×3): 50 mg via ORAL
  Filled 2018-03-01 (×3): qty 1

## 2018-03-01 MED ORDER — DOCUSATE SODIUM 100 MG PO CAPS
100.0000 mg | ORAL_CAPSULE | Freq: Two times a day (BID) | ORAL | Status: DC
  Administered 2018-03-01 – 2018-03-02 (×3): 100 mg via ORAL
  Filled 2018-03-01 (×3): qty 1

## 2018-03-01 MED ORDER — ONDANSETRON HCL 4 MG/2ML IJ SOLN: 4.0000 mg | Freq: Four times a day (QID) | INTRAMUSCULAR | Status: DC | PRN

## 2018-03-01 MED ORDER — SODIUM CHLORIDE 0.9 % IV SOLN
1.0000 g | INTRAVENOUS | Status: DC
  Administered 2018-03-01: 1 g via INTRAVENOUS
  Filled 2018-03-01: qty 1
  Filled 2018-03-01: qty 10

## 2018-03-01 MED ORDER — ACETAMINOPHEN 325 MG PO TABS: 650.0000 mg | ORAL_TABLET | Freq: Four times a day (QID) | ORAL | Status: DC | PRN

## 2018-03-01 MED ORDER — MULTIVITAMINS PO CAPS: 1.0000 | ORAL_CAPSULE | Freq: Every day | ORAL | Status: DC

## 2018-03-01 MED ORDER — INSULIN ASPART 100 UNIT/ML ~~LOC~~ SOLN
0.0000 [IU] | Freq: Three times a day (TID) | SUBCUTANEOUS | Status: DC
  Administered 2018-03-01: 5 [IU] via SUBCUTANEOUS
  Administered 2018-03-01: 2 [IU] via SUBCUTANEOUS
  Administered 2018-03-02: 5 [IU] via SUBCUTANEOUS
  Filled 2018-03-01 (×3): qty 1

## 2018-03-01 MED ORDER — ASPIRIN EC 81 MG PO TBEC
81.0000 mg | DELAYED_RELEASE_TABLET | Freq: Every day | ORAL | Status: DC
  Administered 2018-03-01 – 2018-03-02 (×2): 81 mg via ORAL
  Filled 2018-03-01 (×2): qty 1

## 2018-03-01 MED ORDER — DIGOXIN 125 MCG PO TABS
0.1250 mg | ORAL_TABLET | Freq: Every day | ORAL | Status: DC
  Administered 2018-03-01 – 2018-03-02 (×2): 0.125 mg via ORAL
  Filled 2018-03-01 (×2): qty 1

## 2018-03-01 MED ORDER — FUROSEMIDE 20 MG PO TABS
20.0000 mg | ORAL_TABLET | Freq: Every day | ORAL | Status: DC
  Administered 2018-03-01: 20 mg via ORAL
  Filled 2018-03-01: qty 1

## 2018-03-01 MED ORDER — INSULIN ASPART 100 UNIT/ML ~~LOC~~ SOLN
0.0000 [IU] | Freq: Every day | SUBCUTANEOUS | Status: DC
  Administered 2018-03-01: 3 [IU] via SUBCUTANEOUS
  Administered 2018-03-01: 4 [IU] via SUBCUTANEOUS
  Filled 2018-03-01 (×2): qty 1

## 2018-03-01 MED ORDER — INSULIN GLARGINE 100 UNIT/ML ~~LOC~~ SOLN: 20.0000 [IU] | Freq: Every day | SUBCUTANEOUS | Status: DC

## 2018-03-01 MED ORDER — BISACODYL 5 MG PO TBEC: 5.0000 mg | DELAYED_RELEASE_TABLET | Freq: Every day | ORAL | Status: DC | PRN

## 2018-03-01 MED ORDER — PREMIER PROTEIN SHAKE
11.0000 [oz_av] | Freq: Two times a day (BID) | ORAL | Status: DC
  Administered 2018-03-01 – 2018-03-02 (×2): 11 [oz_av] via ORAL

## 2018-03-01 MED ORDER — PANTOPRAZOLE SODIUM 40 MG PO TBEC
40.0000 mg | DELAYED_RELEASE_TABLET | Freq: Every day | ORAL | Status: DC
  Administered 2018-03-01 – 2018-03-02 (×2): 40 mg via ORAL
  Filled 2018-03-01 (×2): qty 1

## 2018-03-01 MED ORDER — IPRATROPIUM-ALBUTEROL 0.5-2.5 (3) MG/3ML IN SOLN: 3.0000 mL | RESPIRATORY_TRACT | Status: DC | PRN

## 2018-03-01 MED ORDER — NITROGLYCERIN 0.4 MG SL SUBL: 0.4000 mg | SUBLINGUAL_TABLET | SUBLINGUAL | Status: DC | PRN

## 2018-03-01 MED ORDER — FUROSEMIDE 20 MG PO TABS: 20.0000 mg | ORAL_TABLET | Freq: Every day | ORAL | Status: DC | PRN

## 2018-03-01 MED ORDER — SODIUM CHLORIDE 0.9 % IV SOLN
Freq: Once | INTRAVENOUS | Status: AC
  Administered 2018-03-01: 01:00:00 via INTRAVENOUS

## 2018-03-01 MED ORDER — VITAMIN C 500 MG PO TABS
250.0000 mg | ORAL_TABLET | Freq: Two times a day (BID) | ORAL | Status: DC
  Administered 2018-03-01 – 2018-03-02 (×3): 250 mg via ORAL
  Filled 2018-03-01 (×4): qty 0.5

## 2018-03-01 MED ORDER — PRAVASTATIN SODIUM 20 MG PO TABS
40.0000 mg | ORAL_TABLET | Freq: Every day | ORAL | Status: DC
  Administered 2018-03-01: 40 mg via ORAL
  Filled 2018-03-01: qty 2

## 2018-03-01 MED ORDER — HYDROCODONE-ACETAMINOPHEN 5-325 MG PO TABS
1.0000 | ORAL_TABLET | ORAL | Status: DC | PRN
  Administered 2018-03-01: 1 via ORAL
  Filled 2018-03-01: qty 2

## 2018-03-01 MED ORDER — ACETAMINOPHEN 650 MG RE SUPP: 650.0000 mg | Freq: Four times a day (QID) | RECTAL | Status: DC | PRN

## 2018-03-01 MED ORDER — SODIUM CHLORIDE 0.9 % IV SOLN
500.0000 mg | INTRAVENOUS | Status: DC
  Administered 2018-03-02: 500 mg via INTRAVENOUS
  Filled 2018-03-01 (×2): qty 500

## 2018-03-01 MED ORDER — NICOTINE 14 MG/24HR TD PT24
14.0000 mg | MEDICATED_PATCH | Freq: Every day | TRANSDERMAL | Status: DC
  Administered 2018-03-01 – 2018-03-02 (×2): 14 mg via TRANSDERMAL
  Filled 2018-03-01 (×2): qty 1

## 2018-03-01 MED ORDER — GUAIFENESIN-DM 100-10 MG/5ML PO SYRP
5.0000 mL | ORAL_SOLUTION | ORAL | Status: DC | PRN
  Administered 2018-03-01: 5 mL via ORAL
  Filled 2018-03-01: qty 5

## 2018-03-01 NOTE — Progress Notes (Signed)
Advanced Care Plan.  Purpose of Encounter:  Parties in Attendance:  Patient's Decisional Capacity:  Medical Story: DavidJonesis a72 y.o.malewith a known history of diabetes type 2, V. tach, status post AICD placement, A. fib, chronic systolic stage IVCHF, CKD, COPD, hypertension. Patient presented to emergency room for elevated blood sugar at home in 500s.  He is found renal failure and pneumonia.  I discussed with the patient about his current condition, prognosis and CODE STATUS.  He clearly stated that he wants to be resuscitated and intubated to get him back if necessary.   Plan:  Code Status: Full code Time spent discussing advance care planning: 17 minutes.

## 2018-03-01 NOTE — Progress Notes (Signed)
Inpatient Diabetes Program Recommendations  AACE/ADA: New Consensus Statement on Inpatient Glycemic Control (2019)  Target Ranges:  Prepandial:   less than 140 mg/dL      Peak postprandial:   less than 180 mg/dL (1-2 hours)      Critically ill patients:  140 - 180 mg/dL  Results for [name], [name] (MRN [id number]) as of 03/01/2018 12:10  Ref. Range 02/28/2018 22:30 03/01/2018 01:45 03/01/2018 07:34 03/01/2018 11:36  Glucose-Capillary Latest Ref Range: 70 - 99 mg/dL 409280 (H) 811267 (H) 914118 (H) 295 (H)   Results for [name], [name] (MRN [id number]) as of 03/01/2018 12:10  Ref. Range 02/13/2018 05:11  Hemoglobin A1C Latest Ref Range: 4.8 - 5.6 % 12.4 (H)   Review of Glycemic Control  Diabetes history: DM2 Outpatient Diabetes medications: NPH 14 units BID Current orders for Inpatient glycemic control: Novolog 0-9 units TID with meals, Novolog 0-5 units QHS  Inpatient Diabetes Program Recommendations:  Insulin - Basal: Please consider ordering Lantus 15 units Q24H (based on 58 kg x 0.25 units). A1C: A1C 12.4% on 02/13/18 indicating an average glucose of 309 mg/dl. Inpatient Diabetes Coordinators spoke with patient and his wife during last hospital admission on 02/13/18 and 02/14/18. Per Diabetes coordinator note on 02/14/18, patient'Shah wife reported "is supposed to be taking NPH Insulin 14 units BID.  Pt often refuses to take the insulin b/c he tells his wife it makes him feel bad when his CBGs drop to the 120-130 mg/dl range."  Thanks, Orlando PennerMarie Kazim Corrales, RN, MSN, CDE Diabetes Coordinator Inpatient Diabetes Program [phone] (Team Pager from 8am to 5pm)

## 2018-03-01 NOTE — Progress Notes (Signed)
[employer] - [place] at [facility]   PATIENT NAME: [name]    MR#:  [id number]  DATE OF BIRTH:  12/22/1945  SUBJECTIVE:  CHIEF COMPLAINT:   Chief Complaint  Patient presents with  . Hyperglycemia  . Chest Pain   The patient denies any symptoms, he wants to go home. REVIEW OF SYSTEMS:  Review of Systems  Constitutional: Negative for chills, fever and malaise/fatigue.  HENT: Negative for sore throat.   Eyes: Negative for blurred vision and double vision.  Respiratory: Negative for cough, hemoptysis, sputum production, shortness of breath, wheezing and stridor.   Cardiovascular: Negative for chest pain, palpitations, orthopnea and leg swelling.  Gastrointestinal: Negative for abdominal pain, blood in stool, diarrhea, melena, nausea and vomiting.  Genitourinary: Negative for dysuria, flank pain and hematuria.  Musculoskeletal: Negative for back pain and joint pain.  Skin: Negative for rash.  Neurological: Negative for dizziness, sensory change, focal weakness, seizures, loss of consciousness, weakness and headaches.  Endo/Heme/Allergies: Negative for polydipsia.  Psychiatric/Behavioral: Negative for depression. The patient is not nervous/anxious.     DRUG ALLERGIES:   Allergies  Allergen Reactions  . Other     Intolerant to blood thinners - pt states that he has had "internal bleeding" while on blood thinners  . Adhesive [Tape] Other (See Comments)    "PULLS SKIN OFF" PER PT REPORT. OK TO USE PAPER TAPE.   VITALS:  Blood pressure 111/62, pulse 70, temperature 98.1 F (36.7 C), temperature source Oral, resp. rate 17, height 5\' 5"  (1.651 m), weight 128 lb 4.8 oz (58.2 kg), SpO2 97 %. PHYSICAL EXAMINATION:  Physical Exam  Constitutional: He is oriented to person, place, and time. He appears well-developed.  HENT:  Head: Normocephalic.  Mouth/Throat: Oropharynx is clear and moist.  Eyes: Pupils are equal, round, and reactive to light. Conjunctivae  and EOM are normal. No scleral icterus.  Neck: Normal range of motion. Neck supple. No JVD present. No tracheal deviation present.  Cardiovascular: Normal rate, regular rhythm and normal heart sounds. Exam reveals no gallop.  No murmur heard. Pulmonary/Chest: Effort normal and breath sounds normal. No respiratory distress. He has no wheezes. He has no rales.  Bilateral crackles.  Abdominal: Soft. Bowel sounds are normal. He exhibits no distension. There is no tenderness. There is no rebound.  Musculoskeletal: Normal range of motion. He exhibits no edema or tenderness.  Neurological: He is alert and oriented to person, place, and time. No cranial nerve deficit.  Skin: No rash noted. No erythema.  Psychiatric: He has a normal mood and affect.   LABORATORY PANEL:  Male CBC Recent Labs  Lab 03/01/18 0441  WBC 11.1*  HGB 11.7*  HCT 35.6*  PLT 174   ------------------------------------------------------------------------------------------------------------------ Chemistries  Recent Labs  Lab 03/01/18 0441  NA 136  K 4.0  CL 109  CO2 23  GLUCOSE 112*  BUN 18  CREATININE 1.01  CALCIUM 8.7*   RADIOLOGY:  Dg Chest 2 View  Result Date: 02/28/2018 CLINICAL DATA:  Hyperglycemia, tachycardia, left arm pain EXAM: CHEST - 2 VIEW COMPARISON:  02/13/2018 FINDINGS: Mild right lower lobe opacity, atelectasis versus pneumonia. Left lung is essentially clear. No pleural effusion or pneumothorax. The heart is normal in size. Postsurgical changes related to prior CABG. Right subclavian ICD. Degenerative changes of the visualized thoracolumbar spine. IMPRESSION: Mild right lower lobe opacity, atelectasis versus pneumonia. Electronically Signed   By: Charline BillsSriyesh  Krishnan M.D.   On: 02/28/2018 19:02   ASSESSMENT AND PLAN:  Kiefer Opheim  is a 72 y.o. male with a known history of diabetes type 2, V. tach, status post AICD placement, CHF, CKD, COPD, hypertension. Patient presented to emergency room for  elevated blood sugar at home in 500s.  He is found renal failure and pneumonia.  1. CAP, Continue IV Zithromax and Rocephin, nebulizer treatments and oxygen therapy prn. Robitussin as needed.  2.  Tachycardia likely related to infectious process, and dehydration.    Improved.   3.  COPD, without acute exacerbation.  Continue nebulizer as needed, Robitussin as needed. 4.  CKD 3, stable, creatinine at baseline.  Continue to monitor kidney function closely and avoid nephrotoxic medications. 5.    Hyperglycemia due to diabetes type 2, likely due to poor compliance. Continue sliding scale insulin and Levemir 15 units at bedtime per diabetes coordinator's recommendation.  6.    Chronic systolic CHF. continue furosemide and spironolactone  7.  History of A. fib, status post AICD.  Continue amiodarone, Lopressor and digoxin.  Continue aspirin.  8.  Tobacco abuse.  Smoking cessation was counseled for 3 minutes.  Nicotine patch.  All the records are reviewed and case discussed with Care Management/Social Worker. Management plans discussed with the patient, family and they are in agreement.  CODE STATUS: Full Code  TOTAL TIME TAKING CARE OF THIS PATIENT: 36 minutes.   More than 50% of the time was spent in counseling/coordination of care: YES  POSSIBLE D/C IN 2 DAYS, DEPENDING ON CLINICAL CONDITION.   Shaune Pollack M.D on 03/01/2018 at 3:34 PM  Between 7am to 6pm - Pager - [phone]  After 6pm go to [email or web address] - Therapist, nutritional Hospitalists

## 2018-03-01 NOTE — Progress Notes (Addendum)
Initial Nutrition Assessment  DOCUMENTATION CODES:   Severe malnutrition in context of chronic illness  INTERVENTION:   Premier Protein BID, each supplement provides 160 kcal and 30 grams of protein.   MVI daily  Vitamin C 250mg po BID  Liberalize diet  Pt at increased risk for nutrient deficiencies r/t his h/o gastrojejunostomy; recommend check thiamine, B12, folate, iron, calcium, zinc, copper, and vitamins D, A, E, & K yearly  NUTRITION DIAGNOSIS:   Severe Malnutrition related to chronic illness(CHF, DM, COPD, CKD) as evidenced by severe fat depletion, severe muscle depletion.  GOAL:   Patient will meet greater than or equal to 90% of their needs  MONITOR:   PO intake, Supplement acceptance, Labs, Weight trends, I & O's, Skin  REASON FOR ASSESSMENT:   Consult Assessment of nutrition requirement/status  ASSESSMENT:   72 y.o. male with a known history of diabetes type 2, V. tach, status post AICD placement, CHF, CKD III, COPD, hypertension, gastrojejunostomy with G-J tube admitted with hyperglycemia.    Met with pt in room today. RD familiar with this pt from previous recent admit. Pt reports good appetite and oral intake at baseline. Pt eating 100% of meals prior to last discharge and eating 100% of meals this admit. Pt does not drink any supplements at home but likes premier protein.  Pt denies any trouble chewing or swallowing. Per chart, pt has lost ~10lb(7%) but pt is unsure of the time frame in which the wt was lost. Pt with a h/o pancreatitis with pseudocyst and GOO; s/p gastrojejunostomy in 04/2013. At this time, pt had G-J tube placed and was dependent on nutrition support until 08/2013 when the tube was removed. Pt reports that he has not had any issues with eating since then. Pt reports he does take a daily MVI. RD discussed with pt how he is at increased risk for nutrient deficiencies r/t his gastrojejunostomy; recommend check thiamine, B12, folate, iron, calcium,  zinc, copper, and vitamins D, A, E, & K yearly. RD will liberalize diet and add supplements to help pt meet his estimated needs. Pt provided with basic diabetes education today.   Medications reviewed and include: aspirin, colace, lasix, heparin, insulin, melatonin, MVI, nicotine, aldactone, B12, azithromycin, ceftriaxone   Labs reviewed: wbc- 11.1(H) cbgs- 267, 118, 295 x 24 hrs  NUTRITION - FOCUSED PHYSICAL EXAM:    Most Recent Value  Orbital Region  Mild depletion  Upper Arm Region  Severe depletion  Thoracic and Lumbar Region  Moderate depletion  Buccal Region  Mild depletion  Temple Region  Mild depletion  Clavicle Bone Region  Moderate depletion  Clavicle and Acromion Bone Region  Moderate depletion  Scapular Bone Region  Mild depletion  Dorsal Hand  Mild depletion  Patellar Region  Severe depletion  Anterior Thigh Region  Severe depletion  Posterior Calf Region  Severe depletion  Edema (RD Assessment)  Mild  Hair  Reviewed  Eyes  Reviewed  Mouth  Reviewed  Skin  Reviewed  Nails  Reviewed     Diet Order:   Diet Order           Diet heart healthy/carb modified Room service appropriate? Yes; Fluid consistency: Thin  Diet effective now         EDUCATION NEEDS:   Education needs have been addressed  Skin:  Skin Assessment: Reviewed RN Assessment(ecchymosis )  Last BM:  pta  Height:   Ht Readings from Last 1 Encounters:  03/01/18 5' 5" (1.651 m)      Weight:   Wt Readings from Last 1 Encounters:  03/01/18 128 lb 4.8 oz (58.2 kg)    Ideal Body Weight:  61.8 kg  BMI:  Body mass index is 21.35 kg/m.  Estimated Nutritional Needs:   Kcal:  1600-1900kcal/day   Protein:  85-97g/day   Fluid:  >1.4L/day   Casey Campbell MS, RD, LDN Pager #- [phone] Office#- [phone] After Hours Pager: [phone]  

## 2018-03-02 LAB — GLUCOSE, CAPILLARY
GLUCOSE-CAPILLARY: 260 mg/dL — AB (ref 70–99)
Glucose-Capillary: 78 mg/dL (ref 70–99)

## 2018-03-02 MED ORDER — LEVOFLOXACIN 750 MG PO TABS
750.0000 mg | ORAL_TABLET | Freq: Every day | ORAL | Status: DC
  Administered 2018-03-02: 750 mg via ORAL
  Filled 2018-03-02: qty 1

## 2018-03-02 MED ORDER — LEVOFLOXACIN 750 MG PO TABS: 750.0000 mg | ORAL_TABLET | Freq: Every day | ORAL | 0 refills | Status: DC

## 2018-03-02 MED ORDER — GUAIFENESIN-DM 100-10 MG/5ML PO SYRP: 5.0000 mL | ORAL_SOLUTION | ORAL | 0 refills | Status: DC | PRN

## 2018-03-02 NOTE — Progress Notes (Signed)
Inpatient Diabetes Program Recommendations  AACE/ADA: New Consensus Statement on Inpatient Glycemic Control (2019)  Target Ranges:  Prepandial:   less than 140 mg/dL      Peak postprandial:   less than 180 mg/dL (1-2 hours)      Critically ill patients:  140 - 180 mg/dL   Results for [name], [name] (MRN [id number]) as of 03/02/2018 10:20  Ref. Range 03/01/2018 07:34 03/01/2018 11:36 03/01/2018 16:39 03/01/2018 21:51 03/01/2018 23:09 03/02/2018 07:55  Glucose-Capillary Latest Ref Range: 70 - 99 mg/dL 409118 (H) 811295 (H) 914183 (H) 370 (H) 344 (H) 78  Results for [name], [name] (MRN [id number]) as of 03/02/2018 10:20  Ref. Range 02/13/2018 05:11  Hemoglobin A1C Latest Ref Range: 4.8 - 5.6 % 12.4 (H)   Review of Glycemic Control  Diabetes history: DM2 Outpatient Diabetes medications: NPH 14 units BID Current orders for Inpatient glycemic control: Lantus 15 units QHS, Novolog 0-9 units TID with meals, Novolog 0-5 units QHS  Inpatient Diabetes Program Recommendations: Insulin - Basal: Please consider decreasing Levemir slightly to 13 units QHS. Insulin - Meal Coverage: Please consider ordering Novolog 4 units TID with meals for meal coverage if patient eats at least 50% of meals.  A1C: A1C 12.4% on 02/13/18 indicating an average glucose of 309 mg/dl. Inpatient Diabetes Coordinators spoke with patient and his wife during last hospital admission on 02/13/18 and 02/14/18. Per Diabetes coordinator note on 02/14/18, patient'Shah wife reported "is supposed to be taking NPH Insulin 14 units BID. Pt often refuses to take the insulin b/c he tells his wife it makes him feel bad when his CBGs drop to the 120-130 mg/dl range."  Thanks, Orlando PennerMarie Halyn Flaugher, RN, MSN, CDE Diabetes Coordinator Inpatient Diabetes Program [phone] (Team Pager from 8am to 5pm)

## 2018-03-02 NOTE — Progress Notes (Signed)
Discharge instructions reviewed with patient and wife including new medications and followup appointment.  Understanding was verbalized and all questions were answered. Patient discharged home via wheelchair in stable condition escorted by nursing staff.

## 2018-03-02 NOTE — Discharge Instructions (Signed)
Smoking cessation  

## 2018-03-02 NOTE — Care Management Important Message (Signed)
Copy of signed IM left with patient in room.  

## 2018-03-02 NOTE — Discharge Summary (Signed)
[employer] - [place] [facility]   PATIENT NAME: [name]    MR#:  [id number]  DATE OF BIRTH:  Jan 02, 1946  DATE OF ADMISSION:  02/28/2018   ADMITTING PHYSICIAN: Cammy Copa, MD  DATE OF DISCHARGE: 03/02/2018 PRIMARY CARE PHYSICIAN: Lauro Regulus, MD   ADMISSION DIAGNOSIS:  Hyperglycemia [R73.9] Chest pain, unspecified type [R07.9] DISCHARGE DIAGNOSIS:  Active Problems:   PNA (pneumonia)  SECONDARY DIAGNOSIS:   Past Medical History:  Diagnosis Date  . AICD (automatic cardioverter/defibrillator) present 2010   battery changed out 2015  . Anginal pain (HCC)    infrequent pain. usually feels as pain in the arms  . Bilateral swelling of feet   . CHF (congestive heart failure) (HCC)   . Chronic cough   . Chronic kidney disease    renal insufficiency.started on spironalactone  . COPD (chronic obstructive pulmonary disease) (HCC)    does not use o2  . Coronary artery disease   . Diabetes mellitus without complication (HCC)   . Dysrhythmia   . GERD (gastroesophageal reflux disease)   . Hepatitis   . Hypercholesteremia   . Hypertension    history of,   . Myocardial infarction (HCC) 1993   several. per patient, has had 10 heart attacks  . Pneumonia   . Presence of permanent cardiac pacemaker    and defibrilator   HOSPITAL COURSE:  DavidJonesis a72 y.o.malewith a known history of diabetes type 2, V. tach, status post AICD placement,CHF, CKD, COPD, hypertension. Patient presented to emergency room for elevated blood sugar at home in 500s.  He is found renal failure and pneumonia.  1. CAP, He has been treated with IV Zithromax and Rocephin,nebulizer treatments and oxygen therapy prn. Robitussin as needed.  Changed to Levaquin p.o.  2.Tachycardia likely related to infectious process,and dehydration.   Improved.   3.COPD, without acute exacerbation. Continue nebulizer as needed, Robitussin as needed. 4.CKD 3,stable,  creatinine at baseline, avoid nephrotoxic medications.  Stable. 5.  Hyperglycemia due to diabetes type 2, likely due to poor compliance. Continue sliding scale insulin and Levemir 15 units at bedtime per diabetes coordinator's recommendation.  6.  Chronic systolic CHF. continue furosemide and spironolactone  7.  History of A. fib, status post AICD.  Continue amiodarone, Lopressor and digoxin.  Continue aspirin.  8.  Tobacco abuse.  Smoking cessation was counseled for 3 minutes.  Nicotine patch. DISCHARGE CONDITIONS:  Stable, discharge to home today. CONSULTS OBTAINED:   DRUG ALLERGIES:   Allergies  Allergen Reactions  . Other     Intolerant to blood thinners - pt states that he has had "internal bleeding" while on blood thinners  . Adhesive [Tape] Other (See Comments)    "PULLS SKIN OFF" PER PT REPORT. OK TO USE PAPER TAPE.   DISCHARGE MEDICATIONS:   Allergies as of 03/02/2018      Reactions   Other    Intolerant to blood thinners - pt states that he has had "internal bleeding" while on blood thinners   Adhesive [tape] Other (See Comments)   "PULLS SKIN OFF" PER PT REPORT. OK TO USE PAPER TAPE.      Medication List    TAKE these medications   amiodarone 400 MG tablet Commonly known as:  PACERONE Take 1 tablet (400 mg total) by mouth daily.   aspirin EC 81 MG tablet Take 81 mg by mouth daily.   digoxin 0.125 MG tablet Commonly known as:  LANOXIN Take 0.125 mg by mouth daily.  furosemide 20 MG tablet Commonly known as:  LASIX Take 20 mg by mouth daily as needed for fluid.   guaiFENesin-dextromethorphan 100-10 MG/5ML syrup Commonly known as:  ROBITUSSIN DM Take 5 mLs by mouth every 4 (four) hours as needed for cough.   insulin NPH Human 100 UNIT/ML injection Commonly known as:  HUMULIN N,NOVOLIN N Inject 0.18 mLs (18 Units total) into the skin 2 (two) times daily before a meal. What changed:  how much to take   levofloxacin 750 MG tablet Commonly  known as:  LEVAQUIN Take 1 tablet (750 mg total) by mouth daily.   Melatonin 5 MG Tabs Take 10 mg by mouth at bedtime.   metoprolol succinate 50 MG 24 hr tablet Commonly known as:  TOPROL-XL Take 50 mg by mouth 2 (two) times daily.   multivitamin capsule Take 1 capsule by mouth daily.   nitroGLYCERIN 0.4 MG SL tablet Commonly known as:  NITROSTAT Place 0.4 mg under the tongue every 5 (five) minutes as needed for chest pain. What changed:  Another medication with the same name was changed. Make sure you understand how and when to take each.   nitroGLYCERIN 0.1 mg/hr patch Commonly known as:  NITRODUR - Dosed in mg/24 hr Place 1 patch (0.1 mg total) onto the skin daily as needed. What changed:  reasons to take this   omeprazole 40 MG capsule Commonly known as:  PRILOSEC Take 40 mg by mouth daily.   pravastatin 40 MG tablet Commonly known as:  PRAVACHOL Take 40 mg by mouth at bedtime.   spironolactone 25 MG tablet Commonly known as:  ALDACTONE Take 25 mg by mouth every morning before breakfast.   triamcinolone cream 0.5 % Commonly known as:  KENALOG Apply 1 application topically 2 (two) times daily.   vitamin B-12 1000 MCG tablet Commonly known as:  CYANOCOBALAMIN Take 1,000 mcg by mouth 2 (two) times daily.        DISCHARGE INSTRUCTIONS:  See AVS.  If you experience worsening of your admission symptoms, develop shortness of breath, life threatening emergency, suicidal or homicidal thoughts you must seek medical attention immediately by calling 911 or calling your MD immediately  if symptoms less severe.  You Must read complete instructions/literature along with all the possible adverse reactions/side effects for all the Medicines you take and that have been prescribed to you. Take any new Medicines after you have completely understood and accpet all the possible adverse reactions/side effects.   Please note  You were cared for by a hospitalist during your  hospital stay. If you have any questions about your discharge medications or the care you received while you were in the hospital after you are discharged, you can call the unit and asked to speak with the hospitalist on call if the hospitalist that took care of you is not available. Once you are discharged, your primary care physician will handle any further medical issues. Please note that NO REFILLS for any discharge medications will be authorized once you are discharged, as it is imperative that you return to your primary care physician (or establish a relationship with a primary care physician if you do not have one) for your aftercare needs so that they can reassess your need for medications and monitor your lab values.    On the day of Discharge:  VITAL SIGNS:  Blood pressure 97/69, pulse 70, temperature 97.8 F (36.6 C), temperature source Oral, resp. rate 20, height 5\' 5"  (1.651 m), weight 124 lb 11.2  oz (56.6 kg), SpO2 97 %. PHYSICAL EXAMINATION:  GENERAL:  72 y.o.-year-old patient lying in the bed with no acute distress.  EYES: Pupils equal, round, reactive to light and accommodation. No scleral icterus. Extraocular muscles intact.  HEENT: Head atraumatic, normocephalic. Oropharynx and nasopharynx clear.  NECK:  Supple, no jugular venous distention. No thyroid enlargement, no tenderness.  LUNGS: Normal breath sounds bilaterally, no wheezing, rales,mild rhonchi. No use of accessory muscles of respiration.  CARDIOVASCULAR: S1, S2 normal. No murmurs, rubs, or gallops.  ABDOMEN: Soft, non-tender, non-distended. Bowel sounds present. No organomegaly or mass.  EXTREMITIES: No pedal edema, cyanosis, or clubbing.  NEUROLOGIC: Cranial nerves II through XII are intact. Muscle strength 5/5 in all extremities. Sensation intact. Gait not checked.  PSYCHIATRIC: The patient is alert and oriented x 3.  SKIN: No obvious rash, lesion, or ulcer.  DATA REVIEW:   CBC Recent Labs  Lab 03/01/18 0441    WBC 11.1*  HGB 11.7*  HCT 35.6*  PLT 174    Chemistries  Recent Labs  Lab 03/01/18 0441  NA 136  K 4.0  CL 109  CO2 23  GLUCOSE 112*  BUN 18  CREATININE 1.01  CALCIUM 8.7*     Microbiology Results  Results for orders placed or performed during the hospital encounter of 02/13/18  Urine Culture     Status: Abnormal   Collection Time: 02/13/18  4:02 PM  Result Value Ref Range Status   Specimen Description   Final    URINE, RANDOM Performed at [facility], [address] [place], [place] [address]    Special Requests   Final    NONE Performed at [facility], [address] [place], [place] [address]    Culture >=100,000 COLONIES/mL ESCHERICHIA COLI (A)  Final   Report Status 02/15/2018 FINAL  Final   Organism ID, Bacteria ESCHERICHIA COLI (A)  Final      Susceptibility   Escherichia coli - MIC*    AMPICILLIN 4 SENSITIVE Sensitive     CEFAZOLIN <=4 SENSITIVE Sensitive     CEFTRIAXONE <=1 SENSITIVE Sensitive     CIPROFLOXACIN <=0.25 SENSITIVE Sensitive     GENTAMICIN <=1 SENSITIVE Sensitive     IMIPENEM <=0.25 SENSITIVE Sensitive     NITROFURANTOIN <=16 SENSITIVE Sensitive     TRIMETH/SULFA <=20 SENSITIVE Sensitive     AMPICILLIN/SULBACTAM <=2 SENSITIVE Sensitive     PIP/TAZO <=4 SENSITIVE Sensitive     Extended ESBL NEGATIVE Sensitive     * >=100,000 COLONIES/mL ESCHERICHIA COLI    RADIOLOGY:  No results found.   Management plans discussed with the patient, family and they are in agreement.  CODE STATUS: Full Code   TOTAL TIME TAKING CARE OF THIS PATIENT: 33 minutes.    Shaune PollackQing Nozomi Mettler M.D on 03/02/2018 at 1:18 PM  Between 7am to 6pm - Pager - [phone]  After 6pm go to [email or web address] - Scientist, research (life sciences)password [email or web address]  [facility] [place] [facility]  Office  [phone]  CC: Primary care physician; Lauro RegulusAnderson, Marshall W, MD   Note: This dictation was prepared with Dragon dictation along with smaller phrase technology. Any  transcriptional errors that result from this process are unintentional.

## 2018-03-09 ENCOUNTER — Emergency Department: Payer: MEDICARE

## 2018-03-09 ENCOUNTER — Encounter: Payer: Self-pay | Admitting: Emergency Medicine

## 2018-03-09 ENCOUNTER — Inpatient Hospital Stay
Admission: EM | Admit: 2018-03-09 | Discharge: 2018-03-11 | DRG: 378 | Disposition: A | Discharge status: Home / Self Care | Payer: MEDICARE | Attending: Internal Medicine | Admitting: Internal Medicine

## 2018-03-09 ENCOUNTER — Other Ambulatory Visit: Payer: Self-pay

## 2018-03-09 DIAGNOSIS — R778 Other specified abnormalities of plasma proteins: Secondary | ICD-10-CM

## 2018-03-09 DIAGNOSIS — E875 Hyperkalemia: Secondary | ICD-10-CM | POA: Diagnosis not present

## 2018-03-09 DIAGNOSIS — Z9049 Acquired absence of other specified parts of digestive tract: Secondary | ICD-10-CM

## 2018-03-09 DIAGNOSIS — B029 Zoster without complications: Secondary | ICD-10-CM | POA: Diagnosis present

## 2018-03-09 DIAGNOSIS — Z79899 Other long term (current) drug therapy: Secondary | ICD-10-CM

## 2018-03-09 DIAGNOSIS — E785 Hyperlipidemia, unspecified: Secondary | ICD-10-CM | POA: Diagnosis present

## 2018-03-09 DIAGNOSIS — K219 Gastro-esophageal reflux disease without esophagitis: Secondary | ICD-10-CM | POA: Diagnosis present

## 2018-03-09 DIAGNOSIS — I252 Old myocardial infarction: Secondary | ICD-10-CM

## 2018-03-09 DIAGNOSIS — N183 Chronic kidney disease, stage 3 (moderate): Secondary | ICD-10-CM | POA: Diagnosis present

## 2018-03-09 DIAGNOSIS — Z7982 Long term (current) use of aspirin: Secondary | ICD-10-CM

## 2018-03-09 DIAGNOSIS — R7989 Other specified abnormal findings of blood chemistry: Secondary | ICD-10-CM

## 2018-03-09 DIAGNOSIS — F17210 Nicotine dependence, cigarettes, uncomplicated: Secondary | ICD-10-CM | POA: Diagnosis not present

## 2018-03-09 DIAGNOSIS — Z79890 Hormone replacement therapy (postmenopausal): Secondary | ICD-10-CM

## 2018-03-09 DIAGNOSIS — J449 Chronic obstructive pulmonary disease, unspecified: Secondary | ICD-10-CM | POA: Diagnosis not present

## 2018-03-09 DIAGNOSIS — K922 Gastrointestinal hemorrhage, unspecified: Secondary | ICD-10-CM | POA: Diagnosis not present

## 2018-03-09 DIAGNOSIS — I2510 Atherosclerotic heart disease of native coronary artery without angina pectoris: Secondary | ICD-10-CM | POA: Diagnosis present

## 2018-03-09 DIAGNOSIS — E119 Type 2 diabetes mellitus without complications: Secondary | ICD-10-CM

## 2018-03-09 DIAGNOSIS — R945 Abnormal results of liver function studies: Secondary | ICD-10-CM | POA: Diagnosis present

## 2018-03-09 DIAGNOSIS — I5022 Chronic systolic (congestive) heart failure: Secondary | ICD-10-CM | POA: Diagnosis present

## 2018-03-09 DIAGNOSIS — I10 Essential (primary) hypertension: Secondary | ICD-10-CM | POA: Diagnosis present

## 2018-03-09 DIAGNOSIS — Z955 Presence of coronary angioplasty implant and graft: Secondary | ICD-10-CM

## 2018-03-09 DIAGNOSIS — I130 Hypertensive heart and chronic kidney disease with heart failure and stage 1 through stage 4 chronic kidney disease, or unspecified chronic kidney disease: Secondary | ICD-10-CM | POA: Diagnosis not present

## 2018-03-09 DIAGNOSIS — E1122 Type 2 diabetes mellitus with diabetic chronic kidney disease: Secondary | ICD-10-CM | POA: Diagnosis present

## 2018-03-09 DIAGNOSIS — Z95810 Presence of automatic (implantable) cardiac defibrillator: Secondary | ICD-10-CM

## 2018-03-09 DIAGNOSIS — Z961 Presence of intraocular lens: Secondary | ICD-10-CM | POA: Diagnosis present

## 2018-03-09 DIAGNOSIS — I248 Other forms of acute ischemic heart disease: Secondary | ICD-10-CM | POA: Diagnosis not present

## 2018-03-09 DIAGNOSIS — Z8249 Family history of ischemic heart disease and other diseases of the circulatory system: Secondary | ICD-10-CM

## 2018-03-09 DIAGNOSIS — Z794 Long term (current) use of insulin: Secondary | ICD-10-CM

## 2018-03-09 DIAGNOSIS — Z8719 Personal history of other diseases of the digestive system: Secondary | ICD-10-CM

## 2018-03-09 DIAGNOSIS — Z951 Presence of aortocoronary bypass graft: Secondary | ICD-10-CM

## 2018-03-09 DIAGNOSIS — K920 Hematemesis: Secondary | ICD-10-CM | POA: Diagnosis present

## 2018-03-09 DIAGNOSIS — Z9842 Cataract extraction status, left eye: Secondary | ICD-10-CM

## 2018-03-09 DIAGNOSIS — Z91048 Other nonmedicinal substance allergy status: Secondary | ICD-10-CM

## 2018-03-09 LAB — COMPREHENSIVE METABOLIC PANEL
ALK PHOS: 139 U/L — AB (ref 38–126)
ALT: 31 U/L (ref 0–44)
ANION GAP: 4 — AB (ref 5–15)
AST: 62 U/L — AB (ref 15–41)
Albumin: 3.6 g/dL (ref 3.5–5.0)
BILIRUBIN TOTAL: 1.2 mg/dL (ref 0.3–1.2)
BUN: 19 mg/dL (ref 8–23)
CALCIUM: 9.1 mg/dL (ref 8.9–10.3)
CO2: 27 mmol/L (ref 22–32)
Chloride: 106 mmol/L (ref 98–111)
Creatinine, Ser: 1.29 mg/dL — ABNORMAL HIGH (ref 0.61–1.24)
GFR calc non Af Amer: 53 mL/min — ABNORMAL LOW (ref >60)
Glucose, Bld: 231 mg/dL — ABNORMAL HIGH (ref 70–99)
Potassium: 5.2 mmol/L — ABNORMAL HIGH (ref 3.5–5.1)
Sodium: 137 mmol/L (ref 135–145)
TOTAL PROTEIN: 7.1 g/dL (ref 6.5–8.1)

## 2018-03-09 LAB — CBC WITH DIFFERENTIAL/PLATELET
Basophils Absolute: 0 10*3/uL (ref 0–0.1)
Basophils Relative: 0 %
EOS PCT: 2 %
Eosinophils Absolute: 0.2 10*3/uL (ref 0–0.7)
HCT: 38.1 % — ABNORMAL LOW (ref 40.0–52.0)
Hemoglobin: 13.3 g/dL (ref 13.0–18.0)
LYMPHS PCT: 17 %
Lymphs Abs: 1.7 10*3/uL (ref 1.0–3.6)
MCH: 28.8 pg (ref 26.0–34.0)
MCHC: 34.8 g/dL (ref 32.0–36.0)
MCV: 82.7 fL (ref 80.0–100.0)
MONO ABS: 1.2 10*3/uL — AB (ref 0.2–1.0)
MONOS PCT: 12 %
NEUTROS ABS: 7.2 10*3/uL — AB (ref 1.4–6.5)
Neutrophils Relative %: 69 %
Platelets: 213 10*3/uL (ref 150–440)
RBC: 4.6 MIL/uL (ref 4.40–5.90)
RDW: 16.1 % — AB (ref 11.5–14.5)
WBC: 10.4 10*3/uL (ref 3.8–10.6)

## 2018-03-09 LAB — PROTIME-INR
INR: 1.28
Prothrombin Time: 15.9 seconds — ABNORMAL HIGH (ref 11.4–15.2)

## 2018-03-09 LAB — HEMOGLOBIN
HEMOGLOBIN: 12.5 g/dL — AB (ref 13.0–18.0)
HEMOGLOBIN: 12.3 g/dL — AB (ref 13.0–18.0)
Hemoglobin: 12.5 g/dL — ABNORMAL LOW (ref 13.0–18.0)

## 2018-03-09 LAB — TROPONIN I: Troponin I: 0.03 ng/mL (ref <0.03)

## 2018-03-09 LAB — TYPE AND SCREEN
ABO/RH(D): B POS
ANTIBODY SCREEN: NEGATIVE

## 2018-03-09 LAB — ETHANOL: Alcohol, Ethyl (B): <10 mg/dL (ref <10)

## 2018-03-09 LAB — GLUCOSE, CAPILLARY
GLUCOSE-CAPILLARY: 101 mg/dL — AB (ref 70–99)
GLUCOSE-CAPILLARY: 171 mg/dL — AB (ref 70–99)
Glucose-Capillary: 147 mg/dL — ABNORMAL HIGH (ref 70–99)

## 2018-03-09 LAB — LIPASE, BLOOD: Lipase: 37 U/L (ref 11–51)

## 2018-03-09 MED ORDER — SODIUM CHLORIDE 0.9 % IV BOLUS
1000.0000 mL | Freq: Once | INTRAVENOUS | Status: AC
  Administered 2018-03-09: 1000 mL via INTRAVENOUS

## 2018-03-09 MED ORDER — AMIODARONE HCL 200 MG PO TABS
400.0000 mg | ORAL_TABLET | Freq: Every day | ORAL | Status: DC
  Administered 2018-03-10 – 2018-03-11 (×2): 400 mg via ORAL
  Filled 2018-03-09 (×2): qty 2

## 2018-03-09 MED ORDER — DIGOXIN 125 MCG PO TABS
0.1250 mg | ORAL_TABLET | Freq: Every day | ORAL | Status: DC
  Administered 2018-03-10 – 2018-03-11 (×2): 0.125 mg via ORAL
  Filled 2018-03-09 (×3): qty 1

## 2018-03-09 MED ORDER — PANTOPRAZOLE SODIUM 40 MG IV SOLR: 40.0000 mg | Freq: Two times a day (BID) | INTRAVENOUS | Status: DC

## 2018-03-09 MED ORDER — INSULIN ASPART 100 UNIT/ML ~~LOC~~ SOLN: 0.0000 [IU] | Freq: Four times a day (QID) | SUBCUTANEOUS | Status: DC

## 2018-03-09 MED ORDER — ACYCLOVIR 200 MG PO CAPS
800.0000 mg | ORAL_CAPSULE | Freq: Every day | ORAL | Status: DC
  Administered 2018-03-09 – 2018-03-11 (×11): 800 mg via ORAL
  Filled 2018-03-09 (×14): qty 4

## 2018-03-09 MED ORDER — PANTOPRAZOLE SODIUM 40 MG IV SOLR
8.0000 mg/h | INTRAVENOUS | Status: DC
  Administered 2018-03-09: 8 mg/h via INTRAVENOUS
  Filled 2018-03-09: qty 80

## 2018-03-09 MED ORDER — ACETAMINOPHEN 325 MG PO TABS: 650.0000 mg | ORAL_TABLET | Freq: Four times a day (QID) | ORAL | Status: DC | PRN

## 2018-03-09 MED ORDER — ASPIRIN EC 81 MG PO TBEC
81.0000 mg | DELAYED_RELEASE_TABLET | Freq: Every day | ORAL | Status: DC
  Administered 2018-03-10: 81 mg via ORAL
  Filled 2018-03-09: qty 1

## 2018-03-09 MED ORDER — ONDANSETRON HCL 4 MG/2ML IJ SOLN: 4.0000 mg | Freq: Four times a day (QID) | INTRAMUSCULAR | Status: DC | PRN

## 2018-03-09 MED ORDER — DIGOXIN 0.25 MG/ML IJ SOLN: 0.1250 mg | Freq: Every day | INTRAMUSCULAR | Status: DC

## 2018-03-09 MED ORDER — ACYCLOVIR 800 MG PO TABS
800.0000 mg | ORAL_TABLET | Freq: Every day | ORAL | Status: DC
  Filled 2018-03-09 (×2): qty 1

## 2018-03-09 MED ORDER — METOPROLOL TARTRATE 5 MG/5ML IV SOLN: 5.0000 mg | Freq: Three times a day (TID) | INTRAVENOUS | Status: DC

## 2018-03-09 MED ORDER — ACETAMINOPHEN 650 MG RE SUPP: 650.0000 mg | Freq: Four times a day (QID) | RECTAL | Status: DC | PRN

## 2018-03-09 MED ORDER — ONDANSETRON HCL 4 MG PO TABS: 4.0000 mg | ORAL_TABLET | Freq: Four times a day (QID) | ORAL | Status: DC | PRN

## 2018-03-09 MED ORDER — SODIUM CHLORIDE 0.9 % IV SOLN
80.0000 mg | Freq: Once | INTRAVENOUS | Status: AC
Start: 2018-03-09 — End: 2018-03-09
  Administered 2018-03-09: 80 mg via INTRAVENOUS
  Filled 2018-03-09: qty 80

## 2018-03-09 MED ORDER — METOPROLOL SUCCINATE ER 50 MG PO TB24
50.0000 mg | ORAL_TABLET | Freq: Two times a day (BID) | ORAL | Status: DC
  Administered 2018-03-09 – 2018-03-11 (×4): 50 mg via ORAL
  Filled 2018-03-09 (×4): qty 1

## 2018-03-09 MED ORDER — PRAVASTATIN SODIUM 20 MG PO TABS
40.0000 mg | ORAL_TABLET | Freq: Every day | ORAL | Status: DC
  Administered 2018-03-09 – 2018-03-10 (×2): 40 mg via ORAL
  Filled 2018-03-09 (×2): qty 2

## 2018-03-09 NOTE — Progress Notes (Signed)
Took over care of patient at 3pm. 10am meds not given. EKG ordered at 1321 but was not done prior to taking over patient.

## 2018-03-09 NOTE — Progress Notes (Signed)
Pt admitted to unit. Negative pressure room for contact due to shingles outbreak. Patient edcuated. Call bell within reach, bed in lowest postion. Vss. Npo per gi.

## 2018-03-09 NOTE — Progress Notes (Signed)
Contacted pharmacy regarding medications not being verified. Pharmacist asked that i contact md to obtain an ekg before they release his cardiac medications.  Notified md. ekg ordered. Reported off to next shift rn that after ekg completed to contact pharmacy.

## 2018-03-09 NOTE — Progress Notes (Signed)
Inpatient Diabetes Program Recommendations  AACE/ADA: New Consensus Statement on Inpatient Glycemic Control (2019)  Target Ranges:  Prepandial:   less than 140 mg/dL      Peak postprandial:   less than 180 mg/dL (1-2 hours)      Critically ill patients:  140 - 180 mg/dL   Results for [name], [name] (MRN [id number]) as of 03/09/2018 10:35  Ref. Range 03/09/2018 04:07  Glucose Latest Ref Range: 70 - 99 mg/dL 102231 (H)  Results for [name], [name] (MRN [id number]) as of 03/09/2018 10:35  Ref. Range 02/13/2018 05:11  Hemoglobin A1C Latest Ref Range: 4.8 - 5.6 % 12.4 (H)   Review of Glycemic Control  Diabetes history: DM2 Outpatient Diabetes medications: NPH 15 units BID Current orders for Inpatient glycemic control: Novolog 0-9 units Q6H  Inpatient Diabetes Program Recommendations:  Insulin - Basal: If glucose is consitently greater than 180 mg/dl with Novolog correction scale, please consider ordering Lantus 8 units Q24H (based on 56 kg x 0.15 units). HgbA1C: A1C 12.4% on 02/13/18 indicating an average glucose of 309 mg/dl.  NOTE: Inpatient Diabetes Coordinator spoke with patient and his wifeduring prior hospital admission on 02/13/18 and 02/14/18. Per Diabetes Coordinator note on 02/14/18, patient'Shah wife reported "is supposed to be taking NPH Insulin 14 units BID. Pt often refuses to take the insulin b/c he tells his wife it makes him feel bad when his CBGs drop to the 120-130 mg/dl range."  Thanks, Luis PennerMarie Max Nuno, RN, MSN, CDE Diabetes Coordinator Inpatient Diabetes Program [phone] (Team Pager from 8am to 5pm)

## 2018-03-09 NOTE — ED Provider Notes (Signed)
[facility] Emergency Department Provider Note   ____________________________________________   First MD Initiated Contact with Patient 03/09/18 [phone]     (approximate)  I have reviewed the triage vital signs and the nursing notes.   HISTORY  Chief Complaint Emesis    HPI Luis ZWAHLEN is a 72 y.o. male brought to the ED from home via EMS with a chief complaint of coffee-ground emesis.  Patient reports 4-5 episodes of coffee-ground emesis tonight.  Denies associated abdominal pain or diarrhea.  Similar symptoms years ago and states he was hospitalized for 1 week at [facility] but they could never find the source of the bleeding.  At that time he was on a blood thinner.  Currently patient is just on a baby aspirin daily.  Recently started 400 mg ibuprofen.  Denies recent fever, chills, chest pain, shortness of breath, dysuria, diarrhea.  Denies recent travel or trauma.   Past Medical History:  Diagnosis Date  . AICD (automatic cardioverter/defibrillator) present 2010   battery changed out 2015  . Anginal pain (HCC)    infrequent pain. usually feels as pain in the arms  . Bilateral swelling of feet   . CHF (congestive heart failure) (HCC)   . Chronic cough   . Chronic kidney disease    renal insufficiency.started on spironalactone  . COPD (chronic obstructive pulmonary disease) (HCC)    does not use o2  . Coronary artery disease   . Diabetes mellitus without complication (HCC)   . Dysrhythmia   . GERD (gastroesophageal reflux disease)   . Hepatitis   . Hypercholesteremia   . Hypertension    history of,   . Myocardial infarction (HCC) 1993   several. per patient, has had 10 heart attacks  . Pneumonia   . Presence of permanent cardiac pacemaker    and defibrilator    Patient Active Problem List   Diagnosis Date Noted  . Coffee ground emesis 03/09/2018  . Shingles 03/09/2018  . PNA (pneumonia) 02/28/2018  . Ventricular tachycardia (HCC) 02/13/2018    . Protein-calorie malnutrition, severe 02/13/2018  . Hyperlipidemia 05/17/2017  . Hypertension 04/21/2017  . Diabetes mellitus without complication (HCC) 04/21/2017  . Tobacco use disorder 04/21/2017  . Atherosclerosis of native arteries of extremity with intermittent claudication (HCC) 04/21/2017    Past Surgical History:  Procedure Laterality Date  . CARDIAC CATHETERIZATION     with stents  . CARDIAC DEFIBRILLATOR PLACEMENT  2010  . CATARACT EXTRACTION W/PHACO Left 02/23/2016   Procedure: CATARACT EXTRACTION PHACO AND INTRAOCULAR LENS PLACEMENT (IOC);  Surgeon: Galen Manila, MD;  Location: [facility] ORS;  Service: Ophthalmology;  Laterality: Left;  [place] 1.05 AP% 22.9 CDE 15.02 Fluid pack lot # [id number]  . CHOLECYSTECTOMY  2014  . CORONARY ARTERY BYPASS GRAFT    . feeding tube removal  2014  . GASTROSTOMY W/ FEEDING TUBE  2014   after galbladder surgery.  . LOWER EXTREMITY ANGIOGRAPHY Left 04/26/2017   Procedure: Lower Extremity Angiography;  Surgeon: Annice Needy, MD;  Location: [facility] INVASIVE CV LAB;  Service: Cardiovascular;  Laterality: Left;  . LOWER EXTREMITY ANGIOGRAPHY Right 05/25/2017   Procedure: Lower Extremity Angiography;  Surgeon: Annice Needy, MD;  Location: [facility] INVASIVE CV LAB;  Service: Cardiovascular;  Laterality: Right;  . PANCREATIC CYST EXCISION  2014   had hepatitis at this time  . STENT PLACEMENT NON-VASCULAR ([facility] HX)     cardiac stents x many    Prior to Admission medications   Medication Sig  Start Date End Date Taking? Authorizing Provider  amiodarone (PACERONE) 400 MG tablet Take 1 tablet (400 mg total) by mouth daily. 02/15/18   Enedina Finner, MD  aspirin EC 81 MG tablet Take 81 mg by mouth daily.    [provider]  digoxin (LANOXIN) 0.125 MG tablet Take 0.125 mg by mouth daily.    [provider]  furosemide (LASIX) 20 MG tablet Take 20 mg by mouth daily as needed for fluid.     [provider]  guaiFENesin-dextromethorphan  (ROBITUSSIN DM) 100-10 MG/5ML syrup Take 5 mLs by mouth every 4 (four) hours as needed for cough. 03/02/18   Shaune Pollack, MD  insulin NPH Human (HUMULIN N,NOVOLIN N) 100 UNIT/ML injection Inject 0.18 mLs (18 Units total) into the skin 2 (two) times daily before a meal. Patient taking differently: Inject 15 Units into the skin 2 (two) times daily before a meal.  02/14/18   Enedina Finner, MD  Melatonin 5 MG TABS Take 10 mg by mouth at bedtime.     [provider]  metoprolol succinate (TOPROL-XL) 50 MG 24 hr tablet Take 50 mg by mouth 2 (two) times daily.    [provider]  Multiple Vitamin (MULTIVITAMIN) capsule Take 1 capsule by mouth daily.    [provider]  nitroGLYCERIN (NITRODUR - DOSED IN MG/24 HR) 0.1 mg/hr patch Place 1 patch (0.1 mg total) onto the skin daily as needed. Patient taking differently: Place 0.1 mg onto the skin daily as needed (chest pain).  02/14/18   Enedina Finner, MD  nitroGLYCERIN (NITROSTAT) 0.4 MG SL tablet Place 0.4 mg under the tongue every 5 (five) minutes as needed for chest pain.    [provider]  omeprazole (PRILOSEC) 40 MG capsule Take 40 mg by mouth daily.    [provider]  pravastatin (PRAVACHOL) 40 MG tablet Take 40 mg by mouth at bedtime.    [provider]  spironolactone (ALDACTONE) 25 MG tablet Take 25 mg by mouth every morning before breakfast. 05/17/16   [provider]  triamcinolone cream (KENALOG) 0.5 % Apply 1 application topically 2 (two) times daily. 01/17/18   [provider]  vitamin B-12 (CYANOCOBALAMIN) 1000 MCG tablet Take 1,000 mcg by mouth 2 (two) times daily.    [provider]    Allergies Other and Adhesive [tape]  Family History  Problem Relation Age of Onset  . Heart attack Mother   . Cancer Brother   . CAD Brother     Social History Social History   Tobacco Use  . Smoking status: Current Every Day Smoker    Packs/day: 1.00    Years: 55.00    Pack  years: 55.00  . Smokeless tobacco: Never Used  Substance Use Topics  . Alcohol use: No    Comment: rare  . Drug use: No    Review of Systems  Constitutional: No fever/chills Eyes: No visual changes. ENT: No sore throat. Cardiovascular: Denies chest pain. Respiratory: Denies shortness of breath. Gastrointestinal: No abdominal pain.  Positive for coffee-ground emesis.  No diarrhea.  No constipation. Genitourinary: Negative for dysuria. Musculoskeletal: Negative for back pain. Skin: Negative for rash. Neurological: Negative for headaches, focal weakness or numbness.   ____________________________________________   PHYSICAL EXAM:  VITAL SIGNS: ED Triage Vitals [03/09/18 0347]  Enc Vitals Group     BP      Pulse      Resp      Temp      Temp  src      SpO2      Weight 124 lb (56.2 kg)     Height 5\' 5"  (1.651 m)     Head Circumference      Peak Flow      Pain Score      Pain Loc      Pain Edu?      Excl. in GC?     Constitutional: Alert and oriented. Well appearing and in mild acute distress. Eyes: Conjunctivae are normal. PERRL. EOMI. Head: Atraumatic. Nose: No congestion/rhinnorhea. Mouth/Throat: Mucous membranes are moist.  Oropharynx non-erythematous. Neck: No stridor.   Cardiovascular: Normal rate, regular rhythm. Grossly normal heart sounds.  Good peripheral circulation. Respiratory: Normal respiratory effort.  No retractions. Lungs CTAB. Gastrointestinal: Soft and nontender to light or deep palpation. No distention. No abdominal bruits. No CVA tenderness. Musculoskeletal: No lower extremity tenderness nor edema.  No joint effusions. Neurologic:  Normal speech and language. No gross focal neurologic deficits are appreciated. No gait instability. Skin:  Skin is warm, dry and intact.  Vesicles in various stages of healing and crusting noted to right trunk consistent with shingles.   Psychiatric: Mood and affect are normal. Speech and behavior are  normal.  ____________________________________________   LABS (all labs ordered are listed, but only abnormal results are displayed)  Labs Reviewed  COMPREHENSIVE METABOLIC PANEL - Abnormal; Notable for the following components:      Result Value   Potassium 5.2 (*)    Glucose, Bld 231 (*)    Creatinine, Ser 1.29 (*)    AST 62 (*)    Alkaline Phosphatase 139 (*)    GFR calc non Af Amer 53 (*)    Anion gap 4 (*)    All other components within normal limits  CBC WITH DIFFERENTIAL/PLATELET - Abnormal; Notable for the following components:   HCT 38.1 (*)    RDW 16.1 (*)    Neutro Abs 7.2 (*)    Monocytes Absolute 1.2 (*)    All other components within normal limits  TROPONIN I - Abnormal; Notable for the following components:   Troponin I 0.03 (*)    All other components within normal limits  PROTIME-INR - Abnormal; Notable for the following components:   Prothrombin Time 15.9 (*)    All other components within normal limits  ETHANOL  LIPASE, BLOOD  POC OCCULT BLOOD, ED  TYPE AND SCREEN   ____________________________________________  EKG  ED ECG REPORT I, Chrisy Hillebrand J, the attending physician, personally viewed and interpreted this ECG.   Date: 03/09/2018  EKG Time: 0740  Rate: 107  Rhythm: Ventricular paced complexes  Axis: Paced  Intervals:Paced  ST&T Change: Paced  ____________________________________________  RADIOLOGY  ED MD interpretation:  No acute cardiopulmonary process  Official radiology report(s): Dg Chest Port 1 View  Result Date: 03/09/2018 CLINICAL DATA:  Black coffee ground emesis. EXAM: PORTABLE CHEST 1 VIEW COMPARISON:  02/28/2018 FINDINGS: Postoperative changes in the mediastinum. Cardiac pacemaker. Cardiac enlargement. Pulmonary vascularity is normal. No edema or consolidation. No blunting of costophrenic angles. No pneumothorax. Calcified and tortuous aorta. IMPRESSION: Cardiac enlargement. No evidence of active pulmonary disease. Aortic  atherosclerosis. Electronically Signed   By: Burman NievesWilliam  Stevens M.D.   On: 03/09/2018 04:10    ____________________________________________   PROCEDURES  Procedure(s) performed: None  Procedures  Critical Care performed: Yes, see critical care note(s)  CRITICAL CARE Performed by: Irean HongSUNG,Kejuan Bekker J   Total critical care time: 45 minutes  Critical care time was exclusive of  separately billable procedures and treating other patients.  Critical care was necessary to treat or prevent imminent or life-threatening deterioration.  Critical care was time spent personally by me on the following activities: development of treatment plan with patient and/or surrogate as well as nursing, discussions with consultants, evaluation of patient's response to treatment, examination of patient, obtaining history from patient or surrogate, ordering and performing treatments and interventions, ordering and review of laboratory studies, ordering and review of radiographic studies, pulse oximetry and re-evaluation of patient's condition. ____________________________________________   INITIAL IMPRESSION / ASSESSMENT AND PLAN / ED COURSE  As part of my medical decision making, I reviewed the following data within the electronic MEDICAL RECORD NUMBER Nursing notes reviewed and incorporated, Labs reviewed, EKG interpreted, Radiograph reviewed, Discussed with admitting physician and Notes from prior ED visits   72 year old male who presents with coffee-ground emesis. Differential diagnosis includes, but is not limited to, GERD, NSAID induced, gastritis, duodenitis, pancreatitis, small bowel or large bowel obstruction, abdominal aortic aneurysm, hernia, and ulcer(s), etc.  Noted rash consistent of shingles to patient's right trunk.  He states rash has been present for the past week.  Given the duration and length of rash, do not feel acyclovir nor prednisone will be helpful.  Will obtain screening lab work including type  and screen and coags.  Initiate IV fluid resuscitation.  Initiate Protonix bolus and drip.  Anticipate hospitalization.     ____________________________________________   FINAL CLINICAL IMPRESSION(S) / ED DIAGNOSES  Final diagnoses:  Herpes zoster without complication  UGIB (upper gastrointestinal bleed)  Coffee ground emesis  Elevated troponin     ED Discharge Orders    None       Note:  This document was prepared using Dragon voice recognition software and may include unintentional dictation errors.    Irean Hong, MD 03/09/18 [phone]

## 2018-03-09 NOTE — ED Notes (Signed)
PCXR completed.

## 2018-03-09 NOTE — Plan of Care (Signed)

## 2018-03-09 NOTE — ED Notes (Signed)
Dr Anne HahnWillis, hospitalist, in to see pt regarding admission

## 2018-03-09 NOTE — ED Notes (Addendum)
Pt uprite on stretcher in exam room with no distress noted; assisted into hosp gown & on card monitor; pt denies any c/o pain or noted bloody/dark stools but st has been having dark coffee ground emesis; resp even/unlab, crackles bilat, apical audible & regular; pt with pacemaker/defib in place; pt st "it will go off if I don't take my medicine"; +BS, abd soft/nondist/nontender; pt denies any c/o dizziness; pt with rash noted to right lower ribcage that radiates around side; pt st has been present x week with itching; pt placed on airborne precautions (tissue testing performed and WNL for exam room 1); wife & pt voices good understanding

## 2018-03-09 NOTE — Consult Note (Signed)
[name] [name], MD [facility]  [address] [place], [place] [address] Phone: [phone] Fax : [phone]  Consultation  Referring Provider:     Dr. Anne HahnWillis Primary Care Physician:  Lauro RegulusAnderson, Marshall W, MD Primary Gastroenterologist:  Gentry FitzUnassigned       Reason for Consultation:     Coffee ground emesis  Date of Admission:  03/09/2018 Date of Consultation:  03/09/2018         HPI:   [name] is a 72 y.o. male who is admitted with coffee-ground emesis.  The patient also states that he has been having coffee-ground emesis in the past. This time the patient reports that he had 4-5 episodes of coffee-ground emesis. The patient denies taking blood thinners at the present time but has recently been taking ibuprofen. It appears the patient had an upper endoscopy back in October 2014.  He also had an ERCP in December 2014. The ERCP appears to have been done due to pancreatitis. The patient was admitted with a hemoglobin of 13.3 which was up from his previous hemoglobin in October 2018 at 11.6 and in July of this year at around 12.  The patient's hemoglobin did drop this morning to 12.3 after hydration.  The patient denies any further nausea or vomiting.  He also denies any black stools. The patient denies any alcohol abuse.The patient was found to have elevated liver enzymes with his AST at 62 is ALT was normal at 31 with his alkaline phosphatase elevated at 139.  The patient's potassium was also increased at 5.2 today.  The patient was also put in isolation with a negative pressure room due to contact precautions for possible shingles.  Past Medical History:  Diagnosis Date  . AICD (automatic cardioverter/defibrillator) present 2010   battery changed out 2015  . Anginal pain (HCC)    infrequent pain. usually feels as pain in the arms  . Bilateral swelling of feet   . CHF (congestive heart failure) (HCC)   . Chronic cough   . Chronic kidney disease    renal insufficiency.started on  spironalactone  . COPD (chronic obstructive pulmonary disease) (HCC)    does not use o2  . Coronary artery disease   . Diabetes mellitus without complication (HCC)   . Dysrhythmia   . GERD (gastroesophageal reflux disease)   . Hepatitis   . Hypercholesteremia   . Hypertension    history of,   . Myocardial infarction (HCC) 1993   several. per patient, has had 10 heart attacks  . Pneumonia   . Presence of permanent cardiac pacemaker    and defibrilator    Past Surgical History:  Procedure Laterality Date  . CARDIAC CATHETERIZATION     with stents  . CARDIAC DEFIBRILLATOR PLACEMENT  2010  . CATARACT EXTRACTION W/PHACO Left 02/23/2016   Procedure: CATARACT EXTRACTION PHACO AND INTRAOCULAR LENS PLACEMENT (IOC);  Surgeon: Galen ManilaWilliam Porfilio, MD;  Location: [facility] ORS;  Service: Ophthalmology;  Laterality: Left;  [place] 1.05 AP% 22.9 CDE 15.02 Fluid pack lot # [id number]  . CHOLECYSTECTOMY  2014  . CORONARY ARTERY BYPASS GRAFT    . feeding tube removal  2014  . GASTROSTOMY W/ FEEDING TUBE  2014   after galbladder surgery.  . LOWER EXTREMITY ANGIOGRAPHY Left 04/26/2017   Procedure: Lower Extremity Angiography;  Surgeon: Annice Needyew, Jason S, MD;  Location: [facility] INVASIVE CV LAB;  Service: Cardiovascular;  Laterality: Left;  . LOWER EXTREMITY ANGIOGRAPHY Right 05/25/2017   Procedure: Lower Extremity Angiography;  Surgeon:  Annice Needy, MD;  Location: [facility] INVASIVE CV LAB;  Service: Cardiovascular;  Laterality: Right;  . PANCREATIC CYST EXCISION  2014   had hepatitis at this time  . STENT PLACEMENT NON-VASCULAR ([facility] HX)     cardiac stents x many    Prior to Admission medications   Medication Sig Start Date End Date Taking? Authorizing Provider  amiodarone (PACERONE) 400 MG tablet Take 1 tablet (400 mg total) by mouth daily. 02/15/18  Yes Enedina Finner, MD  aspirin EC 81 MG tablet Take 81 mg by mouth daily.   Yes [provider]  digoxin (LANOXIN) 0.125 MG tablet Take 0.125 mg by mouth daily.    Yes [provider]  furosemide (LASIX) 20 MG tablet Take 20 mg by mouth daily as needed for fluid.    Yes [provider]  guaiFENesin-dextromethorphan (ROBITUSSIN DM) 100-10 MG/5ML syrup Take 5 mLs by mouth every 4 (four) hours as needed for cough. 03/02/18  Yes Shaune Pollack, MD  insulin NPH Human (HUMULIN N,NOVOLIN N) 100 UNIT/ML injection Inject 0.18 mLs (18 Units total) into the skin 2 (two) times daily before a meal. Patient taking differently: Inject 15 Units into the skin 2 (two) times daily before a meal.  02/14/18  Yes Enedina Finner, MD  Melatonin 5 MG TABS Take 10 mg by mouth at bedtime.    Yes [provider]  metoprolol succinate (TOPROL-XL) 50 MG 24 hr tablet Take 50 mg by mouth 2 (two) times daily.   Yes [provider]  Multiple Vitamin (MULTIVITAMIN) capsule Take 1 capsule by mouth daily.   Yes [provider]  nitroGLYCERIN (NITRODUR - DOSED IN MG/24 HR) 0.1 mg/hr patch Place 1 patch (0.1 mg total) onto the skin daily as needed. Patient taking differently: Place 0.1 mg onto the skin daily as needed (chest pain).  02/14/18  Yes Enedina Finner, MD  nitroGLYCERIN (NITROSTAT) 0.4 MG SL tablet Place 0.4 mg under the tongue every 5 (five) minutes as needed for chest pain.   Yes [provider]  omeprazole (PRILOSEC) 40 MG capsule Take 40 mg by mouth daily.   Yes [provider]  pravastatin (PRAVACHOL) 40 MG tablet Take 40 mg by mouth at bedtime.   Yes [provider]  spironolactone (ALDACTONE) 25 MG tablet Take 25 mg by mouth every morning before breakfast. 05/17/16  Yes [provider]  triamcinolone cream (KENALOG) 0.5 % Apply 1 application topically 2 (two) times daily. 01/17/18  Yes [provider]  vitamin B-12 (CYANOCOBALAMIN) 1000 MCG tablet Take 1,000 mcg by mouth 2 (two) times daily.   Yes [provider]    Family History  Problem Relation Age of Onset  . Heart attack Mother   . Cancer  Brother   . CAD Brother      Social History   Tobacco Use  . Smoking status: Current Every Day Smoker    Packs/day: 1.00    Years: 55.00    Pack years: 55.00  . Smokeless tobacco: Never Used  Substance Use Topics  . Alcohol use: No    Comment: rare  . Drug use: No    Allergies as of 03/09/2018 - Review Complete 03/09/2018  Allergen Reaction Noted  . Other  02/17/2016  . Captopril Other (See Comments) 07/28/2017  . Adhesive [tape] Other (See Comments) 04/25/2017    Review of Systems:    All systems reviewed and negative except where noted in HPI.   Physical Exam:  Vital signs in  last 24 hours: Temp:  [97.7 F (36.5 C)-97.9 F (36.6 C)] 97.7 F (36.5 C) (07/26 1658) Pulse Rate:  [49-110] 70 (07/26 1658) Resp:  [16-27] 18 (07/26 1658) BP: (101-133)/(81-96) 124/82 (07/26 1658) SpO2:  [89 %-99 %] 99 % (07/26 1658) Weight:  [124 lb (56.2 kg)] 124 lb (56.2 kg) (07/26 0347) Last BM Date: 03/09/18 General:   Pleasant, cooperative in NAD Head:  Normocephalic and atraumatic. Eyes:   No icterus.   Conjunctiva pink. PERRLA. Ears:  Normal auditory acuity. Neck:  Supple; no masses or thyroidomegaly Lungs: Respirations even and unlabored. Lungs clear to auscultation bilaterally.   No wheezes, crackles, or rhonchi.  Heart:  Regular rate and rhythm;  Without murmur, clicks, rubs or gallops Abdomen:  Soft, nondistended, nontender. Normal bowel sounds. No appreciable masses or hepatomegaly.  No rebound or guarding.  Rectal:  Not performed. Msk:  Symmetrical without gross deformities.    Extremities:  Without edema, cyanosis or clubbing. Neurologic:  Alert and oriented x3;  grossly normal neurologically. Skin:  Intact without significant lesions or rashes. Cervical Nodes:  No significant cervical adenopathy. Psych:  Alert and cooperative. Normal affect.  LAB RESULTS: Recent Labs    03/09/18 0407 03/09/18 1010 03/09/18 1543  WBC 10.4  --   --   HGB 13.3 12.5* 12.3*  HCT  38.1*  --   --   PLT 213  --   --    BMET Recent Labs    03/09/18 0407  NA 137  K 5.2*  CL 106  CO2 27  GLUCOSE 231*  BUN 19  CREATININE 1.29*  CALCIUM 9.1   LFT Recent Labs    03/09/18 0407  PROT 7.1  ALBUMIN 3.6  AST 62*  ALT 31  ALKPHOS 139*  BILITOT 1.2   PT/INR Recent Labs    03/09/18 0407  LABPROT 15.9*  INR 1.28    STUDIES: Dg Chest Port 1 View  Result Date: 03/09/2018 CLINICAL DATA:  Black coffee ground emesis. EXAM: PORTABLE CHEST 1 VIEW COMPARISON:  02/28/2018 FINDINGS: Postoperative changes in the mediastinum. Cardiac pacemaker. Cardiac enlargement. Pulmonary vascularity is normal. No edema or consolidation. No blunting of costophrenic angles. No pneumothorax. Calcified and tortuous aorta. IMPRESSION: Cardiac enlargement. No evidence of active pulmonary disease. Aortic atherosclerosis. Electronically Signed   By: Burman Nieves M.D.   On: 03/09/2018 04:10      Impression / Plan:   Assessment: Principal Problem:   Coffee ground emesis Active Problems:   Hypertension   Diabetes mellitus without complication (HCC)   Hyperlipidemia   Shingles   [name] is a 72 y.o. y/o male with a history of hematemesis evaluated at [facility] a few years ago.  The patient reports that no source was found at that time.  The patient now was admitted with 4-5 episodes of hematemesis manifested by coffee ground emesis.  The patient's hemoglobin has been stable and he has not had any further signs of GI bleeding.  Plan:  I would recommend continued following of the patient at this time and treat him with a PPI.  It appears that he is already getting Protonix.  I recommend the patient holding off on any endoscopic procedures that this time due to his outbreak of shingles and no sign of any active bleeding at this time.  If he should start to drop his hemoglobin or he should have hematemesis again or coffee ground emesis then we can reconsider an upper endoscopy on this  patient.  The  patient has been explained the plan and agrees with it.  Thank you for involving me in the care of this patient.      LOS: 0 days   Midge Minium, MD  03/09/2018, 9:03 PM    Note: This dictation was prepared with Dragon dictation along with smaller phrase technology. Any transcriptional errors that result from this process are unintentional.

## 2018-03-09 NOTE — ED Notes (Signed)
Pt assisted to the toilet to void. Gait steady. Pt reports relief, denies pain at this time,no distress noted, cont to monitor

## 2018-03-09 NOTE — ED Triage Notes (Signed)
EMS pt from home black coffee grounds emesis.

## 2018-03-09 NOTE — ED Notes (Signed)
Pt ambulatory to bathroom without difficulty noted, accomp by wife; pt had small brown formed stool

## 2018-03-09 NOTE — H&P (Signed)
[facility] [place] at [facility]   PATIENT NAME: [name]    MR#:  [id number]  DATE OF BIRTH:  02/16/1946  DATE OF ADMISSION:  03/09/2018  PRIMARY CARE PHYSICIAN: Lauro Regulus, MD   REQUESTING/REFERRING PHYSICIAN: Dolores Frame, MD  CHIEF COMPLAINT:   Chief Complaint  Patient presents with  . Emesis    HISTORY OF PRESENT ILLNESS:  Mico Spark  is a 72 y.o. male who presents with coffee-ground emesis.  Patient states that this happened before, but then got better.  He states that it started again over the last 2 days.  Here in the ED his hemoglobin is stable.  Hospitalist were called for admission for further evaluation.  Of note, he was also noted to have a rash seemingly following dermatome on his right chest which looks somewhat like shingles.  PAST MEDICAL HISTORY:   Past Medical History:  Diagnosis Date  . AICD (automatic cardioverter/defibrillator) present 2010   battery changed out 2015  . Anginal pain (HCC)    infrequent pain. usually feels as pain in the arms  . Bilateral swelling of feet   . CHF (congestive heart failure) (HCC)   . Chronic cough   . Chronic kidney disease    renal insufficiency.started on spironalactone  . COPD (chronic obstructive pulmonary disease) (HCC)    does not use o2  . Coronary artery disease   . Diabetes mellitus without complication (HCC)   . Dysrhythmia   . GERD (gastroesophageal reflux disease)   . Hepatitis   . Hypercholesteremia   . Hypertension    history of,   . Myocardial infarction (HCC) 1993   several. per patient, has had 10 heart attacks  . Pneumonia   . Presence of permanent cardiac pacemaker    and defibrilator     PAST SURGICAL HISTORY:   Past Surgical History:  Procedure Laterality Date  . CARDIAC CATHETERIZATION     with stents  . CARDIAC DEFIBRILLATOR PLACEMENT  2010  . CATARACT EXTRACTION W/PHACO Left 02/23/2016   Procedure: CATARACT EXTRACTION PHACO AND INTRAOCULAR LENS  PLACEMENT (IOC);  Surgeon: Galen Manila, MD;  Location: [facility] ORS;  Service: Ophthalmology;  Laterality: Left;  [place] 1.05 AP% 22.9 CDE 15.02 Fluid pack lot # [id number]  . CHOLECYSTECTOMY  2014  . CORONARY ARTERY BYPASS GRAFT    . feeding tube removal  2014  . GASTROSTOMY W/ FEEDING TUBE  2014   after galbladder surgery.  . LOWER EXTREMITY ANGIOGRAPHY Left 04/26/2017   Procedure: Lower Extremity Angiography;  Surgeon: Annice Needy, MD;  Location: [facility] INVASIVE CV LAB;  Service: Cardiovascular;  Laterality: Left;  . LOWER EXTREMITY ANGIOGRAPHY Right 05/25/2017   Procedure: Lower Extremity Angiography;  Surgeon: Annice Needy, MD;  Location: [facility] INVASIVE CV LAB;  Service: Cardiovascular;  Laterality: Right;  . PANCREATIC CYST EXCISION  2014   had hepatitis at this time  . STENT PLACEMENT NON-VASCULAR ([facility] HX)     cardiac stents x many     SOCIAL HISTORY:   Social History   Tobacco Use  . Smoking status: Current Every Day Smoker    Packs/day: 1.00    Years: 55.00    Pack years: 55.00  . Smokeless tobacco: Never Used  Substance Use Topics  . Alcohol use: No    Comment: rare     FAMILY HISTORY:   Family History  Problem Relation Age of Onset  . Heart attack Mother   . Cancer Brother   . CAD  Brother      DRUG ALLERGIES:   Allergies  Allergen Reactions  . Other     Intolerant to blood thinners - pt states that he has had "internal bleeding" while on blood thinners  . Adhesive [Tape] Other (See Comments)    "PULLS SKIN OFF" PER PT REPORT. OK TO USE PAPER TAPE.    MEDICATIONS AT HOME:   Prior to Admission medications   Medication Sig Start Date End Date Taking? Authorizing Provider  amiodarone (PACERONE) 400 MG tablet Take 1 tablet (400 mg total) by mouth daily. 02/15/18   Enedina Finner, MD  aspirin EC 81 MG tablet Take 81 mg by mouth daily.    [provider]  digoxin (LANOXIN) 0.125 MG tablet Take 0.125 mg by mouth daily.    [provider]   furosemide (LASIX) 20 MG tablet Take 20 mg by mouth daily as needed for fluid.     [provider]  guaiFENesin-dextromethorphan (ROBITUSSIN DM) 100-10 MG/5ML syrup Take 5 mLs by mouth every 4 (four) hours as needed for cough. 03/02/18   Shaune Pollack, MD  insulin NPH Human (HUMULIN N,NOVOLIN N) 100 UNIT/ML injection Inject 0.18 mLs (18 Units total) into the skin 2 (two) times daily before a meal. Patient taking differently: Inject 15 Units into the skin 2 (two) times daily before a meal.  02/14/18   Enedina Finner, MD  levofloxacin (LEVAQUIN) 750 MG tablet Take 1 tablet (750 mg total) by mouth daily. 03/02/18   Shaune Pollack, MD  Melatonin 5 MG TABS Take 10 mg by mouth at bedtime.     [provider]  metoprolol succinate (TOPROL-XL) 50 MG 24 hr tablet Take 50 mg by mouth 2 (two) times daily.    [provider]  Multiple Vitamin (MULTIVITAMIN) capsule Take 1 capsule by mouth daily.    [provider]  nitroGLYCERIN (NITRODUR - DOSED IN MG/24 HR) 0.1 mg/hr patch Place 1 patch (0.1 mg total) onto the skin daily as needed. Patient taking differently: Place 0.1 mg onto the skin daily as needed (chest pain).  02/14/18   Enedina Finner, MD  nitroGLYCERIN (NITROSTAT) 0.4 MG SL tablet Place 0.4 mg under the tongue every 5 (five) minutes as needed for chest pain.    [provider]  omeprazole (PRILOSEC) 40 MG capsule Take 40 mg by mouth daily.    [provider]  pravastatin (PRAVACHOL) 40 MG tablet Take 40 mg by mouth at bedtime.    [provider]  spironolactone (ALDACTONE) 25 MG tablet Take 25 mg by mouth every morning before breakfast. 05/17/16   [provider]  triamcinolone cream (KENALOG) 0.5 % Apply 1 application topically 2 (two) times daily. 01/17/18   [provider]  vitamin B-12 (CYANOCOBALAMIN) 1000 MCG tablet Take 1,000 mcg by mouth 2 (two) times daily.    [provider]    REVIEW OF SYSTEMS:  Review of Systems   Constitutional: Negative for chills, fever, malaise/fatigue and weight loss.  HENT: Negative for ear pain, hearing loss and tinnitus.   Eyes: Negative for blurred vision, double vision, pain and redness.  Respiratory: Negative for cough, hemoptysis and shortness of breath.   Cardiovascular: Negative for chest pain, palpitations, orthopnea and leg swelling.  Gastrointestinal: Negative for abdominal pain, constipation, diarrhea, nausea and vomiting.       Coffee-ground emesis  Genitourinary: Negative for dysuria, frequency and hematuria.  Musculoskeletal: Negative for back pain, joint pain and neck pain.  Skin: Positive for rash (Right  chest dermatomal rash).       No acne or lesions  Neurological: Negative for dizziness, tremors, focal weakness and weakness.  Endo/Heme/Allergies: Negative for polydipsia. Does not bruise/bleed easily.  Psychiatric/Behavioral: Negative for depression. The patient is not nervous/anxious and does not have insomnia.      VITAL SIGNS:   Vitals:   03/09/18 0347 03/09/18 0414  Resp:  18  Temp:  97.9 F (36.6 C)  TempSrc:  Oral  SpO2:  98%  Weight: 56.2 kg (124 lb)   Height: 5\' 5"  (1.651 m)    Wt Readings from Last 3 Encounters:  03/09/18 56.2 kg (124 lb)  03/02/18 56.6 kg (124 lb 11.2 oz)  02/14/18 59.1 kg (130 lb 4.8 oz)    PHYSICAL EXAMINATION:  Physical Exam  Vitals reviewed. Constitutional: He is oriented to person, place, and time. He appears well-developed and well-nourished. No distress.  HENT:  Head: Normocephalic and atraumatic.  Mouth/Throat: Oropharynx is clear and moist.  Eyes: Pupils are equal, round, and reactive to light. Conjunctivae and EOM are normal. No scleral icterus.  Neck: Normal range of motion. Neck supple. No JVD present. No thyromegaly present.  Cardiovascular: Normal rate, regular rhythm and intact distal pulses. Exam reveals no gallop and no friction rub.  No murmur heard. Respiratory: Effort normal and breath sounds  normal. No respiratory distress. He has no wheezes. He has no rales.  GI: Soft. Bowel sounds are normal. He exhibits no distension. There is no tenderness.  Musculoskeletal: Normal range of motion. He exhibits no edema.  No arthritis, no gout  Lymphadenopathy:    He has no cervical adenopathy.  Neurological: He is alert and oriented to person, place, and time. No cranial nerve deficit.  No dysarthria, no aphasia  Skin: Skin is warm and dry. Rash (Right chest, dermatomal in location, dried vesicular appearance) noted. No erythema.  Psychiatric: He has a normal mood and affect. His behavior is normal. Judgment and thought content normal.    LABORATORY PANEL:   CBC Recent Labs  Lab 03/09/18 0407  WBC 10.4  HGB 13.3  HCT 38.1*  PLT 213   ------------------------------------------------------------------------------------------------------------------  Chemistries  Recent Labs  Lab 03/09/18 0407  NA 137  K 5.2*  CL 106  CO2 27  GLUCOSE 231*  BUN 19  CREATININE 1.29*  CALCIUM 9.1  AST 62*  ALT 31  ALKPHOS 139*  BILITOT 1.2   ------------------------------------------------------------------------------------------------------------------  Cardiac Enzymes Recent Labs  Lab 03/09/18 0407  TROPONINI 0.03*   ------------------------------------------------------------------------------------------------------------------  RADIOLOGY:  Dg Chest Port 1 View  Result Date: 03/09/2018 CLINICAL DATA:  Black coffee ground emesis. EXAM: PORTABLE CHEST 1 VIEW COMPARISON:  02/28/2018 FINDINGS: Postoperative changes in the mediastinum. Cardiac pacemaker. Cardiac enlargement. Pulmonary vascularity is normal. No edema or consolidation. No blunting of costophrenic angles. No pneumothorax. Calcified and tortuous aorta. IMPRESSION: Cardiac enlargement. No evidence of active pulmonary disease. Aortic atherosclerosis. Electronically Signed   By: Burman NievesWilliam  Stevens M.D.   On: 03/09/2018 04:10     EKG:   Orders placed or performed during the hospital encounter of 03/09/18  . ED EKG  . ED EKG    IMPRESSION AND PLAN:  Principal Problem:   Coffee ground emesis -PPI drip, GI consult, serial hemoglobin Active Problems:   Shingles -we will treat with acyclovir   Hypertension -continue home meds   Diabetes mellitus without complication (HCC) -sliding scale insulin with corresponding glucose checks   Hyperlipidemia -Home meds  Chart review performed and case discussed with ED  provider. Labs, imaging and/or ECG reviewed by provider and discussed with patient/family. Management plans discussed with the patient and/or family.  DVT PROPHYLAXIS: Mechanical only  GI PROPHYLAXIS: PPI  ADMISSION STATUS: Inpatient  CODE STATUS: Full Code Status History    Date Active Date Inactive Code Status Order ID Comments User Context   03/01/2018 0050 03/02/2018 1811 Full Code [id number]  Cammy Copa, MD Inpatient   02/13/2018 0329 02/14/2018 2152 Full Code [id number]  Arnaldo Natal, MD Inpatient   05/25/2017 1044 05/25/2017 1428 Full Code [id number]  Annice Needy, MD Inpatient   04/26/2017 1417 04/26/2017 1910 Full Code [id number]  Annice Needy, MD Inpatient      TOTAL TIME TAKING CARE OF THIS PATIENT: 45 minutes.   Rodricus Candelaria, [place] FIELDING 03/09/2018, 5:48 AM  [facility]  Office  [phone]  CC: Primary care physician; Lauro Regulus, MD  Note:  This document was prepared using Dragon voice recognition software and may include unintentional dictation errors.

## 2018-03-10 DIAGNOSIS — K920 Hematemesis: Secondary | ICD-10-CM | POA: Diagnosis not present

## 2018-03-10 LAB — CBC
HCT: 36.4 % — ABNORMAL LOW (ref 40.0–52.0)
Hemoglobin: 12.2 g/dL — ABNORMAL LOW (ref 13.0–18.0)
MCH: 27.9 pg (ref 26.0–34.0)
MCHC: 33.6 g/dL (ref 32.0–36.0)
MCV: 83.1 fL (ref 80.0–100.0)
PLATELETS: 195 10*3/uL (ref 150–440)
RBC: 4.38 MIL/uL — AB (ref 4.40–5.90)
RDW: 16.2 % — ABNORMAL HIGH (ref 11.5–14.5)
WBC: 8.8 10*3/uL (ref 3.8–10.6)

## 2018-03-10 LAB — HEMOGLOBIN A1C
HEMOGLOBIN A1C: 11.9 % — AB (ref 4.8–5.6)
Mean Plasma Glucose: 294.83 mg/dL

## 2018-03-10 LAB — GLUCOSE, CAPILLARY
GLUCOSE-CAPILLARY: 97 mg/dL (ref 70–99)
GLUCOSE-CAPILLARY: 211 mg/dL — AB (ref 70–99)
GLUCOSE-CAPILLARY: 233 mg/dL — AB (ref 70–99)
Glucose-Capillary: 123 mg/dL — ABNORMAL HIGH (ref 70–99)
Glucose-Capillary: 229 mg/dL — ABNORMAL HIGH (ref 70–99)

## 2018-03-10 LAB — BASIC METABOLIC PANEL
ANION GAP: 6 (ref 5–15)
BUN: 18 mg/dL (ref 8–23)
CALCIUM: 8.9 mg/dL (ref 8.9–10.3)
CO2: 26 mmol/L (ref 22–32)
CREATININE: 1.2 mg/dL (ref 0.61–1.24)
Chloride: 108 mmol/L (ref 98–111)
GFR, EST NON AFRICAN AMERICAN: 58 mL/min — AB (ref >60)
Glucose, Bld: 95 mg/dL (ref 70–99)
Potassium: 4.3 mmol/L (ref 3.5–5.1)
SODIUM: 140 mmol/L (ref 135–145)

## 2018-03-10 LAB — HEMOGLOBIN: HEMOGLOBIN: 13.2 g/dL (ref 13.0–18.0)

## 2018-03-10 MED ORDER — INSULIN ASPART 100 UNIT/ML ~~LOC~~ SOLN
SUBCUTANEOUS | Status: AC
  Filled 2018-03-10: qty 1

## 2018-03-10 MED ORDER — INSULIN ASPART 100 UNIT/ML ~~LOC~~ SOLN
0.0000 [IU] | Freq: Three times a day (TID) | SUBCUTANEOUS | Status: DC
  Administered 2018-03-10 (×3): 3 [IU] via SUBCUTANEOUS
  Administered 2018-03-11: 2 [IU] via SUBCUTANEOUS
  Filled 2018-03-10 (×3): qty 1

## 2018-03-10 NOTE — Progress Notes (Signed)
[facility] - [place] at [facility]   PATIENT NAME: [name]    MR#:  [id number]  DATE OF BIRTH:  1945-12-01  SUBJECTIVE:  CHIEF COMPLAINT:   Chief Complaint  Patient presents with  . 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 No fatigue, weakness. No weight gain, no weight loss.  EYES: No blurry or double vision.  ENT: No tinnitus. No postnasal drip. No redness of the oropharynx.  RESPIRATORY: No cough, no wheeze, no hemoptysis. No dyspnea.  CARDIOVASCULAR: No chest pain. No orthopnea. No palpitations. No syncope.  GASTROINTESTINAL: No nausea, no vomiting or diarrhea. No abdominal pain. No melena or hematochezia.  GENITOURINARY: No dysuria or hematuria.  ENDOCRINE: No polyuria or nocturia. No heat or cold intolerance.  HEMATOLOGY: No anemia. No bruising. No bleeding.  INTEGUMENTARY : Rash over chest wall MUSCULOSKELETAL: No arthritis. No swelling. No gout.  NEUROLOGIC: No numbness, tingling, or ataxia. No seizure-type activity.  PSYCHIATRIC: No anxiety. No insomnia. No ADD.   DRUG ALLERGIES:   Allergies  Allergen Reactions  . Other     Intolerant to blood thinners - pt states that he has had "internal bleeding" while on blood thinners  . Captopril Other (See Comments)  . Adhesive [Tape] Other (See Comments)    "PULLS SKIN OFF" PER PT REPORT. OK TO USE PAPER TAPE.    VITALS:  Blood pressure 113/70, pulse 71, temperature 97.8 F (36.6 C), temperature source Oral, resp. rate 18, height 5\' 5"  (1.651 m), weight 56.2 kg (124 lb), SpO2 95 %.  PHYSICAL EXAMINATION:   Physical Exam  GENERAL:  72 y.o.-year-old patient lying in the bed with no acute distress.  EYES: Pupils equal, round, reactive to light and accommodation. No scleral icterus. Extraocular muscles  intact.  HEENT: Head atraumatic, normocephalic. Oropharynx and nasopharynx clear.  NECK:  Supple, no jugular venous distention. No thyroid enlargement, no tenderness.  LUNGS: Normal breath sounds bilaterally, no wheezing, rales, rhonchi. No use of accessory muscles of respiration.  CARDIOVASCULAR: S1, S2 normal. No murmurs, rubs, or gallops.  ABDOMEN: Soft, nontender, nondistended. Bowel sounds present. No organomegaly or mass.  EXTREMITIES: No cyanosis, clubbing or edema b/l.    NEUROLOGIC: Cranial nerves II through XII are intact. No focal Motor or sensory deficits b/l.   PSYCHIATRIC: The patient is alert and oriented x 3.  SKIN: Rash right chest Vesicles noted  LABORATORY PANEL:   CBC Recent Labs  Lab 03/10/18 0410 03/10/18 0946  WBC 8.8  --   HGB 12.2* 13.2  HCT 36.4*  --   PLT 195  --    ------------------------------------------------------------------------------------------------------------------ Chemistries  Recent Labs  Lab 03/09/18 0407 03/10/18 0410  NA 137 140  K 5.2* 4.3  CL 106 108  CO2 27 26  GLUCOSE 231* 95  BUN 19 18  CREATININE 1.29* 1.20  CALCIUM 9.1 8.9  AST 62*  --   ALT 31  --   ALKPHOS 139*  --   BILITOT 1.2  --    ------------------------------------------------------------------------------------------------------------------  Cardiac Enzymes Recent Labs  Lab 03/09/18 0407  TROPONINI 0.03*   ------------------------------------------------------------------------------------------------------------------  RADIOLOGY:  Dg Chest Port 1 View  Result Date: 03/09/2018 CLINICAL DATA:  Black coffee ground emesis. EXAM: PORTABLE CHEST 1 VIEW COMPARISON:  02/28/2018 FINDINGS: Postoperative changes in the mediastinum.  Cardiac pacemaker. Cardiac enlargement. Pulmonary vascularity is normal. No edema or consolidation. No blunting of costophrenic angles. No pneumothorax. Calcified and tortuous aorta. IMPRESSION: Cardiac enlargement. No evidence of  active pulmonary disease. Aortic atherosclerosis. Electronically Signed   By: Burman NievesWilliam  Stevens M.D.   On: 03/09/2018 04:10     ASSESSMENT AND PLAN:   72 year old male patient with history of hypertension, diabetes mellitus type 2, hyperlipidemia currently under hospitalist service for GI bleed and shingles  -Acute gastrointestinal bleeding Monitor hemoglobin and hematocrit On proton pump inhibitor EGD and colonoscopy for now deferred by gastroenterology  -Shingles Airborne isolation Oral acyclovir to continue  -Type 2 diabetes mellitus Sliding scale coverage with insulin  -DVT prophylaxis sequential compression device to lower extremities   All the records are reviewed and case discussed with Care Management/Social Worker. Management plans discussed with the patient, family and they are in agreement.  CODE STATUS: Full code  DVT Prophylaxis: SCDs  TOTAL TIME TAKING CARE OF THIS PATIENT: 35 minutes.   POSSIBLE D/C IN 2 to 3 DAYS, DEPENDING ON CLINICAL CONDITION.  Ihor AustinPavan Pyreddy M.D on 03/10/2018 at 1:35 PM  Between 7am to 6pm - Pager - [phone]  After 6pm go to [email or web address] - password [email or web address] [facility]  [facility]  Office  [phone]  CC: Primary care physician; Lauro RegulusAnderson, Marshall W, MD  Note: This dictation was prepared with Dragon dictation along with smaller phrase technology. Any transcriptional errors that result from this process are unintentional.

## 2018-03-10 NOTE — Progress Notes (Signed)
Luis Repress, MD [address]  [address]  [place], [place] [address]  Main: [phone]  Fax: [phone] Pager: [phone]   Subjective: Denies further episodes of coffee-ground emesis. He is eating regular food and tolerating it well. Hemoglobin has been stable.  Objective: Vital signs in last 24 hours: Vitals:   03/09/18 0734 03/09/18 0845 03/09/18 1658 03/10/18 0719  BP: 130/83 (!) 126/95 124/82 113/70  Pulse:  (!) 49 70 71  Resp: 16  18   Temp:  97.9 F (36.6 C) 97.7 F (36.5 C) 97.8 F (36.6 C)  TempSrc:  Oral Oral Oral  SpO2:  98% 99% 95%  Weight:      Height:       Weight change:   Intake/Output Summary (Last 24 hours) at 03/10/2018 1354 Last data filed at 03/10/2018 1010 Gross per 24 hour  Intake -  Output 1800 ml  Net -1800 ml     Exam: Heart:: Regular rate and rhythm or S1S2 present Lungs: clear to auscultation Abdomen: soft, nontender, normal bowel sounds   Lab Results: CBC Latest Ref Rng & Units 03/10/2018 03/10/2018 03/09/2018  WBC 3.8 - 10.6 K/uL - 8.8 -  Hemoglobin 13.0 - 18.0 g/dL 96.2 12.2(L) 12.5(L)  Hematocrit 40.0 - 52.0 % - 36.4(L) -  Platelets 150 - 440 K/uL - 195 -   CMP Latest Ref Rng & Units 03/10/2018 03/09/2018 03/01/2018  Glucose 70 - 99 mg/dL 95 952(W) 413(K)  BUN 8 - 23 mg/dL 18 19 18   Creatinine 0.61 - 1.24 mg/dL 4.40 1.02(V) 2.53  Sodium 135 - 145 mmol/L 140 137 136  Potassium 3.5 - 5.1 mmol/L 4.3 5.2(H) 4.0  Chloride 98 - 111 mmol/L 108 106 109  CO2 22 - 32 mmol/L 26 27 23   Calcium 8.9 - 10.3 mg/dL 8.9 9.1 6.6(Y)  Total Protein 6.5 - 8.1 g/dL - 7.1 -  Total Bilirubin 0.3 - 1.2 mg/dL - 1.2 -  Alkaline Phos 38 - 126 U/L - 139(H) -  AST 15 - 41 U/L - 62(H) -  ALT 0 - 44 U/L - 31 -   Micro Results: No results found for this or any previous visit (from the past 240 hour(s)). Studies/Results: Dg Chest Port 1 View  Result Date: 03/09/2018 CLINICAL DATA:  Black coffee ground emesis. EXAM: PORTABLE CHEST 1 VIEW  COMPARISON:  02/28/2018 FINDINGS: Postoperative changes in the mediastinum. Cardiac pacemaker. Cardiac enlargement. Pulmonary vascularity is normal. No edema or consolidation. No blunting of costophrenic angles. No pneumothorax. Calcified and tortuous aorta. IMPRESSION: Cardiac enlargement. No evidence of active pulmonary disease. Aortic atherosclerosis. Electronically Signed   By: Burman Nieves M.D.   On: 03/09/2018 04:10   Medications: I have reviewed the patient's current medications. Scheduled Meds: . acyclovir  800 mg Oral 5 X Daily  . amiodarone  400 mg Oral Daily  . digoxin  0.125 mg Oral Daily  . insulin aspart  0-9 Units Subcutaneous TID AC & HS  . metoprolol succinate  50 mg Oral BID  . [START ON 03/12/2018] pantoprazole  40 mg Intravenous Q12H  . pravastatin  40 mg Oral QHS   Continuous Infusions: PRN Meds:.acetaminophen **OR** acetaminophen, ondansetron **OR** ondansetron (ZOFRAN) IV   Assessment: Principal Problem:   Coffee ground emesis Active Problems:   Hypertension   Diabetes mellitus without complication (HCC)   Hyperlipidemia   Shingles   UGIB (upper gastrointestinal bleed)  Coffee-ground emesis resolved, hemoglobin is normal  Plan: Continue Protonix 40 mg twice daily  Follow up with GI as outpatient in 2-4 weeks Can be discharged home today from GI standpoint   LOS: 1 day   Rohini Vanga 03/10/2018, 1:54 PM

## 2018-03-11 DIAGNOSIS — K920 Hematemesis: Secondary | ICD-10-CM | POA: Diagnosis not present

## 2018-03-11 LAB — CBC
HCT: 36.8 % — ABNORMAL LOW (ref 40.0–52.0)
Hemoglobin: 12.3 g/dL — ABNORMAL LOW (ref 13.0–18.0)
MCH: 27.6 pg (ref 26.0–34.0)
MCHC: 33.4 g/dL (ref 32.0–36.0)
MCV: 82.6 fL (ref 80.0–100.0)
PLATELETS: 202 10*3/uL (ref 150–440)
RBC: 4.45 MIL/uL (ref 4.40–5.90)
RDW: 16.2 % — AB (ref 11.5–14.5)
WBC: 8.4 10*3/uL (ref 3.8–10.6)

## 2018-03-11 LAB — GLUCOSE, CAPILLARY: GLUCOSE-CAPILLARY: 181 mg/dL — AB (ref 70–99)

## 2018-03-11 MED ORDER — PANTOPRAZOLE SODIUM 40 MG PO TBEC: 40.0000 mg | DELAYED_RELEASE_TABLET | Freq: Every day | ORAL | 0 refills | Status: DC

## 2018-03-11 MED ORDER — VALACYCLOVIR HCL 1 G PO TABS: 1000.0000 mg | ORAL_TABLET | Freq: Three times a day (TID) | ORAL | 0 refills | Status: AC

## 2018-03-11 NOTE — Discharge Summary (Signed)
[employer] - [place] [facility]   PATIENT NAME: [name]    MR#:  [id number]  DATE OF BIRTH:  11/15/1945  DATE OF ADMISSION:  03/09/2018 ADMITTING PHYSICIAN: Oralia Manisavid Willis, MD  DATE OF DISCHARGE: 03/11/2018  PRIMARY CARE PHYSICIAN: Lauro RegulusAnderson, Marshall W, MD   ADMISSION DIAGNOSIS:  Elevated troponin [R74.8] Coffee ground emesis [K92.0] UGIB (upper gastrointestinal bleed) [K92.2] Herpes zoster without complication [B02.9]  DISCHARGE DIAGNOSIS:  Upper Gastro Intestinal Bleeding Herpes zoster with out complication Elevated troponin secondary to demand ischemia Diabetes mellitus type II  SECONDARY DIAGNOSIS:   Past Medical History:  Diagnosis Date  . AICD (automatic cardioverter/defibrillator) present 2010   battery changed out 2015  . Anginal pain (HCC)    infrequent pain. usually feels as pain in the arms  . Bilateral swelling of feet   . CHF (congestive heart failure) (HCC)   . Chronic cough   . Chronic kidney disease    renal insufficiency.started on spironalactone  . COPD (chronic obstructive pulmonary disease) (HCC)    does not use o2  . Coronary artery disease   . Diabetes mellitus without complication (HCC)   . Dysrhythmia   . GERD (gastroesophageal reflux disease)   . Hepatitis   . Hypercholesteremia   . Hypertension    history of,   . Myocardial infarction (HCC) 1993   several. per patient, has had 10 heart attacks  . Pneumonia   . Presence of permanent cardiac pacemaker    and defibrilator     ADMITTING HISTORY [name]  is a 72 y.o. male who presents with coffee-ground emesis.  Patient states that this happened before, but then got better.  He states that it started again over the last 2 days.  Here in the ED his hemoglobin is stable.  Hospitalist were called for admission for further evaluation.  Of note, he was also noted to have a rash seemingly following dermatome on his right chest which looks somewhat like shingles  HOSPITAL  COURSE:  Patient admitted to medical floor with airborne and contact isolation. Patient was put on IV protonix drip and received IV acyclovir. Patient seen by gastroenterology but endoscopy deferred secondary to outbreak of shingles with no new episodes of bleed.Hemoglobin stable with no drop. Protonix changed to oral route.Patient tolerated IV acyclovir well. Patient will be discharged home with oral valtrax and f/u with GI in clinic.  CONSULTS OBTAINED:  Gastroenterology  DRUG ALLERGIES:   Allergies  Allergen Reactions  . Other     Intolerant to blood thinners - pt states that he has had "internal bleeding" while on blood thinners  . Captopril Other (See Comments)  . Adhesive [Tape] Other (See Comments)    "PULLS SKIN OFF" PER PT REPORT. OK TO USE PAPER TAPE.    DISCHARGE MEDICATIONS:   Allergies as of 03/11/2018      Reactions   Other    Intolerant to blood thinners - pt states that he has had "internal bleeding" while on blood thinners   Captopril Other (See Comments)   Adhesive [tape] Other (See Comments)   "PULLS SKIN OFF" PER PT REPORT. OK TO USE PAPER TAPE.      Medication List    TAKE these medications   amiodarone 400 MG tablet Commonly known as:  PACERONE Take 1 tablet (400 mg total) by mouth daily.   aspirin EC 81 MG tablet Take 81 mg by mouth daily.   digoxin 0.125 MG tablet Commonly known as:  LANOXIN Take  0.125 mg by mouth daily.   furosemide 20 MG tablet Commonly known as:  LASIX Take 20 mg by mouth daily as needed for fluid.   guaiFENesin-dextromethorphan 100-10 MG/5ML syrup Commonly known as:  ROBITUSSIN DM Take 5 mLs by mouth every 4 (four) hours as needed for cough.   insulin NPH Human 100 UNIT/ML injection Commonly known as:  HUMULIN N,NOVOLIN N Inject 0.18 mLs (18 Units total) into the skin 2 (two) times daily before a meal. What changed:  how much to take   Melatonin 5 MG Tabs Take 10 mg by mouth at bedtime.   metoprolol succinate 50  MG 24 hr tablet Commonly known as:  TOPROL-XL Take 50 mg by mouth 2 (two) times daily.   multivitamin capsule Take 1 capsule by mouth daily.   nitroGLYCERIN 0.4 MG SL tablet Commonly known as:  NITROSTAT Place 0.4 mg under the tongue every 5 (five) minutes as needed for chest pain. What changed:  Another medication with the same name was changed. Make sure you understand how and when to take each.   nitroGLYCERIN 0.1 mg/hr patch Commonly known as:  NITRODUR - Dosed in mg/24 hr Place 1 patch (0.1 mg total) onto the skin daily as needed. What changed:  reasons to take this   omeprazole 40 MG capsule Commonly known as:  PRILOSEC Take 40 mg by mouth daily.   pantoprazole 40 MG tablet Commonly known as:  PROTONIX Take 1 tablet (40 mg total) by mouth daily.   pravastatin 40 MG tablet Commonly known as:  PRAVACHOL Take 40 mg by mouth at bedtime.   spironolactone 25 MG tablet Commonly known as:  ALDACTONE Take 25 mg by mouth every morning before breakfast.   triamcinolone cream 0.5 % Commonly known as:  KENALOG Apply 1 application topically 2 (two) times daily.   valACYclovir 1000 MG tablet Commonly known as:  VALTREX Take 1 tablet (1,000 mg total) by mouth 3 (three) times daily for 7 days.   vitamin B-12 1000 MCG tablet Commonly known as:  CYANOCOBALAMIN Take 1,000 mcg by mouth 2 (two) times daily.       Today  Patient seen on day of discharge No new episodes of bleed Tolerating diet well  VITAL SIGNS:  Blood pressure 116/76, pulse 69, temperature 97.8 F (36.6 C), temperature source Oral, resp. rate 18, height 5\' 5"  (1.651 m), weight 56.2 kg (124 lb), SpO2 95 %.  I/O:    Intake/Output Summary (Last 24 hours) at 03/11/2018 1122 Last data filed at 03/11/2018 0249 Gross per 24 hour  Intake 240 ml  Output 1420 ml  Net -1180 ml    PHYSICAL EXAMINATION:  Physical Exam  GENERAL:  72 y.o.-year-old patient lying in the bed with no acute distress.  LUNGS: Normal  breath sounds bilaterally, no wheezing, rales,rhonchi or crepitation. No use of accessory muscles of respiration.  CARDIOVASCULAR: S1, S2 normal. No murmurs, rubs, or gallops.  ABDOMEN: Soft, non-tender, non-distended. Bowel sounds present. No organomegaly or mass.  NEUROLOGIC: Moves all 4 extremities. PSYCHIATRIC: The patient is alert and oriented x 3.  SKIN: rash over right chest wall dermatome  DATA REVIEW:   CBC Recent Labs  Lab 03/11/18 0554  WBC 8.4  HGB 12.3*  HCT 36.8*  PLT 202    Chemistries  Recent Labs  Lab 03/09/18 0407 03/10/18 0410  NA 137 140  K 5.2* 4.3  CL 106 108  CO2 27 26  GLUCOSE 231* 95  BUN 19 18  CREATININE 1.29*  1.20  CALCIUM 9.1 8.9  AST 62*  --   ALT 31  --   ALKPHOS 139*  --   BILITOT 1.2  --     Cardiac Enzymes Recent Labs  Lab 03/09/18 0407  TROPONINI 0.03*    Microbiology Results  Results for orders placed or performed during the hospital encounter of 02/13/18  Urine Culture     Status: Abnormal   Collection Time: 02/13/18  4:02 PM  Result Value Ref Range Status   Specimen Description   Final    URINE, RANDOM Performed at [facility], [address], [place], [place] [address]    Special Requests   Final    NONE Performed at [facility], [address] [place], [place] [address]    Culture >=100,000 COLONIES/mL ESCHERICHIA COLI (A)  Final   Report Status 02/15/2018 FINAL  Final   Organism ID, Bacteria ESCHERICHIA COLI (A)  Final      Susceptibility   Escherichia coli - MIC*    AMPICILLIN 4 SENSITIVE Sensitive     CEFAZOLIN <=4 SENSITIVE Sensitive     CEFTRIAXONE <=1 SENSITIVE Sensitive     CIPROFLOXACIN <=0.25 SENSITIVE Sensitive     GENTAMICIN <=1 SENSITIVE Sensitive     IMIPENEM <=0.25 SENSITIVE Sensitive     NITROFURANTOIN <=16 SENSITIVE Sensitive     TRIMETH/SULFA <=20 SENSITIVE Sensitive     AMPICILLIN/SULBACTAM <=2 SENSITIVE Sensitive     PIP/TAZO <=4 SENSITIVE Sensitive     Extended  ESBL NEGATIVE Sensitive     * >=100,000 COLONIES/mL ESCHERICHIA COLI    RADIOLOGY:  No results found.  Follow up with PCP in 1 week.  Management plans discussed with the patient, family and they are in agreement.  CODE STATUS: Full code    Code Status Orders  (From admission, onward)        Start     Ordered   03/09/18 0818  Full code  Continuous     03/09/18 0818    Code Status History    Date Active Date Inactive Code Status Order ID Comments User Context   03/01/2018 0050 03/02/2018 1811 Full Code [id number]  Cammy Copa, MD Inpatient   02/13/2018 0329 02/14/2018 2152 Full Code [id number]  Arnaldo Natal, MD Inpatient   05/25/2017 1044 05/25/2017 1428 Full Code [id number]  Annice Needy, MD Inpatient   04/26/2017 1417 04/26/2017 1910 Full Code [id number]  Annice Needy, MD Inpatient    Advance Directive Documentation     Most Recent Value  Type of Advance Directive  Healthcare Power of Attorney  Pre-existing out of facility DNR order (yellow form or pink MOST form)  -  "MOST" Form in Place?  -      TOTAL TIME TAKING CARE OF THIS PATIENT ON DAY OF DISCHARGE: more than 35 minutes.   Ihor Austin M.D on 03/11/2018 at 11:22 AM  Between 7am to 6pm - Pager - [phone]  After 6pm go to [email or web address] - password [email or web address] [facility]  [facility] [place] [facility]  Office  [phone]  CC: Primary care physician; Lauro Regulus, MD  Note: This dictation was prepared with Dragon dictation along with smaller phrase technology. Any transcriptional errors that result from this process are unintentional.

## 2018-03-11 NOTE — Progress Notes (Signed)
Advanced care plan.  Purpose of the Encounter: CODE STATUS  Parties in Attendance:Patient  Patient's Decision Capacity: Good  Subjective/Patient's story: Presented to emergency room for vomiting of blood   Objective/Medical story Patient has upper gastrointestinal bleeding He also has herpes zoster lesions over the right chest wall Patient needs gastroenterology evaluation and IV acyclovir for herpes zoster   Goals of care determination:  Advance care directives goals of care discussed with the patient in detail For now he wanted everything done which includes CPR, intubation if the need arises   CODE STATUS: Full code   Time spent discussing advanced care planning: 16 minutes

## 2018-03-11 NOTE — Progress Notes (Signed)
AVS reviewed with verbal understanding fro Pt and spouse. Stated no questions at this time. Rxs given upon discharge. Escorted to personal vehicle.

## 2018-04-24 ENCOUNTER — Ambulatory Visit
Admission: RE | Admit: 2018-04-24 | Discharge: 2018-04-24 | Disposition: A | Discharge status: Home / Self Care | Payer: Medicare HMO | Source: Ambulatory Visit | Attending: Internal Medicine | Admitting: Internal Medicine

## 2018-04-24 ENCOUNTER — Encounter
Admission: RE | Disposition: A | Discharge status: Home / Self Care | Payer: Self-pay | Source: Ambulatory Visit | Attending: Internal Medicine

## 2018-04-24 ENCOUNTER — Ambulatory Visit: Payer: Medicare HMO | Admitting: Anesthesiology

## 2018-04-24 ENCOUNTER — Encounter: Payer: Self-pay | Admitting: *Deleted

## 2018-04-24 DIAGNOSIS — I252 Old myocardial infarction: Secondary | ICD-10-CM | POA: Diagnosis not present

## 2018-04-24 DIAGNOSIS — K219 Gastro-esophageal reflux disease without esophagitis: Secondary | ICD-10-CM | POA: Insufficient documentation

## 2018-04-24 DIAGNOSIS — K2950 Unspecified chronic gastritis without bleeding: Secondary | ICD-10-CM | POA: Insufficient documentation

## 2018-04-24 DIAGNOSIS — Z79899 Other long term (current) drug therapy: Secondary | ICD-10-CM | POA: Diagnosis not present

## 2018-04-24 DIAGNOSIS — Z9049 Acquired absence of other specified parts of digestive tract: Secondary | ICD-10-CM | POA: Insufficient documentation

## 2018-04-24 DIAGNOSIS — I2510 Atherosclerotic heart disease of native coronary artery without angina pectoris: Secondary | ICD-10-CM | POA: Insufficient documentation

## 2018-04-24 DIAGNOSIS — Z888 Allergy status to other drugs, medicaments and biological substances status: Secondary | ICD-10-CM | POA: Diagnosis not present

## 2018-04-24 DIAGNOSIS — Z7982 Long term (current) use of aspirin: Secondary | ICD-10-CM | POA: Diagnosis not present

## 2018-04-24 DIAGNOSIS — Z95810 Presence of automatic (implantable) cardiac defibrillator: Secondary | ICD-10-CM | POA: Insufficient documentation

## 2018-04-24 DIAGNOSIS — N189 Chronic kidney disease, unspecified: Secondary | ICD-10-CM | POA: Diagnosis not present

## 2018-04-24 DIAGNOSIS — J449 Chronic obstructive pulmonary disease, unspecified: Secondary | ICD-10-CM | POA: Diagnosis not present

## 2018-04-24 DIAGNOSIS — K920 Hematemesis: Secondary | ICD-10-CM | POA: Diagnosis present

## 2018-04-24 DIAGNOSIS — E1122 Type 2 diabetes mellitus with diabetic chronic kidney disease: Secondary | ICD-10-CM | POA: Diagnosis not present

## 2018-04-24 DIAGNOSIS — I509 Heart failure, unspecified: Secondary | ICD-10-CM | POA: Diagnosis not present

## 2018-04-24 DIAGNOSIS — I130 Hypertensive heart and chronic kidney disease with heart failure and stage 1 through stage 4 chronic kidney disease, or unspecified chronic kidney disease: Secondary | ICD-10-CM | POA: Insufficient documentation

## 2018-04-24 DIAGNOSIS — E7800 Pure hypercholesterolemia, unspecified: Secondary | ICD-10-CM | POA: Insufficient documentation

## 2018-04-24 HISTORY — PX: ESOPHAGOGASTRODUODENOSCOPY (EGD) WITH PROPOFOL: SHX5813

## 2018-04-24 HISTORY — DX: Pseudocyst of pancreas: K86.3

## 2018-04-24 HISTORY — DX: Hypo-osmolality and hyponatremia: E87.1

## 2018-04-24 LAB — GLUCOSE, CAPILLARY: Glucose-Capillary: 248 mg/dL — ABNORMAL HIGH (ref 70–99)

## 2018-04-24 SURGERY — ESOPHAGOGASTRODUODENOSCOPY (EGD) WITH PROPOFOL
Anesthesia: General

## 2018-04-24 MED ORDER — FENTANYL CITRATE (PF) 100 MCG/2ML IJ SOLN
INTRAMUSCULAR | Status: DC | PRN
  Administered 2018-04-24: 50 ug via INTRAVENOUS

## 2018-04-24 MED ORDER — PROPOFOL 500 MG/50ML IV EMUL
INTRAVENOUS | Status: DC | PRN
  Administered 2018-04-24: 140 ug/kg/min via INTRAVENOUS

## 2018-04-24 MED ORDER — EPHEDRINE SULFATE 50 MG/ML IJ SOLN
INTRAMUSCULAR | Status: AC
  Filled 2018-04-24: qty 1

## 2018-04-24 MED ORDER — LIDOCAINE HCL (PF) 2 % IJ SOLN
INTRAMUSCULAR | Status: AC
  Filled 2018-04-24: qty 10

## 2018-04-24 MED ORDER — PROPOFOL 500 MG/50ML IV EMUL
INTRAVENOUS | Status: AC
  Filled 2018-04-24: qty 50

## 2018-04-24 MED ORDER — LIDOCAINE 2% (20 MG/ML) 5 ML SYRINGE
INTRAMUSCULAR | Status: DC | PRN
  Administered 2018-04-24: 30 mg via INTRAVENOUS

## 2018-04-24 MED ORDER — PHENYLEPHRINE HCL 10 MG/ML IJ SOLN
INTRAMUSCULAR | Status: DC | PRN
  Administered 2018-04-24: 100 ug via INTRAVENOUS

## 2018-04-24 MED ORDER — SODIUM CHLORIDE 0.9 % IV SOLN
INTRAVENOUS | Status: DC
  Administered 2018-04-24: 07:00:00 via INTRAVENOUS

## 2018-04-24 MED ORDER — PROPOFOL 10 MG/ML IV BOLUS
INTRAVENOUS | Status: AC
  Filled 2018-04-24: qty 20

## 2018-04-24 MED ORDER — PROPOFOL 10 MG/ML IV BOLUS
INTRAVENOUS | Status: DC | PRN
  Administered 2018-04-24: 80 mg via INTRAVENOUS

## 2018-04-24 MED ORDER — FENTANYL CITRATE (PF) 100 MCG/2ML IJ SOLN
INTRAMUSCULAR | Status: AC
  Filled 2018-04-24: qty 2

## 2018-04-24 MED ORDER — GLYCOPYRROLATE 0.2 MG/ML IJ SOLN
INTRAMUSCULAR | Status: AC
  Filled 2018-04-24: qty 1

## 2018-04-24 NOTE — Op Note (Signed)
[facility] Gastroenterology Patient Name: [name] Procedure Date: 04/24/2018 8:04 AM MRN: [id number] Account #: [id number] Date of Birth: [birth year] Admit Type: Outpatient Age: 72 Room: [facility] ENDO ROOM 2 Gender: Male Note Status: Finalized Procedure:            Upper GI endoscopy Indications:          Hematemesis, Nausea with vomiting Providers:            Boykin Nearing. [name] MD, MD Referring MD:         Marya Amsler. Dareen Piano MD, MD (Referring MD) Medicines:            Propofol per Anesthesia Complications:        No immediate complications. Procedure:            Pre-Anesthesia Assessment:                       - The risks and benefits of the procedure and the                        sedation options and risks were discussed with the                        patient. All questions were answered and informed                        consent was obtained.                       - Patient identification and proposed procedure were                        verified prior to the procedure by the nurse. The                        procedure was verified in the procedure room.                       - ASA Grade Assessment: III - A patient with severe                        systemic disease.                       - After reviewing the risks and benefits, the patient                        was deemed in satisfactory condition to undergo the                        procedure.                       After obtaining informed consent, the endoscope was                        passed under direct vision. Throughout the procedure,                        the patient's blood pressure, pulse, and oxygen  saturations were monitored continuously. The Endoscope                        was introduced through the mouth, and advanced to the                        third part of duodenum. The upper GI endoscopy was                        accomplished without difficulty. The  patient tolerated                        the procedure well. Findings:      The examined esophagus was normal.      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 This was characterized by healthy appearing mucosa and an       intact appearance.      The examined jejunum was normal.      Diffuse moderate inflammation characterized by congestion (edema) and       erythema was found in the stomach. Biopsies were taken with a cold       forceps for Helicobacter pylori testing.      The examined duodenum was normal.      The exam was otherwise without abnormality. Impression:           - Normal esophagus.                       - A gastrojejunostomy was found, characterized by an                        intact appearance and healthy appearing mucosa.                       - Normal examined jejunum.                       - Chronic gastritis. Biopsied.                       - Normal examined duodenum.                       - The examination was otherwise normal. Recommendation:       - Await pathology results.                       - Patient has a contact number available for                        emergencies. The signs and symptoms of potential                        delayed complications were discussed with the patient.                        Return to normal activities tomorrow. Written discharge                        instructions were provided to the patient.                       -  Resume previous diet.                       - Continue present medications.                       - Return to physician assistant in 8 weeks. Procedure Code(s):    --- Professional ---                       [phone], Esophagogastroduodenoscopy, flexible, transoral;                        with biopsy, single or multiple Diagnosis Code(s):    --- Professional ---                       R11.2, Nausea with vomiting, unspecified                       K92.0, Hematemesis                        K29.50, Unspecified chronic gastritis without bleeding                       Z98.0, Intestinal bypass and anastomosis status CPT copyright 2017 American Medical Association. All rights reserved. The codes documented in this report are preliminary and upon coder review may  be revised to meet current compliance requirements. Stanton Kidney MD, MD 04/24/2018 8:31:12 AM This report has been signed electronically. Number of Addenda: 0 Note Initiated On: 04/24/2018 8:04 AM      [facility]

## 2018-04-24 NOTE — Transfer of Care (Signed)
Immediate Anesthesia Transfer of Care Note  Patient: [name]  Procedure(s) Performed: ESOPHAGOGASTRODUODENOSCOPY (EGD) WITH PROPOFOL (N/A )  Patient Location: PACU and Endoscopy Unit  Anesthesia Type:General  Level of Consciousness: drowsy  Airway & Oxygen Therapy: Patient Spontanous Breathing and Patient connected to nasal cannula oxygen  Post-op Assessment: Report given to RN and Post -op Vital signs reviewed and stable  Post vital signs: Reviewed and stable  Last Vitals:  Vitals Value Taken Time  BP    Temp    Pulse 70 04/24/2018  8:22 AM  Resp 13 04/24/2018  8:22 AM  SpO2 94 % 04/24/2018  8:22 AM  Vitals shown include unvalidated device data.  Last Pain:  Vitals:   04/24/18 0705  TempSrc: Tympanic  PainSc: 0-No pain         Complications: No apparent anesthesia complications

## 2018-04-24 NOTE — Anesthesia Preprocedure Evaluation (Signed)
Anesthesia Evaluation  Patient identified by MRN, date of birth, ID band Patient awake    Reviewed: Allergy & Precautions, NPO status , Patient's Chart, lab work & pertinent test results, reviewed documented beta blocker date and time   Airway Mallampati: II  TM Distance: >3 FB     Dental  (+) Upper Dentures, Lower Dentures   Pulmonary pneumonia, resolved, COPD, Current Smoker,     + wheezing      Cardiovascular hypertension, Pt. on medications and Pt. on home beta blockers + angina with exertion + CAD, + Past MI and +CHF  + dysrhythmias + pacemaker + Cardiac Defibrillator   Paced   Neuro/Psych negative neurological ROS  negative psych ROS   GI/Hepatic GERD  Medicated and Controlled,(+) Hepatitis -, Unspecified  Endo/Other  diabetes, Well Controlled, Type 2, Insulin Dependent  Renal/GU Renal InsufficiencyRenal disease     Musculoskeletal negative musculoskeletal ROS (+)   Abdominal Normal abdominal exam  (+)   Peds  Hematology negative hematology ROS (+)   Anesthesia Other Findings   Reproductive/Obstetrics                             Anesthesia Physical  Anesthesia Plan  ASA: IV  Anesthesia Plan: General   Post-op Pain Management:    Induction: Intravenous  PONV Risk Score and Plan:   Airway Management Planned: Nasal Cannula  Additional Equipment:   Intra-op Plan:   Post-operative Plan:   Informed Consent: I have reviewed the patients History and Physical, chart, labs and discussed the procedure including the risks, benefits and alternatives for the proposed anesthesia with the patient or authorized representative who has indicated his/her understanding and acceptance.   Dental advisory given  Plan Discussed with: CRNA and Surgeon  Anesthesia Plan Comments:         Anesthesia Quick Evaluation

## 2018-04-24 NOTE — H&P (Signed)
Outpatient short stay form Pre-procedure 04/24/2018 7:59 AM [name] K. [name], M.D.  Primary Physician: Einar Crow, M.D.  Reason for visit: Hemetemesis  History of present illness:  72 y/o male with hemetemesis without abdominal pain. Denies heartburn. Has had previous ERCP by Caralyn Guile and Mike Gip c. 2014 at [facility]. He has undergone cholecystectomy. He has an ICD. He was admitted to the hospital in 03/09/18 for hemetemesis but EGD was delayed to the outpatient setting d/t hemodynamic stability.     Current Facility-Administered Medications:  .  0.9 %  sodium chloride infusion, , Intravenous, Continuous, Licet Dunphy, Boykin Nearing, MD  Medications Prior to Admission  Medication Sig Dispense Refill Last Dose  . amiodarone (PACERONE) 400 MG tablet Take 1 tablet (400 mg total) by mouth daily. 20 tablet 0 04/24/2018 at Unknown time  . aspirin EC 81 MG tablet Take 81 mg by mouth daily.   04/24/2018 at Unknown time  . digoxin (LANOXIN) 0.125 MG tablet Take 0.125 mg by mouth daily.   04/24/2018 at Unknown time  . metoprolol succinate (TOPROL-XL) 50 MG 24 hr tablet Take 50 mg by mouth 2 (two) times daily.   04/24/2018 at Unknown time  . Multiple Vitamin (MULTIVITAMIN) capsule Take 1 capsule by mouth daily.   04/24/2018 at Unknown time  . omeprazole (PRILOSEC) 40 MG capsule Take 40 mg by mouth daily.   04/24/2018 at Unknown time  . ondansetron (ZOFRAN) 4 MG/5ML solution Take by mouth every 8 (eight) hours as needed for nausea or vomiting.   04/24/2018 at Unknown time  . pravastatin (PRAVACHOL) 40 MG tablet Take 40 mg by mouth at bedtime.   04/23/2018 at Unknown time  . vitamin B-12 (CYANOCOBALAMIN) 1000 MCG tablet Take 1,000 mcg by mouth 2 (two) times daily.   04/24/2018 at Unknown time  . furosemide (LASIX) 20 MG tablet Take 20 mg by mouth daily as needed for fluid.    PRN at PRN  . guaiFENesin-dextromethorphan (ROBITUSSIN DM) 100-10 MG/5ML syrup Take 5 mLs by mouth every 4 (four) hours as needed for  cough. 118 mL 0 PRN at PRN  . insulin NPH Human (HUMULIN N,NOVOLIN N) 100 UNIT/ML injection Inject 0.18 mLs (18 Units total) into the skin 2 (two) times daily before a meal. (Patient taking differently: Inject 15 Units into the skin 2 (two) times daily before a meal. ) 10 mL 11 03/08/2018 at 1700  . Melatonin 5 MG TABS Take 10 mg by mouth at bedtime.    03/08/2018 at 2000  . nitroGLYCERIN (NITRODUR - DOSED IN MG/24 HR) 0.1 mg/hr patch Place 1 patch (0.1 mg total) onto the skin daily as needed. (Patient taking differently: Place 0.1 mg onto the skin daily as needed (chest pain). ) 30 patch 1 PRN at PRN  . nitroGLYCERIN (NITROSTAT) 0.4 MG SL tablet Place 0.4 mg under the tongue every 5 (five) minutes as needed for chest pain.   PRN at PRN  . pantoprazole (PROTONIX) 40 MG tablet Take 1 tablet (40 mg total) by mouth daily. 30 tablet 0   . spironolactone (ALDACTONE) 25 MG tablet Take 25 mg by mouth every morning before breakfast.   03/08/2018 at 0700  . triamcinolone cream (KENALOG) 0.5 % Apply 1 application topically 2 (two) times daily.   As directed at As directed     Allergies  Allergen Reactions  . Other     Intolerant to blood thinners - pt states that he has had "internal bleeding" while on blood thinners  . Captopril  Other (See Comments)  . Adhesive [Tape] Other (See Comments)    "PULLS SKIN OFF" PER PT REPORT. OK TO USE PAPER TAPE.     Past Medical History:  Diagnosis Date  . AICD (automatic cardioverter/defibrillator) present 2010   battery changed out 2015  . Anginal pain (HCC)    infrequent pain. usually feels as pain in the arms  . Bilateral swelling of feet   . CHF (congestive heart failure) (HCC)   . Chronic cough   . Chronic kidney disease    renal insufficiency.started on spironalactone  . COPD (chronic obstructive pulmonary disease) (HCC)    does not use o2  . Coronary artery disease   . Diabetes mellitus without complication (HCC)   . Dysrhythmia   . GERD  (gastroesophageal reflux disease)   . Hepatitis   . Hypercholesteremia   . Hypertension    history of,   . Hyponatremia   . Myocardial infarction (HCC) 1993   several. per patient, has had 10 heart attacks  . Pancreatic pseudocyst   . Pneumonia   . Presence of permanent cardiac pacemaker    and defibrilator    Review of systems:  Otherwise negative.    Physical Exam  Gen: Alert, oriented. Appears stated age.  HEENT: [place]/AT. PERRLA. Lungs: CTA, no wheezes. CV: RR nl S1, S2. Abd: soft, benign, no masses. BS+ Ext: No edema. Pulses 2+    Planned procedures: Proceed with EGD. The patient understands the nature of the planned procedure, indications, risks, alternatives and potential complications including but not limited to bleeding, infection, perforation, damage to internal organs and possible oversedation/side effects from anesthesia. The patient agrees and gives consent to proceed.  Please refer to procedure notes for findings, recommendations and patient disposition/instructions.     [name] [name], M.D. Gastroenterology 04/24/2018  7:59 AM

## 2018-04-24 NOTE — Anesthesia Post-op Follow-up Note (Signed)
Anesthesia QCDR form completed.        

## 2018-04-24 NOTE — Interval H&P Note (Signed)
History and Physical Interval Note:  04/24/2018 8:02 AM  [name]  has presented today for surgery, with the diagnosis of HX HEMAEMESIS  The various methods of treatment have been discussed with the patient and family. After consideration of risks, benefits and other options for treatment, the patient has consented to  Procedure(s): ESOPHAGOGASTRODUODENOSCOPY (EGD) WITH PROPOFOL (N/A) as a surgical intervention .  The patient's history has been reviewed, patient examined, no change in status, stable for surgery.  I have reviewed the patient's chart and labs.  Questions were answered to the patient's satisfaction.     [place], [place]

## 2018-04-25 ENCOUNTER — Encounter: Payer: Self-pay | Admitting: Internal Medicine

## 2018-04-25 LAB — SURGICAL PATHOLOGY

## 2018-04-25 NOTE — Anesthesia Postprocedure Evaluation (Signed)
Anesthesia Post Note  Patient: [name]  Procedure(s) Performed: ESOPHAGOGASTRODUODENOSCOPY (EGD) WITH PROPOFOL (N/A )  Patient location during evaluation: PACU Anesthesia Type: General Level of consciousness: awake and alert and oriented Pain management: pain level controlled Vital Signs Assessment: post-procedure vital signs reviewed and stable Respiratory status: spontaneous breathing Cardiovascular status: blood pressure returned to baseline Anesthetic complications: no     Last Vitals:  Vitals:   04/24/18 0833 04/24/18 0837  BP: (!) 95/59 107/69  Pulse:    Resp:    Temp:    SpO2:      Last Pain:  Vitals:   04/24/18 0853  TempSrc:   PainSc: 0-No pain                 Emony Dormer

## 2018-06-10 ENCOUNTER — Emergency Department
Admission: EM | Admit: 2018-06-10 | Discharge: 2018-06-10 | Disposition: A | Discharge status: Other Acute Inpatient Hospital | Payer: Medicare HMO | Attending: Emergency Medicine | Admitting: Emergency Medicine

## 2018-06-10 ENCOUNTER — Encounter
Admission: EM | Disposition: A | Discharge status: Other Acute Inpatient Hospital | Payer: Self-pay | Source: Home / Self Care | Attending: Emergency Medicine

## 2018-06-10 ENCOUNTER — Encounter: Payer: Self-pay | Admitting: Emergency Medicine

## 2018-06-10 ENCOUNTER — Emergency Department: Payer: Medicare HMO

## 2018-06-10 ENCOUNTER — Other Ambulatory Visit: Payer: Self-pay

## 2018-06-10 DIAGNOSIS — I5022 Chronic systolic (congestive) heart failure: Secondary | ICD-10-CM | POA: Diagnosis not present

## 2018-06-10 DIAGNOSIS — I25119 Atherosclerotic heart disease of native coronary artery with unspecified angina pectoris: Secondary | ICD-10-CM

## 2018-06-10 DIAGNOSIS — J449 Chronic obstructive pulmonary disease, unspecified: Secondary | ICD-10-CM | POA: Insufficient documentation

## 2018-06-10 DIAGNOSIS — E1122 Type 2 diabetes mellitus with diabetic chronic kidney disease: Secondary | ICD-10-CM | POA: Diagnosis not present

## 2018-06-10 DIAGNOSIS — Z7982 Long term (current) use of aspirin: Secondary | ICD-10-CM | POA: Insufficient documentation

## 2018-06-10 DIAGNOSIS — Z79899 Other long term (current) drug therapy: Secondary | ICD-10-CM | POA: Diagnosis not present

## 2018-06-10 DIAGNOSIS — Z95810 Presence of automatic (implantable) cardiac defibrillator: Secondary | ICD-10-CM | POA: Insufficient documentation

## 2018-06-10 DIAGNOSIS — N189 Chronic kidney disease, unspecified: Secondary | ICD-10-CM | POA: Diagnosis not present

## 2018-06-10 DIAGNOSIS — F17210 Nicotine dependence, cigarettes, uncomplicated: Secondary | ICD-10-CM | POA: Diagnosis not present

## 2018-06-10 DIAGNOSIS — I471 Supraventricular tachycardia: Secondary | ICD-10-CM | POA: Diagnosis not present

## 2018-06-10 DIAGNOSIS — I130 Hypertensive heart and chronic kidney disease with heart failure and stage 1 through stage 4 chronic kidney disease, or unspecified chronic kidney disease: Secondary | ICD-10-CM | POA: Insufficient documentation

## 2018-06-10 DIAGNOSIS — R002 Palpitations: Secondary | ICD-10-CM | POA: Diagnosis present

## 2018-06-10 DIAGNOSIS — I480 Paroxysmal atrial fibrillation: Secondary | ICD-10-CM

## 2018-06-10 DIAGNOSIS — I2510 Atherosclerotic heart disease of native coronary artery without angina pectoris: Secondary | ICD-10-CM | POA: Insufficient documentation

## 2018-06-10 DIAGNOSIS — I252 Old myocardial infarction: Secondary | ICD-10-CM | POA: Insufficient documentation

## 2018-06-10 DIAGNOSIS — I472 Ventricular tachycardia: Secondary | ICD-10-CM

## 2018-06-10 DIAGNOSIS — I509 Heart failure, unspecified: Secondary | ICD-10-CM | POA: Insufficient documentation

## 2018-06-10 DIAGNOSIS — Z794 Long term (current) use of insulin: Secondary | ICD-10-CM | POA: Diagnosis not present

## 2018-06-10 DIAGNOSIS — Z951 Presence of aortocoronary bypass graft: Secondary | ICD-10-CM | POA: Insufficient documentation

## 2018-06-10 LAB — CBC WITH DIFFERENTIAL/PLATELET
Abs Immature Granulocytes: 0.05 10*3/uL (ref 0.00–0.07)
BASOS ABS: 0.2 10*3/uL — AB (ref 0.0–0.1)
Basophils Relative: 1 %
EOS ABS: 0.2 10*3/uL (ref 0.0–0.5)
EOS PCT: 2 %
HEMATOCRIT: 40.8 % (ref 39.0–52.0)
Hemoglobin: 12.3 g/dL — ABNORMAL LOW (ref 13.0–17.0)
IMMATURE GRANULOCYTES: 0 %
LYMPHS ABS: 4.7 10*3/uL — AB (ref 0.7–4.0)
Lymphocytes Relative: 36 %
MCH: 26.9 pg (ref 26.0–34.0)
MCHC: 30.1 g/dL (ref 30.0–36.0)
MCV: 89.3 fL (ref 80.0–100.0)
MONOS PCT: 8 %
Monocytes Absolute: 1.1 10*3/uL — ABNORMAL HIGH (ref 0.1–1.0)
Neutro Abs: 6.8 10*3/uL (ref 1.7–7.7)
Neutrophils Relative %: 53 %
PLATELETS: 225 10*3/uL (ref 150–400)
RBC: 4.57 MIL/uL (ref 4.22–5.81)
RDW: 17.1 % — AB (ref 11.5–15.5)
WBC: 12.9 10*3/uL — ABNORMAL HIGH (ref 4.0–10.5)
nRBC: 0 % (ref 0.0–0.2)

## 2018-06-10 LAB — MAGNESIUM: Magnesium: 1.9 mg/dL (ref 1.7–2.4)

## 2018-06-10 LAB — TROPONIN I: Troponin I: 0.04 ng/mL (ref <0.03)

## 2018-06-10 LAB — BASIC METABOLIC PANEL
ANION GAP: 11 (ref 5–15)
BUN: 14 mg/dL (ref 8–23)
CALCIUM: 8.7 mg/dL — AB (ref 8.9–10.3)
CO2: 17 mmol/L — AB (ref 22–32)
CREATININE: 1.3 mg/dL — AB (ref 0.61–1.24)
Chloride: 106 mmol/L (ref 98–111)
GFR calc Af Amer: >60 mL/min (ref >60)
GFR calc non Af Amer: 53 mL/min — ABNORMAL LOW (ref >60)
GLUCOSE: 239 mg/dL — AB (ref 70–99)
Potassium: 4 mmol/L (ref 3.5–5.1)
Sodium: 134 mmol/L — ABNORMAL LOW (ref 135–145)

## 2018-06-10 SURGERY — CORONARY/GRAFT ACUTE MI REVASCULARIZATION
Anesthesia: Moderate Sedation

## 2018-06-10 MED ORDER — HEPARIN SODIUM (PORCINE) 1000 UNIT/ML IJ SOLN
INTRAMUSCULAR | Status: AC
  Filled 2018-06-10: qty 1

## 2018-06-10 MED ORDER — AMIODARONE IV BOLUS ONLY 150 MG/100ML
150.0000 mg | Freq: Once | INTRAVENOUS | Status: AC
  Administered 2018-06-10: 150 mg via INTRAVENOUS

## 2018-06-10 MED ORDER — FENTANYL CITRATE (PF) 100 MCG/2ML IJ SOLN
INTRAMUSCULAR | Status: AC
  Filled 2018-06-10: qty 2

## 2018-06-10 MED ORDER — ADENOSINE 12 MG/4ML IV SOLN
INTRAVENOUS | Status: AC
  Administered 2018-06-10: 6 mg via INTRAVENOUS
  Filled 2018-06-10: qty 4

## 2018-06-10 MED ORDER — ADENOSINE 6 MG/2ML IV SOLN
INTRAVENOUS | Status: AC
  Filled 2018-06-10: qty 2

## 2018-06-10 MED ORDER — MIDAZOLAM HCL 2 MG/2ML IJ SOLN
INTRAMUSCULAR | Status: AC | PRN
  Administered 2018-06-10 (×2): 2 mg via INTRAVENOUS

## 2018-06-10 MED ORDER — VERAPAMIL HCL 2.5 MG/ML IV SOLN
INTRAVENOUS | Status: AC
  Filled 2018-06-10: qty 2

## 2018-06-10 MED ORDER — ONDANSETRON HCL 4 MG/2ML IJ SOLN
INTRAMUSCULAR | Status: AC
  Filled 2018-06-10: qty 2

## 2018-06-10 MED ORDER — FENTANYL CITRATE (PF) 100 MCG/2ML IJ SOLN
INTRAMUSCULAR | Status: AC | PRN
  Administered 2018-06-10: 50 ug via INTRAVENOUS

## 2018-06-10 MED ORDER — HEPARIN (PORCINE) IN NACL 1000-0.9 UT/500ML-% IV SOLN
INTRAVENOUS | Status: AC
  Filled 2018-06-10: qty 1000

## 2018-06-10 MED ORDER — ADENOSINE 12 MG/4ML IV SOLN
12.0000 mg | Freq: Once | INTRAVENOUS | Status: AC
  Administered 2018-06-10: 12 mg via INTRAVENOUS

## 2018-06-10 MED ORDER — MIDAZOLAM HCL 5 MG/5ML IJ SOLN
INTRAMUSCULAR | Status: AC
  Filled 2018-06-10: qty 5

## 2018-06-10 MED ORDER — ADENOSINE 6 MG/2ML IV SOLN
6.0000 mg | Freq: Once | INTRAVENOUS | Status: AC
  Administered 2018-06-10: 6 mg via INTRAVENOUS

## 2018-06-10 MED ORDER — MIDAZOLAM HCL 2 MG/2ML IJ SOLN
INTRAMUSCULAR | Status: AC
  Filled 2018-06-10: qty 2

## 2018-06-10 NOTE — ED Provider Notes (Addendum)
[facility] Emergency Department Provider Note ____________________________________________   First MD Initiated Contact with Patient 06/10/18 1824     (approximate)  I have reviewed the triage vital signs and the nursing notes.   HISTORY  Chief Complaint Tachycardia  Level 5 caveat: History of present illness limited due to unstable patient  HPI [name] is a 72 y.o. male with PMH as noted below including extensive cardiac history with CAD, prior MIs, CHF, and tachydysrhythmias who presents with acute onset of near syncope and lightheadedness.  The patient states he was on his porch and stood up.  He then had nausea and vomiting which has persisted.  He reports palpitations and to need lightheadedness.  He was found by EMS to be in a tachycardia in the 150s.   Past Medical History:  Diagnosis Date  . AICD (automatic cardioverter/defibrillator) present 2010   battery changed out 2015  . Anginal pain (HCC)    infrequent pain. usually feels as pain in the arms  . Bilateral swelling of feet   . CHF (congestive heart failure) (HCC)   . Chronic cough   . Chronic kidney disease    renal insufficiency.started on spironalactone  . COPD (chronic obstructive pulmonary disease) (HCC)    does not use o2  . Coronary artery disease   . Diabetes mellitus without complication (HCC)   . Dysrhythmia   . GERD (gastroesophageal reflux disease)   . Hepatitis   . Hypercholesteremia   . Hypertension    history of,   . Hyponatremia   . Myocardial infarction (HCC) 1993   several. per patient, has had 10 heart attacks  . Pancreatic pseudocyst   . Pneumonia   . Presence of permanent cardiac pacemaker    and defibrilator    Patient Active Problem List   Diagnosis Date Noted  . Coffee ground emesis 03/09/2018  . Shingles 03/09/2018  . UGIB (upper gastrointestinal bleed)   . PNA (pneumonia) 02/28/2018  . Ventricular tachycardia (HCC) 02/13/2018  .  Protein-calorie malnutrition, severe 02/13/2018  . Hyperlipidemia 05/17/2017  . Hypertension 04/21/2017  . Diabetes mellitus without complication (HCC) 04/21/2017  . Tobacco use disorder 04/21/2017  . Atherosclerosis of native arteries of extremity with intermittent claudication (HCC) 04/21/2017    Past Surgical History:  Procedure Laterality Date  . CARDIAC CATHETERIZATION     with stents  . CARDIAC DEFIBRILLATOR PLACEMENT  2010  . CATARACT EXTRACTION W/PHACO Left 02/23/2016   Procedure: CATARACT EXTRACTION PHACO AND INTRAOCULAR LENS PLACEMENT (IOC);  Surgeon: Galen Manila, MD;  Location: [facility] ORS;  Service: Ophthalmology;  Laterality: Left;  [place] 1.05 AP% 22.9 CDE 15.02 Fluid pack lot # [id number]  . CHOLECYSTECTOMY  2014  . CORONARY ARTERY BYPASS GRAFT    . ERCP W/ SPHICTEROTOMY    . ESOPHAGOGASTRODUODENOSCOPY (EGD) WITH PROPOFOL N/A 04/24/2018   Procedure: ESOPHAGOGASTRODUODENOSCOPY (EGD) WITH PROPOFOL;  Surgeon: Toledo, Boykin Nearing, MD;  Location: [facility] ENDOSCOPY;  Service: Gastroenterology;  Laterality: N/A;  . feeding tube removal  2014  . GASTROSTOMY W/ FEEDING TUBE  2014   after galbladder surgery.  . LOWER EXTREMITY ANGIOGRAPHY Left 04/26/2017   Procedure: Lower Extremity Angiography;  Surgeon: Annice Needy, MD;  Location: [facility] INVASIVE CV LAB;  Service: Cardiovascular;  Laterality: Left;  . LOWER EXTREMITY ANGIOGRAPHY Right 05/25/2017   Procedure: Lower Extremity Angiography;  Surgeon: Annice Needy, MD;  Location: [facility] INVASIVE CV LAB;  Service: Cardiovascular;  Laterality: Right;  . PANCREATIC CYST EXCISION  2014  had hepatitis at this time  . STENT PLACEMENT NON-VASCULAR ([facility] HX)     cardiac stents x many    Prior to Admission medications   Medication Sig Start Date End Date Taking? Authorizing Provider  metoprolol succinate (TOPROL-XL) 50 MG 24 hr tablet Take 50 mg by mouth 2 (two) times daily.   Yes [provider]  amiodarone (PACERONE) 400 MG tablet Take 1  tablet (400 mg total) by mouth daily. 02/15/18   Enedina Finner, MD  aspirin EC 81 MG tablet Take 81 mg by mouth daily.    [provider]  digoxin (LANOXIN) 0.125 MG tablet Take 0.125 mg by mouth daily.    [provider]  furosemide (LASIX) 20 MG tablet Take 20 mg by mouth daily as needed for fluid.     [provider]  guaiFENesin-dextromethorphan (ROBITUSSIN DM) 100-10 MG/5ML syrup Take 5 mLs by mouth every 4 (four) hours as needed for cough. 03/02/18   Shaune Pollack, MD  insulin NPH Human (HUMULIN N,NOVOLIN N) 100 UNIT/ML injection Inject 0.18 mLs (18 Units total) into the skin 2 (two) times daily before a meal. Patient taking differently: Inject 15 Units into the skin 2 (two) times daily before a meal.  02/14/18   Enedina Finner, MD  Melatonin 5 MG TABS Take 10 mg by mouth at bedtime.     [provider]  Multiple Vitamin (MULTIVITAMIN) capsule Take 1 capsule by mouth daily.    [provider]  nitroGLYCERIN (NITRODUR - DOSED IN MG/24 HR) 0.1 mg/hr patch Place 1 patch (0.1 mg total) onto the skin daily as needed. Patient taking differently: Place 0.1 mg onto the skin daily as needed (chest pain).  02/14/18   Enedina Finner, MD  nitroGLYCERIN (NITROSTAT) 0.4 MG SL tablet Place 0.4 mg under the tongue every 5 (five) minutes as needed for chest pain.    [provider]  omeprazole (PRILOSEC) 40 MG capsule Take 40 mg by mouth daily.    [provider]  ondansetron [facility]) 4 MG/5ML solution Take by mouth every 8 (eight) hours as needed for nausea or vomiting.    [provider]  pantoprazole (PROTONIX) 40 MG tablet Take 1 tablet (40 mg total) by mouth daily. 03/11/18 04/10/18  Ihor Austin, MD  pravastatin (PRAVACHOL) 40 MG tablet Take 40 mg by mouth at bedtime.    [provider]  spironolactone (ALDACTONE) 25 MG tablet Take 25 mg by mouth every morning before breakfast. 05/17/16   [provider]  triamcinolone cream (KENALOG)  0.5 % Apply 1 application topically 2 (two) times daily. 01/17/18   [provider]  vitamin B-12 (CYANOCOBALAMIN) 1000 MCG tablet Take 1,000 mcg by mouth 2 (two) times daily.    [provider]    Allergies Other; Captopril; and Adhesive [tape]  Family History  Problem Relation Age of Onset  . Heart attack Mother   . Cancer Brother   . CAD Brother     Social History Social History   Tobacco Use  . Smoking status: Current Every Day Smoker    Packs/day: 1.00    Years: 55.00    Pack years: 55.00  . Smokeless tobacco: Never Used  Substance Use Topics  . Alcohol use: No    Comment: rare  . Drug use: No    Review of Systems Level 5 caveat: Unable to obtain review of systems due to unstable patient    ____________________________________________   PHYSICAL EXAM:  VITAL SIGNS: ED Triage Vitals  Enc Vitals Group     BP 06/10/18 1811 (!) 117/103     Pulse Rate 06/10/18 1813 95     Resp 06/10/18 1812 (!) 26     Temp --      Temp src --      SpO2 06/10/18 1813 (!) 86 %     Weight 06/10/18 1823 162 lb 4.1 oz (73.6 kg)     Height --      Head Circumference --      Peak Flow --      Pain Score 06/10/18 1812 0     Pain Loc --      Pain Edu? --      Excl. in GC? --     Constitutional: Alert and oriented.  Weak and uncomfortable appearing. Eyes: Conjunctivae are normal.  Head: Atraumatic. Nose: No congestion/rhinnorhea. Mouth/Throat: Mucous membranes are somewhat dry.   Neck: Normal range of motion.  Cardiovascular: Tachycardic, regular rhythm. Grossly normal heart sounds.  Good peripheral circulation. Respiratory: Normal respiratory effort.  No retractions. Lungs CTAB. Gastrointestinal: Soft and nontender. No distention.  Genitourinary: No flank tenderness. Musculoskeletal: Trace bilateral lower extremity edema.  Extremities warm and well perfused.  Neurologic:  Normal speech and language. No gross focal neurologic deficits are appreciated.  Skin:   Skin is warm and dry. No rash noted. Psychiatric: Mood and affect are normal. Speech and behavior are normal.  ____________________________________________   LABS (all labs ordered are listed, but only abnormal results are displayed)  Labs Reviewed  CBC WITH DIFFERENTIAL/PLATELET - Abnormal; Notable for the following components:      Result Value   WBC 12.9 (*)    Hemoglobin 12.3 (*)    RDW 17.1 (*)    Lymphs Abs 4.7 (*)    Monocytes Absolute 1.1 (*)    Basophils Absolute 0.2 (*)    All other components within normal limits  BASIC METABOLIC PANEL - Abnormal; Notable for the following components:   Sodium 134 (*)    CO2 17 (*)    Glucose, Bld 239 (*)    Creatinine, Ser 1.30 (*)    Calcium 8.7 (*)    GFR calc non Af Amer 53 (*)    All other components within normal limits  TROPONIN I - Abnormal; Notable for the following components:   Troponin I 0.04 (*)    All other components within normal limits  MAGNESIUM   ____________________________________________  EKG  ED ECG REPORT I, Dionne Bucy, the attending physician, personally viewed and interpreted this ECG.  Date: 06/10/2018 EKG Time: 1815 Rate: 156 Rhythm: Wide-complex tachycardia QRS Axis: normal Intervals: normal ST/T Wave abnormalities: normal Narrative Interpretation: V. tach versus SVT with aberrancy  ED ECG REPORT I, Dionne Bucy, the attending physician, personally viewed and interpreted this ECG.  Date: 06/10/2018 EKG Time: 1907 Rate: 60 Rhythm: AV dual paced rhythm QRS Axis: normal Intervals: n/a ST/T Wave abnormalities: normal Narrative Interpretation: AV dual paced rhythm at normal rate   ____________________________________________  RADIOLOGY  CXR: No focal infiltrate or other acute abnormality  ____________________________________________   PROCEDURES  Procedure(s) performed: Yes    .Sedation Date/Time: 06/10/2018 8:17 PM Performed by: Dionne Bucy,  MD Authorized by: Dionne Bucy, MD   Consent:    Consent obtained:  Verbal (electronic informed consent)   Consent given by:  Patient   Risks discussed:  Dysrhythmia, vomiting, respiratory compromise necessitating ventilatory assistance and intubation, prolonged sedation necessitating reversal and prolonged hypoxia resulting in organ damage Universal protocol:  Procedure explained and questions answered to patient or proxy's satisfaction: yes     Relevant documents present and verified: yes     Test results available and properly labeled: yes     Imaging studies available: yes     Required blood products, implants, devices, and special equipment available: yes     Immediately prior to procedure a time out was called: yes     Patient identity confirmation method:  Arm band Indications:    Procedure performed:  Cardioversion   Procedure necessitating sedation performed by:  Physician performing sedation Pre-sedation assessment:    Time since last food or drink:  Unknown   NPO status caution: unable to specify NPO status and urgency dictates proceeding with non-ideal NPO status     ASA classification: class 3 - patient with severe systemic disease     Mallampati score:  II - soft palate, uvula, fauces visible   Pre-sedation assessments completed and reviewed: airway patency, cardiovascular function and mental status   Immediate pre-procedure details:    Reassessment: Patient reassessed immediately prior to procedure     Reviewed: vital signs, relevant labs/tests and NPO status     Verified: bag valve mask available, emergency equipment available, intubation equipment available, IV patency confirmed, oxygen available, reversal medications available and suction available   Procedure details (see MAR for exact dosages):    Preoxygenation:  Nasal cannula   Sedation:  Midazolam   Analgesia:  Fentanyl   Intra-procedure monitoring:  Blood pressure monitoring, continuous pulse oximetry,  cardiac monitor, frequent vital sign checks and frequent LOC assessments   Intra-procedure events: none     Total Provider sedation time (minutes):  5 Post-procedure details:    Attendance: Constant attendance by certified staff until patient recovered     Recovery: Patient returned to pre-procedure baseline     Post-sedation assessments completed and reviewed: airway patency, cardiovascular function, hydration status, mental status and respiratory function     Patient is stable for discharge or admission: yes     Patient tolerance:  Tolerated well, no immediate complications  .Cardioversion Date/Time: 06/10/2018 8:46 PM Performed by: Dionne Bucy, MD Authorized by: Dionne Bucy, MD   Consent:    Consent obtained:  Verbal   Consent given by:  Patient   Risks discussed:  Induced arrhythmia and death Pre-procedure details:    Cardioversion basis:  Emergent   Rhythm:  Supraventricular tachycardia Patient sedated: Yes. Refer to sedation procedure documentation for details of sedation.  Attempt one:    Cardioversion mode:  Synchronous   Waveform:  Biphasic   Shock (Joules):  200   Shock outcome:  Conversion to other rhythm Post-procedure details:    Patient status:  Awake   Patient tolerance of procedure:  Tolerated well, no immediate complications    Critical Care performed: Yes  CRITICAL CARE Performed by: Dionne Bucy   Total critical care time: 60 minutes  Critical care time was exclusive of separately billable procedures and treating other patients.  Critical care was necessary to treat or prevent imminent or life-threatening deterioration.  Critical care was time spent personally by me on the following activities: development of treatment plan with patient and/or surrogate as well as nursing, discussions with consultants, evaluation of patient's response to treatment, examination of patient, obtaining history from patient or surrogate, ordering and  performing treatments and interventions, ordering and review of laboratory studies, ordering and review of radiographic studies, pulse oximetry and re-evaluation of patient's condition. ____________________________________________   INITIAL IMPRESSION /  ASSESSMENT AND PLAN / ED COURSE  Pertinent labs & imaging results that were available during my care of the patient were reviewed by me and considered in my medical decision making (see chart for details).  72 year old male with extensive cardiac history as noted above presents with near syncope associate with nausea and vomiting, found to be in a wide-complex tachycardia in the 150s by EMS.  EMS called the patient in as a STEMI although he is not having chest pain.  On ED arrival, the patient was found to be in a wide-complex tachycardia in the 150s, with stable blood pressure.  He was initially actively vomiting although this improved after a few minutes.  The remainder of the exam is as described above.  Since STEMI was activated, Dr. Okey Dupre from cardiology arrived at the bedside.  I initiated an amiodarone bolus with no significant response.  Dr. Okey Dupre is now attempting to interrogate the patient's ICD.  The patient may require electrical cardioversion although his blood pressure remained stable at this time.  ----------------------------------------- 8:13 PM on 06/10/2018 -----------------------------------------  Dr. Okey Dupre interrogated the pacemaker and found findings consistent with SVT with aberrancy rather than V. tach.  There is no response with amiodarone, so we gave adenosine 6 mg and then 12 mg with no response.  Dr. Okey Dupre attempted to overdrive pace but this also did not work.  We decided to proceed with electrical cardioversion.  The patient was verbally consented due to the emergent nature of the procedure as his blood pressure was now in the 90s.  The patient was given fentanyl and Versed and successfully cardioverted on the first  attempt.  Dr. Okey Dupre recommends that the patient be admitted to a stepdown or ICU level, but there are no beds available on these floors currently.  I discussed disposition with the patient's wife;  Since he is typically followed at [facility] and has a relatively extensive and complex cardiac history, I suggested transfer to [facility] and she agreed.  I contacted the [facility] transfer center.  The patient will be accepted and the accepting physician is Dr. Tristan Schroeder.  The patient's blood pressure remained stable after the cardioversion and he is in a paced rhythm in the 60s.  He is stable for transfer at this time.  ____________________________________________   FINAL CLINICAL IMPRESSION(S) / ED DIAGNOSES  Final diagnoses:  SVT (supraventricular tachycardia) (HCC)      NEW MEDICATIONS STARTED DURING THIS VISIT:  New Prescriptions   No medications on file     Note:  This document was prepared using Dragon voice recognition software and may include unintentional dictation errors.    Dionne Bucy, MD 06/10/18 1610    Dionne Bucy, MD 06/10/18 2107

## 2018-06-10 NOTE — Sedation Documentation (Signed)
Report given to Jen D, RN.

## 2018-06-10 NOTE — Consult Note (Signed)
Cardiology Consultation:   Patient ID: [name] MRN: [id number]; DOB: 05/15/1946  Admit date: 06/10/2018 Date of Consult: 06/10/2018  Primary Care Provider: Lauro Regulus, MD Primary Cardiologist: Adrian Blackwater, MD Primary Electrophysiologist:  Gerre Pebbles, MD - [facility]   Patient Profile:   [name] is a 72 y.o. male with a hx of coronary artery disease with CABG and PCI complicated by ischemic cardiomyopathy, SVT and VT status post BiV/ICD complicated by bacteremia requiring device removal and reimplantation, inappropriate shocks in the past, mitral regurgitation status post annuloplasty, PAD, paroxysmal atrial fibrillation, and medication noncompliance, who is being seen today for the evaluation of ventricular tachycardia at the request of Dr. Marisa Severin.  History of Present Illness:   Mr. Struble was in his usual state of health until this evening when he became lightheaded and nauseated.  He threw up a salad that he had just eaten.  EMS was summoned and found the patient to be in a wide-complex tachycardia.  Upon arrival at [facility], code STEMI was paged.  Patient reports feeling lightheaded and having gradual onset of left arm pain after arrival at [facility].  He denies chest pain, shortness of breath, and edema.  He reports having been compliant with his medications.  He did not receive any ICD shocks recently.  EKG was reviewed with the ER team, demonstrating regular wide-complex tachycardia with apparent retrograde P waves most suggestive of SVT with aberrancy.  At the time of my arrival, he was receiving amiodarone 150 mg IV bolus.  There was no change in his rhythm.  His device was interrogated demonstrating tachycardia with 1:1 AV conduction at a rate of 150 bpm.  I attempted manual antitachycardia pacing of both the atrium and right ventricle without termination of the arrhythmia.  Adenosine 6 mg, followed by 12 mg was administered without termination of the arrhythmia.  Decision was  therefore made to proceed with sternal cardioversion given that the patient was becoming symptomatic with soft blood pressure.  Sedation was administered with fentanyl and medazepam under the supervision of Dr. Marisa Severin, and the patient was successfully cardioverted with a single shock at 200 J.  Post cardioversion EKG showed AV pacing with biventricular pacing.  Patient was somnolent but without complaints after the procedure.  Past Medical History:  Diagnosis Date  . AICD (automatic cardioverter/defibrillator) present 2010   battery changed out 2015  . Anginal pain (HCC)    infrequent pain. usually feels as pain in the arms  . Bilateral swelling of feet   . CHF (congestive heart failure) (HCC)   . Chronic cough   . Chronic kidney disease    renal insufficiency.started on spironalactone  . COPD (chronic obstructive pulmonary disease) (HCC)    does not use o2  . Coronary artery disease   . Diabetes mellitus without complication (HCC)   . Dysrhythmia   . GERD (gastroesophageal reflux disease)   . Hepatitis   . Hypercholesteremia   . Hypertension    history of,   . Hyponatremia   . Myocardial infarction (HCC) 1993   several. per patient, has had 10 heart attacks  . Pancreatic pseudocyst   . Pneumonia   . Presence of permanent cardiac pacemaker    and defibrilator    Past Surgical History:  Procedure Laterality Date  . CARDIAC CATHETERIZATION     with stents  . CARDIAC DEFIBRILLATOR PLACEMENT  2010  . CATARACT EXTRACTION W/PHACO Left 02/23/2016   Procedure: CATARACT EXTRACTION PHACO AND INTRAOCULAR LENS PLACEMENT (IOC);  Surgeon: Galen Manila, MD;  Location: [facility] ORS;  Service: Ophthalmology;  Laterality: Left;  [place] 1.05 AP% 22.9 CDE 15.02 Fluid pack lot # [id number]  . CHOLECYSTECTOMY  2014  . CORONARY ARTERY BYPASS GRAFT    . ERCP W/ SPHICTEROTOMY    . ESOPHAGOGASTRODUODENOSCOPY (EGD) WITH PROPOFOL N/A 04/24/2018   Procedure: ESOPHAGOGASTRODUODENOSCOPY (EGD) WITH PROPOFOL;   Surgeon: Toledo, Boykin Nearing, MD;  Location: [facility] ENDOSCOPY;  Service: Gastroenterology;  Laterality: N/A;  . feeding tube removal  2014  . GASTROSTOMY W/ FEEDING TUBE  2014   after galbladder surgery.  . LOWER EXTREMITY ANGIOGRAPHY Left 04/26/2017   Procedure: Lower Extremity Angiography;  Surgeon: Annice Needy, MD;  Location: [facility] INVASIVE CV LAB;  Service: Cardiovascular;  Laterality: Left;  . LOWER EXTREMITY ANGIOGRAPHY Right 05/25/2017   Procedure: Lower Extremity Angiography;  Surgeon: Annice Needy, MD;  Location: [facility] INVASIVE CV LAB;  Service: Cardiovascular;  Laterality: Right;  . PANCREATIC CYST EXCISION  2014   had hepatitis at this time  . STENT PLACEMENT NON-VASCULAR ([facility] HX)     cardiac stents x many     Home Medications:  Prior to Admission medications   Medication Sig Start Date Yaneth Fairbairn Date Taking? Authorizing Provider  metoprolol succinate (TOPROL-XL) 50 MG 24 hr tablet Take 50 mg by mouth 2 (two) times daily.   Yes [provider]  amiodarone (PACERONE) 400 MG tablet Take 1 tablet (400 mg total) by mouth daily. 02/15/18   Enedina Finner, MD  aspirin EC 81 MG tablet Take 81 mg by mouth daily.    [provider]  digoxin (LANOXIN) 0.125 MG tablet Take 0.125 mg by mouth daily.    [provider]  furosemide (LASIX) 20 MG tablet Take 20 mg by mouth daily as needed for fluid.     [provider]  guaiFENesin-dextromethorphan (ROBITUSSIN DM) 100-10 MG/5ML syrup Take 5 mLs by mouth every 4 (four) hours as needed for cough. 03/02/18   Shaune Pollack, MD  insulin NPH Human (HUMULIN N,NOVOLIN N) 100 UNIT/ML injection Inject 0.18 mLs (18 Units total) into the skin 2 (two) times daily before a meal. Patient taking differently: Inject 15 Units into the skin 2 (two) times daily before a meal.  02/14/18   Enedina Finner, MD  Melatonin 5 MG TABS Take 10 mg by mouth at bedtime.     [provider]  Multiple Vitamin (MULTIVITAMIN) capsule Take 1 capsule by mouth  daily.    [provider]  nitroGLYCERIN (NITRODUR - DOSED IN MG/24 HR) 0.1 mg/hr patch Place 1 patch (0.1 mg total) onto the skin daily as needed. Patient taking differently: Place 0.1 mg onto the skin daily as needed (chest pain).  02/14/18   Enedina Finner, MD  nitroGLYCERIN (NITROSTAT) 0.4 MG SL tablet Place 0.4 mg under the tongue every 5 (five) minutes as needed for chest pain.    [provider]  omeprazole (PRILOSEC) 40 MG capsule Take 40 mg by mouth daily.    [provider]  ondansetron [facility]) 4 MG/5ML solution Take by mouth every 8 (eight) hours as needed for nausea or vomiting.    [provider]  pantoprazole (PROTONIX) 40 MG tablet Take 1 tablet (40 mg total) by mouth daily. 03/11/18 04/10/18  Ihor Austin, MD  pravastatin (PRAVACHOL) 40 MG tablet Take 40 mg by mouth at bedtime.    [provider]  spironolactone (ALDACTONE) 25 MG tablet Take 25 mg by mouth every morning before breakfast. 05/17/16  [provider]  triamcinolone cream (KENALOG) 0.5 % Apply 1 application topically 2 (two) times daily. 01/17/18   [provider]  vitamin B-12 (CYANOCOBALAMIN) 1000 MCG tablet Take 1,000 mcg by mouth 2 (two) times daily.    [provider]    Inpatient Medications: Scheduled Meds: . ondansetron       Continuous Infusions:  PRN Meds: fentaNYL, midazolam  Allergies:    Allergies  Allergen Reactions  . Other     Intolerant to blood thinners - pt states that he has had "internal bleeding" while on blood thinners  . Captopril Other (See Comments)  . Adhesive [Tape] Other (See Comments)    "PULLS SKIN OFF" PER PT REPORT. OK TO USE PAPER TAPE.    Social History:   Social History   Tobacco Use  . Smoking status: Current Every Day Smoker    Packs/day: 1.00    Years: 55.00    Pack years: 55.00  . Smokeless tobacco: Never Used  Substance Use Topics  . Alcohol use: No    Comment: rare  . Drug use: No       Family History:   Family History  Problem Relation Age of Onset  . Heart attack Mother   . Cancer Brother   . CAD Brother      ROS:  Review of Systems  Unable to perform ROS: Critical illness   Physical Exam/Data:   Vitals:   06/10/18 1908 06/10/18 1910 06/10/18 1915 06/10/18 1920  BP: 119/74 123/74 107/67 102/67  Pulse: 60 (!) 59 (!) 59 60  Resp: 16 16 15 15   SpO2: 97% 95% 96% 97%  Weight:        Intake/Output Summary (Last 24 hours) at 06/10/2018 1939 Last data filed at 06/10/2018 1808 Gross per 24 hour  Intake 300 ml  Output -  Net 300 ml   Filed Weights   06/10/18 1823  Weight: 73.6 kg   Body mass index is 27 kg/m.  General: Chronically ill-appearing man, lying on gurney. HEENT: normal Lymph: no adenopathy Neck: no JVD Endocrine:  No thryomegaly Vascular: No carotid bruits; plus radial pulses bilaterally. Cardiac: Regular rate and rhythm without murmurs. Lungs: Worse breath sounds bilaterally without wheezes or crackles. Abd: soft, nontender, no hepatomegaly  Ext: no edema Musculoskeletal:  No deformities, BUE and BLE strength normal and equal Skin: warm and dry  Neuro:  CNs 2-12 intact, no focal abnormalities noted Psych:  Normal affect   EKG:  The EKG was personally reviewed and demonstrates:   18:15: Wide-complex tachycardia with apparent retrograde P waves most suggestive of SVT with aberrancy and less likely VT. 19:07: AV paced. Telemetry:  Telemetry was personally reviewed and demonstrates: Wide-complex tachycardia with rate of approximately 150 bpm.  Relevant CV Studies: Echocardiogram (06/08/2017, [facility]): LV size with LVEF of 30% and inferoseptal akinesis.  Otherwise global hypokinesis.  Mitral annuloplasty ring noted.  RV moderately enlarged with moderately reduced contraction.  Trivial aortic, mitral, and pulmonary regurgitation.  Mild tricuspid regurgitation.  Limited device interrogation (06/10/2018): Device: [employer] Resonate X4  CRT-D Mode: DDDR, lower rate limit 60 bpm, max tracking/sensing rate 130 bpm. VT: 160 bpm; burst, ramp, 41 J x 6. VF: 220 bpm; ATP, 41 J x 8. Multiple episodes of VT and ATR noted today beginning at 16:21.  No shocks but at least episodes of ATP.  Laboratory Data:  Chemistry Recent Labs  Lab 06/10/18 1817  NA 134*  K 4.0  CL 106  CO2 17*  GLUCOSE 239*  BUN 14  CREATININE 1.30*  CALCIUM 8.7*  GFRNONAA 53*  GFRAA >60  ANIONGAP 11    No results for input(s): PROT, ALBUMIN, AST, ALT, ALKPHOS, BILITOT in the last 168 hours. Hematology Recent Labs  Lab 06/10/18 1817  WBC 12.9*  RBC 4.57  HGB 12.3*  HCT 40.8  MCV 89.3  MCH 26.9  MCHC 30.1  RDW 17.1*  PLT 225   Cardiac Enzymes Recent Labs  Lab 06/10/18 1817  TROPONINI 0.04*   No results for input(s): TROPIPOC in the last 168 hours.  BNPNo results for input(s): BNP, PROBNP in the last 168 hours.  DDimer No results for input(s): DDIMER in the last 168 hours.  Radiology/Studies:  Dg Chest Portable 1 View  Result Date: 06/10/2018 CLINICAL DATA:  Possible seizure. EXAM: PORTABLE CHEST 1 VIEW COMPARISON:  March 09, 2018 FINDINGS: Stable AICD device. Stable cardiomegaly. The hila, mediastinum, lungs, and pleura are otherwise unchanged and unremarkable. IMPRESSION: No active disease. Electronically Signed   By: Gerome Sam III M.D   On: 06/10/2018 18:53    Assessment and Plan:   Wide-complex tachycardia EKG and device interrogation most consistent with SVT with aberrancy.  Patient has a history of both SVT and VT per EP notes from [place].  Arrhythmia could not be terminated with overdrive pacing, amiodarone bolus, or adenosine.  Patient therefore underwent successful cardioversion due to developing symptoms (lightheadedness and left arm pain) as well as declining blood pressure.  Though he has a history of coronary artery disease, I do not believe that his presentation is consistent with a STEMI.  EKG is of limited utility  given biventricular pacing.  Dr. Marisa Severin and I therefore have agreed that emergent cardiac catheterization is not indicated.  Recommend initiation of amiodarone infusion to prevent recurrent arrhythmia.  Check digoxin level.  Continue metoprolol succinate 50 mg twice daily.  Given that there is no ICU bed currently available at [facility] nor do we have EP back-up in-house, I recommend transfer to [facility], where the patient receives his EP care.  Given that Mr. Blasco follows with Dr. Welton Flakes as an outpatient, I will defer further cardiology evaluation and recommendations to him pending transfer to [facility].  Coronary artery disease I favor slight troponin elevation and left arm pain after extended duration of wide-complex tachycardia representing demand ischemia rather than primary acute coronary syndrome.  Recommend trending troponins until they have peaked, then stop.  Continue aggressive secondary prevention. Ultimately, patient may need ischemia evaluation during this hospitalization, though I do not believe that emergent catheterization is indicated at this time.   Chronic systolic heart failure due to ischemic cardiomyopathy Mr. Buckwalter appears euvolemic on exam.  Continue evidence-based heart failure therapy, including metoprolol and spironolactone.  It is uncertain why he is not on an ACE inhibitor or ARB, though an indeterminate allergy to captopril is noted in the chart.  Check digoxin level.  Paroxysmal atrial fibrillation EKG today is not consistent with atrial fibrillation.  Most recent EP note from Dr. Maisie Fus in 02/2018 indicates that Mr. Stelly had been taking apixaban, though he has a history of GI bleed.  It is not on his medication list today nor does Mr. Greenstreet state that he is taking any anticoagulation other than aspirin.  Continue amiodarone and metoprolol.  Further clarification of anticoagulation per Dr. Welton Flakes and [facility].  For questions or updates, please contact [facility] Please consult [email or web address] for contact info under [facility].  Signed, Yvonne Kendall, MD  06/10/2018 7:39 PM

## 2018-06-10 NOTE — ED Notes (Addendum)
Dr. End at bedside. 

## 2018-06-10 NOTE — ED Notes (Signed)
Pt given Adenosine 6 mg and Adenosine 12 mg without change in rhythm. Per Dr. Okey Dupre pt to be cardioverted.  Dr. Marisa Severin ordered medications for sedation. Fentanyl 100 mg and Versed 4 mg.

## 2018-06-10 NOTE — ED Triage Notes (Signed)
Pt to ED via [facility] from home for possible STEMI. EMS called STEMI in field. EMS reports they were called out for possible seizure, pt does not have hx/o same. Pt has defib/pacer. Pt has been tachy in the 150's. Pt has had persistent vomiting with EMS. Pt was given Zofran 4 mg and 3 81 mg ASA. Pt last BP per EMS was 109/68. Pt pale on arrival to ED.

## 2018-06-10 NOTE — ED Notes (Signed)
X-ray at bedside

## 2018-06-10 NOTE — ED Notes (Signed)
Dr. Okey Dupre at bedside with [facility].

## 2018-06-10 NOTE — ED Notes (Signed)
[facility] at bedside. Report given

## 2018-06-10 NOTE — Progress Notes (Signed)
   06/10/18 1805  Clinical Encounter Type  Visited With Patient;Patient and family together  Visit Type Initial;Psychological support;Code  Referral From Nurse  Consult/Referral To Chaplain  Spiritual Encounters  Spiritual Needs Emotional  Patient was receiving medical attention from care team upon arrival. Patient appeared lethargic. Eyes were closed but was able to answer questions. [facility] provided pastoral care by being a non-anxious presence. Assisted patient's wife from lobby. No requests were made known from patient or spouse.

## 2018-06-10 NOTE — ED Notes (Signed)
Dr. Okey Dupre at bedside with [facility]. Pt still in V-tach rhythm with rate in 140's-150's. Pt has good color. Pt still c/o left arm pain.

## 2018-06-10 NOTE — Sedation Documentation (Signed)
Pt cardioverted using 200J. Pt converted to Sinus Rhythm with rate in the 60's. Dr. Okey Dupre and Dr. Marisa Severin in agreement that pt is not code STEMI

## 2018-07-05 ENCOUNTER — Other Ambulatory Visit: Payer: Self-pay

## 2018-07-05 ENCOUNTER — Emergency Department
Admission: EM | Admit: 2018-07-05 | Discharge: 2018-07-05 | Disposition: A | Discharge status: Other Acute Inpatient Hospital | Payer: Medicare HMO | Attending: Emergency Medicine | Admitting: Emergency Medicine

## 2018-07-05 ENCOUNTER — Emergency Department: Payer: Medicare HMO

## 2018-07-05 DIAGNOSIS — I509 Heart failure, unspecified: Secondary | ICD-10-CM | POA: Diagnosis not present

## 2018-07-05 DIAGNOSIS — I472 Ventricular tachycardia, unspecified: Secondary | ICD-10-CM

## 2018-07-05 DIAGNOSIS — Z951 Presence of aortocoronary bypass graft: Secondary | ICD-10-CM | POA: Insufficient documentation

## 2018-07-05 DIAGNOSIS — R079 Chest pain, unspecified: Secondary | ICD-10-CM | POA: Diagnosis present

## 2018-07-05 DIAGNOSIS — F17200 Nicotine dependence, unspecified, uncomplicated: Secondary | ICD-10-CM | POA: Diagnosis not present

## 2018-07-05 DIAGNOSIS — I110 Hypertensive heart disease with heart failure: Secondary | ICD-10-CM | POA: Insufficient documentation

## 2018-07-05 DIAGNOSIS — I2510 Atherosclerotic heart disease of native coronary artery without angina pectoris: Secondary | ICD-10-CM | POA: Diagnosis not present

## 2018-07-05 DIAGNOSIS — Z9889 Other specified postprocedural states: Secondary | ICD-10-CM

## 2018-07-05 DIAGNOSIS — J449 Chronic obstructive pulmonary disease, unspecified: Secondary | ICD-10-CM | POA: Insufficient documentation

## 2018-07-05 DIAGNOSIS — E119 Type 2 diabetes mellitus without complications: Secondary | ICD-10-CM | POA: Diagnosis not present

## 2018-07-05 LAB — BASIC METABOLIC PANEL
Anion gap: 8 (ref 5–15)
BUN: 15 mg/dL (ref 8–23)
CHLORIDE: 106 mmol/L (ref 98–111)
CO2: 23 mmol/L (ref 22–32)
Calcium: 7.8 mg/dL — ABNORMAL LOW (ref 8.9–10.3)
Creatinine, Ser: 1.27 mg/dL — ABNORMAL HIGH (ref 0.61–1.24)
GFR calc Af Amer: >60 mL/min (ref >60)
GFR calc non Af Amer: 54 mL/min — ABNORMAL LOW (ref >60)
GLUCOSE: 261 mg/dL — AB (ref 70–99)
Potassium: 3.7 mmol/L (ref 3.5–5.1)
Sodium: 137 mmol/L (ref 135–145)

## 2018-07-05 LAB — CBC
HEMATOCRIT: 36.1 % — AB (ref 39.0–52.0)
HEMOGLOBIN: 11.2 g/dL — AB (ref 13.0–17.0)
MCH: 26.9 pg (ref 26.0–34.0)
MCHC: 31 g/dL (ref 30.0–36.0)
MCV: 86.8 fL (ref 80.0–100.0)
Platelets: 218 10*3/uL (ref 150–400)
RBC: 4.16 MIL/uL — AB (ref 4.22–5.81)
RDW: 17.1 % — AB (ref 11.5–15.5)
WBC: 9.1 10*3/uL (ref 4.0–10.5)
nRBC: 0 % (ref 0.0–0.2)

## 2018-07-05 LAB — TROPONIN I: TROPONIN I: 0.04 ng/mL — AB (ref <0.03)

## 2018-07-05 MED ORDER — MIDAZOLAM HCL 2 MG/2ML IJ SOLN
2.0000 mg | Freq: Once | INTRAMUSCULAR | Status: AC
  Administered 2018-07-05: 2 mg via INTRAVENOUS

## 2018-07-05 MED ORDER — FENTANYL CITRATE (PF) 100 MCG/2ML IJ SOLN
INTRAMUSCULAR | Status: AC
  Filled 2018-07-05: qty 2

## 2018-07-05 MED ORDER — SODIUM CHLORIDE 0.9 % IV BOLUS
500.0000 mL | Freq: Once | INTRAVENOUS | Status: AC
  Administered 2018-07-05: 500 mL via INTRAVENOUS

## 2018-07-05 MED ORDER — AMIODARONE HCL IN DEXTROSE 360-4.14 MG/200ML-% IV SOLN: 30.0000 mg/h | INTRAVENOUS | Status: DC

## 2018-07-05 MED ORDER — MIDAZOLAM HCL 5 MG/5ML IJ SOLN
INTRAMUSCULAR | Status: AC
  Administered 2018-07-05: 2 mg via INTRAVENOUS
  Filled 2018-07-05: qty 5

## 2018-07-05 MED ORDER — AMIODARONE HCL IN DEXTROSE 360-4.14 MG/200ML-% IV SOLN
60.0000 mg/h | INTRAVENOUS | Status: DC
  Administered 2018-07-05: 60 mg/h via INTRAVENOUS
  Filled 2018-07-05: qty 200

## 2018-07-05 NOTE — ED Notes (Signed)
Wife attempted to call x2.

## 2018-07-05 NOTE — ED Provider Notes (Addendum)
[facility] Emergency Department Provider Note       Time seen: ----------------------------------------- 12:41 PM on 07/05/2018 -----------------------------------------   I have reviewed the triage vital signs and the nursing notes.  HISTORY   Chief Complaint Chest Pain and Nausea   HPI [name] is a 72 y.o. male with a history of AICD, CHF, COPD, coronary disease, diabetes, GERD, hypertension, hyperlipidemia, MI, pneumonia who presents to the ED for not feeling well with chest pain and nausea.  He was noted to be in V. tach by EMS.  EMS gave him amiodarone IV with no improvement.  Initial blood pressure was in the 90 systolic.  Patient states symptom onset was sudden.  Past Medical History:  Diagnosis Date  . AICD (automatic cardioverter/defibrillator) present 2010   battery changed out 2015  . Anginal pain (HCC)    infrequent pain. usually feels as pain in the arms  . Bilateral swelling of feet   . CHF (congestive heart failure) (HCC)   . Chronic cough   . Chronic kidney disease    renal insufficiency.started on spironalactone  . COPD (chronic obstructive pulmonary disease) (HCC)    does not use o2  . Coronary artery disease   . Diabetes mellitus without complication (HCC)   . Dysrhythmia   . GERD (gastroesophageal reflux disease)   . Hepatitis   . Hypercholesteremia   . Hypertension    history of,   . Hyponatremia   . Myocardial infarction (HCC) 1993   several. per patient, has had 10 heart attacks  . Pancreatic pseudocyst   . Pneumonia   . Presence of permanent cardiac pacemaker    and defibrilator    Patient Active Problem List   Diagnosis Date Noted  . Coffee ground emesis 03/09/2018  . Shingles 03/09/2018  . UGIB (upper gastrointestinal bleed)   . PNA (pneumonia) 02/28/2018  . Ventricular tachycardia (HCC) 02/13/2018  . Protein-calorie malnutrition, severe 02/13/2018  . Hyperlipidemia 05/17/2017  . Hypertension  04/21/2017  . Diabetes mellitus without complication (HCC) 04/21/2017  . Tobacco use disorder 04/21/2017  . Atherosclerosis of native arteries of extremity with intermittent claudication (HCC) 04/21/2017    Past Surgical History:  Procedure Laterality Date  . CARDIAC CATHETERIZATION     with stents  . CARDIAC DEFIBRILLATOR PLACEMENT  2010  . CATARACT EXTRACTION W/PHACO Left 02/23/2016   Procedure: CATARACT EXTRACTION PHACO AND INTRAOCULAR LENS PLACEMENT (IOC);  Surgeon: Galen Manila, MD;  Location: [facility] ORS;  Service: Ophthalmology;  Laterality: Left;  [place] 1.05 AP% 22.9 CDE 15.02 Fluid pack lot # [id number]  . CHOLECYSTECTOMY  2014  . CORONARY ARTERY BYPASS GRAFT    . ERCP W/ SPHICTEROTOMY    . ESOPHAGOGASTRODUODENOSCOPY (EGD) WITH PROPOFOL N/A 04/24/2018   Procedure: ESOPHAGOGASTRODUODENOSCOPY (EGD) WITH PROPOFOL;  Surgeon: Toledo, Boykin Nearing, MD;  Location: [facility] ENDOSCOPY;  Service: Gastroenterology;  Laterality: N/A;  . feeding tube removal  2014  . GASTROSTOMY W/ FEEDING TUBE  2014   after galbladder surgery.  . LOWER EXTREMITY ANGIOGRAPHY Left 04/26/2017   Procedure: Lower Extremity Angiography;  Surgeon: Annice Needy, MD;  Location: [facility] INVASIVE CV LAB;  Service: Cardiovascular;  Laterality: Left;  . LOWER EXTREMITY ANGIOGRAPHY Right 05/25/2017   Procedure: Lower Extremity Angiography;  Surgeon: Annice Needy, MD;  Location: [facility] INVASIVE CV LAB;  Service: Cardiovascular;  Laterality: Right;  . PANCREATIC CYST EXCISION  2014   had hepatitis at this time  . STENT PLACEMENT NON-VASCULAR ([facility] HX)  cardiac stents x many    Allergies Other; Captopril; and Adhesive [tape]  Social History Social History   Tobacco Use  . Smoking status: Current Every Day Smoker    Packs/day: 1.00    Years: 55.00    Pack years: 55.00  . Smokeless tobacco: Never Used  Substance Use Topics  . Alcohol use: No    Comment: rare  . Drug use: No   Review of Systems Constitutional: Negative  for fever. Cardiovascular: Positive for chest pain, arrhythmia Respiratory: Negative for shortness of breath. Gastrointestinal: Negative for abdominal pain, positive for nausea Musculoskeletal: Negative for back pain. Skin: Negative for rash. Neurological: Negative for headaches, focal weakness or numbness.  All systems negative/normal/unremarkable except as stated in the HPI  ____________________________________________   PHYSICAL EXAM:  VITAL SIGNS: ED Triage Vitals  Enc Vitals Group     BP 07/05/18 1209 119/86     Pulse Rate 07/05/18 1219 60     Resp 07/05/18 1209 11     Temp --      Temp src --      SpO2 07/05/18 1219 99 %     Weight --      Height --      Head Circumference --      Peak Flow --      Pain Score 07/05/18 1224 0     Pain Loc --      Pain Edu? --      Excl. in GC? --    Constitutional: Alert and oriented.  Mild distress Eyes: Conjunctivae are normal. Normal extraocular movements. ENT   Head: Normocephalic and atraumatic.   Nose: No congestion/rhinnorhea.   Mouth/Throat: Mucous membranes are moist.   Neck: No stridor. Cardiovascular: Very rapid rate.  Murmur is noted Respiratory: Normal respiratory effort without tachypnea nor retractions. Breath sounds are clear and equal bilaterally.  Gastrointestinal: Soft and nontender. Normal bowel sounds Musculoskeletal: Nontender with normal range of motion in extremities. No lower extremity tenderness nor edema. Neurologic:  Normal speech and language. No gross focal neurologic deficits are appreciated.  Skin:  Skin is warm, dry and intact. No rash noted. Psychiatric: Mood and affect are normal. Speech and behavior are normal.  ____________________________________________  EKG: Interpreted by me.  Ventricular tachycardia is noted with a rate in the 150s, no pacemaker spikes are evident  Repeat EKG interpreted by me post cardioversion: Atrial ventricular dual paced rhythm with a rate of 60 bpm,  normal pacemaker function at this time. ____________________________________________  ED COURSE:  As part of my medical decision making, I reviewed the following data within the electronic MEDICAL RECORD NUMBER History obtained from family if available, nursing notes, old chart and ekg, as well as notes from prior ED visits. Patient presented for ventricular tachycardia, we will assess with labs and imaging as indicated at this time.  Patient suddenly became hypotensive requiring emergent cardioversion   .Cardioversion Date/Time: 07/05/2018 12:44 PM Performed by: [name], Jonathan E, MD Authorized by: [name], Jonathan E, MD   Consent:    Consent obtained:  Emergent situation   Consent given by:  Patient Pre-procedure details:    Cardioversion basis:  Emergent   Rhythm:  Ventricular tachycardia Patient sedated: Yes. Refer to sedation procedure documentation for details of sedation.  Attempt one:    Cardioversion mode:  Synchronous   Waveform:  Biphasic   Shock (Joules):  100   Shock outcome:  Conversion to normal sinus rhythm Post-procedure details:    Patient tolerance of procedure:  Tolerated well, no immediate complications .Sedation Date/Time: 07/05/2018 12:44 PM Performed by: Emily Filbert, MD Authorized by: Emily Filbert, MD   Consent:    Consent obtained:  Verbal and emergent situation Universal protocol:    Immediately prior to procedure a time out was called: yes   Indications:    Procedure performed:  Cardioversion Pre-sedation assessment:    Time since last food or drink:  Unknown   ASA classification: class 3 - patient with severe systemic disease     Neck mobility: normal     Mallampati score:  III - soft palate, base of uvula visible   Pre-sedation assessments completed and reviewed: airway patency and mental status     Pre-sedation assessment completed:  07/05/2018 2:05 PM Immediate pre-procedure details:    Reassessment: Patient reassessed  immediately prior to procedure     Reviewed: vital signs     Verified: bag valve mask available, oxygen available and suction available   Procedure details (see MAR for exact dosages):    Preoxygenation:  Nasal cannula   Sedation:  Midazolam   Analgesia:  Fentanyl   Intra-procedure monitoring:  Blood pressure monitoring and cardiac monitor   Intra-procedure events: none     Total Provider sedation time (minutes):  5 Post-procedure details:    Post-sedation assessment completed:  07/05/2018 12:46 PM   Post-sedation assessments completed and reviewed: airway patency, cardiovascular function and mental status     Patient tolerance:  Tolerated well, no immediate complications  ____________________________________________   LABS (pertinent positives/negatives)  Labs Reviewed  BASIC METABOLIC PANEL - Abnormal; Notable for the following components:      Result Value   Glucose, Bld 261 (*)    Creatinine, Ser 1.27 (*)    Calcium 7.8 (*)    GFR calc non Af Amer 54 (*)    All other components within normal limits  CBC - Abnormal; Notable for the following components:   RBC 4.16 (*)    Hemoglobin 11.2 (*)    HCT 36.1 (*)    RDW 17.1 (*)    All other components within normal limits  TROPONIN I - Abnormal; Notable for the following components:   Troponin I 0.04 (*)    All other components within normal limits   CRITICAL CARE Performed by: Ulice Dash   Total critical care time: 30 minutes  Critical care time was exclusive of separately billable procedures and treating other patients.  Critical care was necessary to treat or prevent imminent or life-threatening deterioration.  Critical care was time spent personally by me on the following activities: development of treatment plan with patient and/or surrogate as well as nursing, discussions with consultants, evaluation of patient's response to treatment, examination of patient, obtaining history from patient or surrogate,  ordering and performing treatments and interventions, ordering and review of laboratory studies, ordering and review of radiographic studies, pulse oximetry and re-evaluation of patient's condition.  RADIOLOGY Images were viewed by me  Chest x-ray IMPRESSION: Cardiomegaly. Pacemaker/AICD. Pulmonary venous hypertension without frank edema. ____________________________________________  DIFFERENTIAL DIAGNOSIS   Ventricular tachycardia, electrolyte abnormality, medication noncompliance, MI  FINAL ASSESSMENT AND PLAN  V. tach, cardioversion   Plan: The patient had presented for persistent V. tach who suddenly became hypotensive requiring emergent cardioversion with success. Patient's labs reveal a mildly elevated troponin which is chronic. Patient's imaging did not reveal any acute process.  He tolerated the cardioversion well.  I will discuss with [facility].   Ulice Dash, MD  Note: This note was generated in part or whole with voice recognition software. Voice recognition is usually quite accurate but there are transcription errors that can and very often do occur. I apologize for any typographical errors that were not detected and corrected.     Emily Filbert, MD 07/05/18 1406    Emily Filbert, MD 07/16/18 0800

## 2018-07-05 NOTE — ED Notes (Signed)
Patient left with [facility] at this time. Amiodarone running. Patient left in no distress.

## 2018-07-05 NOTE — ED Notes (Signed)
EMTALA reviewed by this RN.  

## 2018-07-05 NOTE — ED Provider Notes (Signed)
[facility]  He is awake and alert with no complaints at this time.   [name],  E, MD 07/05/18 [phone]

## 2018-07-05 NOTE — ED Notes (Addendum)
At patient request, attempted to call wife. No answer .

## 2018-07-05 NOTE — ED Triage Notes (Signed)
Patient arrives from EMS. Patient was not feeling well, chest pain, nausea. VTACH 150s for EMS. 150mg  amiodarone with no change. BP 90/50. 22G IV into left arm

## 2018-07-05 NOTE — ED Notes (Signed)
Date and time results received: 07/05/18 1329 (use smartphrase ".now" to insert current time)  Test: Troponin Critical Value: 0.04  Name of Provider Notified: Dr. Mayford KnifeWilliams  Orders Received? Or Actions Taken?: Provider aware

## 2018-07-05 NOTE — ED Notes (Addendum)
Dr. Mayford KnifeWilliams at bedside. Ambu bag in placed, code cart at bedside and pads placed on patient. 2mg  versed given at 12:17pm by Jeannett SeniorStephen, RN 12:18pm, patient shocked by synchronized cardioversion at 100 joules. Patient in a paced rhythm at this time.

## 2018-07-05 NOTE — ED Notes (Signed)
ETCO2 placed

## 2018-07-05 NOTE — ED Notes (Signed)
Pacemaker interrogated, [employer] called.

## 2018-08-21 MED ORDER — INSULIN NPH (HUMAN) (ISOPHANE) 100 UNIT/ML ~~LOC~~ SUSP
8.00 | SUBCUTANEOUS | Status: DC
Start: 2018-08-21 — End: 2018-08-21

## 2018-08-21 MED ORDER — LIDOCAINE HCL (PF) 1 % IJ SOLN
.50 | INTRAMUSCULAR | Status: DC
Start: ? — End: 2018-08-21

## 2018-08-21 MED ORDER — APIXABAN 2.5 MG PO TABS
2.50 | ORAL_TABLET | ORAL | Status: DC
Start: 2018-08-21 — End: 2018-08-21

## 2018-08-21 MED ORDER — GENERIC EXTERNAL MEDICATION
40.00 | Status: DC
Start: 2018-08-21 — End: 2018-08-21

## 2018-08-21 MED ORDER — DIPHENHYDRAMINE HCL 25 MG PO CAPS
25.00 | ORAL_CAPSULE | ORAL | Status: DC
Start: ? — End: 2018-08-21

## 2018-08-21 MED ORDER — ACETAMINOPHEN 325 MG PO TABS
650.00 | ORAL_TABLET | ORAL | Status: DC
Start: ? — End: 2018-08-21

## 2018-08-21 MED ORDER — COLESTIPOL HCL 5 G PO PACK
5.00 | PACK | ORAL | Status: DC
Start: 2018-08-21 — End: 2018-08-21

## 2018-08-21 MED ORDER — GENERIC EXTERNAL MEDICATION
6.25 | Status: DC
Start: ? — End: 2018-08-21

## 2018-08-21 MED ORDER — MELATONIN 3 MG PO TABS
3.00 | ORAL_TABLET | ORAL | Status: DC
Start: 2018-08-21 — End: 2018-08-21

## 2018-08-21 MED ORDER — GENERIC EXTERNAL MEDICATION
1.00 | Status: DC
Start: 2018-08-21 — End: 2018-08-21

## 2018-08-21 MED ORDER — SPIRONOLACTONE 25 MG PO TABS
25.00 | ORAL_TABLET | ORAL | Status: DC
Start: 2018-08-22 — End: 2018-08-21

## 2018-08-21 MED ORDER — PRAVASTATIN SODIUM 20 MG PO TABS
40.00 | ORAL_TABLET | ORAL | Status: DC
Start: 2018-08-21 — End: 2018-08-21

## 2018-08-21 MED ORDER — HYDROCORTISONE 1 % EX CREA
TOPICAL_CREAM | CUTANEOUS | Status: DC
Start: ? — End: 2018-08-21

## 2018-08-21 MED ORDER — GLUCAGON HCL RDNA (DIAGNOSTIC) 1 MG IJ SOLR
1.00 | INTRAMUSCULAR | Status: DC
Start: ? — End: 2018-08-21

## 2018-08-21 MED ORDER — MEXILETINE HCL 150 MG PO CAPS
150.00 | ORAL_CAPSULE | ORAL | Status: DC
Start: 2018-08-21 — End: 2018-08-21

## 2018-08-21 MED ORDER — AMIODARONE HCL 200 MG PO TABS
200.00 | ORAL_TABLET | ORAL | Status: DC
Start: 2018-08-21 — End: 2018-08-21

## 2018-08-21 MED ORDER — SUCRALFATE 1 GM/10ML PO SUSP
1.00 | ORAL | Status: DC
Start: 2018-08-21 — End: 2018-08-21

## 2018-08-21 MED ORDER — DEXTROSE 50 % IV SOLN
12.50 | INTRAVENOUS | Status: DC
Start: ? — End: 2018-08-21

## 2018-08-21 MED ORDER — INSULIN REGULAR HUMAN 100 UNIT/ML IJ SOLN
0.00 | INTRAMUSCULAR | Status: DC
Start: 2018-08-21 — End: 2018-08-21

## 2018-08-21 MED ORDER — POLYETHYLENE GLYCOL 3350 17 G PO PACK
17.00 | PACK | ORAL | Status: DC
Start: 2018-08-22 — End: 2018-08-21

## 2018-08-21 MED ORDER — METOPROLOL SUCCINATE ER 50 MG PO TB24
50.00 | ORAL_TABLET | ORAL | Status: DC
Start: 2018-08-21 — End: 2018-08-21

## 2018-08-21 MED ORDER — ONDANSETRON HCL 4 MG/2ML IJ SOLN
4.00 | INTRAMUSCULAR | Status: DC
Start: ? — End: 2018-08-21

## 2018-09-17 ENCOUNTER — Emergency Department
Admission: EM | Admit: 2018-09-17 | Discharge: 2018-09-17 | Disposition: A | Discharge status: Home / Self Care | Payer: Medicare HMO | Attending: Emergency Medicine | Admitting: Emergency Medicine

## 2018-09-17 ENCOUNTER — Emergency Department: Payer: Medicare HMO

## 2018-09-17 ENCOUNTER — Encounter: Payer: Self-pay | Admitting: Emergency Medicine

## 2018-09-17 ENCOUNTER — Other Ambulatory Visit: Payer: Self-pay

## 2018-09-17 DIAGNOSIS — Z7982 Long term (current) use of aspirin: Secondary | ICD-10-CM | POA: Insufficient documentation

## 2018-09-17 DIAGNOSIS — F17210 Nicotine dependence, cigarettes, uncomplicated: Secondary | ICD-10-CM | POA: Insufficient documentation

## 2018-09-17 DIAGNOSIS — Z951 Presence of aortocoronary bypass graft: Secondary | ICD-10-CM | POA: Diagnosis not present

## 2018-09-17 DIAGNOSIS — I252 Old myocardial infarction: Secondary | ICD-10-CM | POA: Insufficient documentation

## 2018-09-17 DIAGNOSIS — Z79899 Other long term (current) drug therapy: Secondary | ICD-10-CM | POA: Insufficient documentation

## 2018-09-17 DIAGNOSIS — J90 Pleural effusion, not elsewhere classified: Secondary | ICD-10-CM

## 2018-09-17 DIAGNOSIS — I130 Hypertensive heart and chronic kidney disease with heart failure and stage 1 through stage 4 chronic kidney disease, or unspecified chronic kidney disease: Secondary | ICD-10-CM | POA: Diagnosis not present

## 2018-09-17 DIAGNOSIS — Z794 Long term (current) use of insulin: Secondary | ICD-10-CM | POA: Insufficient documentation

## 2018-09-17 DIAGNOSIS — N189 Chronic kidney disease, unspecified: Secondary | ICD-10-CM | POA: Diagnosis not present

## 2018-09-17 DIAGNOSIS — I2510 Atherosclerotic heart disease of native coronary artery without angina pectoris: Secondary | ICD-10-CM | POA: Diagnosis not present

## 2018-09-17 DIAGNOSIS — I509 Heart failure, unspecified: Secondary | ICD-10-CM | POA: Diagnosis not present

## 2018-09-17 DIAGNOSIS — E1122 Type 2 diabetes mellitus with diabetic chronic kidney disease: Secondary | ICD-10-CM | POA: Diagnosis not present

## 2018-09-17 DIAGNOSIS — R531 Weakness: Secondary | ICD-10-CM | POA: Insufficient documentation

## 2018-09-17 LAB — BASIC METABOLIC PANEL
ANION GAP: 9 (ref 5–15)
BUN: 25 mg/dL — ABNORMAL HIGH (ref 8–23)
CO2: 22 mmol/L (ref 22–32)
Calcium: 9 mg/dL (ref 8.9–10.3)
Chloride: 103 mmol/L (ref 98–111)
Creatinine, Ser: 1.86 mg/dL — ABNORMAL HIGH (ref 0.61–1.24)
GFR calc non Af Amer: 35 mL/min — ABNORMAL LOW (ref >60)
GFR, EST AFRICAN AMERICAN: 41 mL/min — AB (ref >60)
Glucose, Bld: 148 mg/dL — ABNORMAL HIGH (ref 70–99)
Potassium: 4.4 mmol/L (ref 3.5–5.1)
Sodium: 134 mmol/L — ABNORMAL LOW (ref 135–145)

## 2018-09-17 LAB — CBC
HCT: 36.6 % — ABNORMAL LOW (ref 39.0–52.0)
Hemoglobin: 11.1 g/dL — ABNORMAL LOW (ref 13.0–17.0)
MCH: 24.8 pg — ABNORMAL LOW (ref 26.0–34.0)
MCHC: 30.3 g/dL (ref 30.0–36.0)
MCV: 81.9 fL (ref 80.0–100.0)
Platelets: 247 10*3/uL (ref 150–400)
RBC: 4.47 MIL/uL (ref 4.22–5.81)
RDW: 18 % — ABNORMAL HIGH (ref 11.5–15.5)
WBC: 8.5 10*3/uL (ref 4.0–10.5)
nRBC: 0 % (ref 0.0–0.2)

## 2018-09-17 MED ORDER — SODIUM CHLORIDE 0.9% FLUSH: 3.0000 mL | Freq: Once | INTRAVENOUS | Status: DC

## 2018-09-17 NOTE — ED Provider Notes (Signed)
[facility] Emergency Department Provider Note   ____________________________________________   I have reviewed the triage vital signs and the nursing notes.   HISTORY  Chief Complaint Weakness   History limited by: Not Limited   HPI NAHOME [name] is a 73 y.o. male who presents to the emergency department today because of concern for weakness. Patient states that he has been weak for a little over a month.  He states that today he felt like he was going to fall.  He he does state that he has had some falls recently.  Does not sound like there is any acute change in his symptoms today.  He is complaining also of some difficulty holding food down but has also had this for a little while.  Had an admission at an outside hospital where he was having abdominal pain.  He states that he also had heart failure at that time.  He has gained some weight since being discharged.  He states he has been taking his medication as directed.  He denies any fevers.   Per medical record review patient has a history of CHF  Past Medical History:  Diagnosis Date  . AICD (automatic cardioverter/defibrillator) present 2010   battery changed out 2015  . Anginal pain (HCC)    infrequent pain. usually feels as pain in the arms  . Bilateral swelling of feet   . CHF (congestive heart failure) (HCC)   . Chronic cough   . Chronic kidney disease    renal insufficiency.started on spironalactone  . COPD (chronic obstructive pulmonary disease) (HCC)    does not use o2  . Coronary artery disease   . Diabetes mellitus without complication (HCC)   . Dysrhythmia   . GERD (gastroesophageal reflux disease)   . Hepatitis   . Hypercholesteremia   . Hypertension    history of,   . Hyponatremia   . Myocardial infarction (HCC) 1993   several. per patient, has had 10 heart attacks  . Pancreatic pseudocyst   . Pneumonia   . Presence of permanent cardiac pacemaker    and defibrilator     Patient Active Problem List   Diagnosis Date Noted  . Coffee ground emesis 03/09/2018  . Shingles 03/09/2018  . UGIB (upper gastrointestinal bleed)   . PNA (pneumonia) 02/28/2018  . Ventricular tachycardia (HCC) 02/13/2018  . Protein-calorie malnutrition, severe 02/13/2018  . Hyperlipidemia 05/17/2017  . Hypertension 04/21/2017  . Diabetes mellitus without complication (HCC) 04/21/2017  . Tobacco use disorder 04/21/2017  . Atherosclerosis of native arteries of extremity with intermittent claudication (HCC) 04/21/2017    Past Surgical History:  Procedure Laterality Date  . CARDIAC CATHETERIZATION     with stents  . CARDIAC DEFIBRILLATOR PLACEMENT  2010  . CATARACT EXTRACTION W/PHACO Left 02/23/2016   Procedure: CATARACT EXTRACTION PHACO AND INTRAOCULAR LENS PLACEMENT (IOC);  Surgeon: Galen Manila, MD;  Location: [facility] ORS;  Service: Ophthalmology;  Laterality: Left;  [place] 1.05 AP% 22.9 CDE 15.02 Fluid pack lot # [id number]  . CHOLECYSTECTOMY  2014  . CORONARY ARTERY BYPASS GRAFT    . ERCP W/ SPHICTEROTOMY    . ESOPHAGOGASTRODUODENOSCOPY (EGD) WITH PROPOFOL N/A 04/24/2018   Procedure: ESOPHAGOGASTRODUODENOSCOPY (EGD) WITH PROPOFOL;  Surgeon: Toledo, Boykin Nearing, MD;  Location: [facility] ENDOSCOPY;  Service: Gastroenterology;  Laterality: N/A;  . feeding tube removal  2014  . GASTROSTOMY W/ FEEDING TUBE  2014   after galbladder surgery.  . LOWER EXTREMITY ANGIOGRAPHY Left 04/26/2017   Procedure: Lower Extremity  Angiography;  Surgeon: Annice Needyew, Jason S, MD;  [facility];  Service: Cardiovascular;  Laterality: Left;  . LOWER EXTREMITY ANGIOGRAPHY Right 05/25/2017   Procedure: Lower Extremity Angiography;  Surgeon: Annice Needyew, Jason S, MD;  Location: [facility] INVASIVE CV LAB;  Service: Cardiovascular;  Laterality: Right;  . PANCREATIC CYST EXCISION  2014   had hepatitis at this time  . STENT PLACEMENT NON-VASCULAR ([facility] HX)     cardiac stents x many    Prior to Admission  medications   Medication Sig Start Date End Date Taking? Authorizing Provider  amiodarone (PACERONE) 400 MG tablet Take 1 tablet (400 mg total) by mouth daily. 02/15/18   Enedina FinnerPatel, Sona, MD  aspirin EC 81 MG tablet Take 81 mg by mouth daily.    [provider]  digoxin (LANOXIN) 0.125 MG tablet Take 0.125 mg by mouth daily.    [provider]  furosemide (LASIX) 20 MG tablet Take 20 mg by mouth daily as needed for fluid.     [provider]  guaiFENesin-dextromethorphan (ROBITUSSIN DM) 100-10 MG/5ML syrup Take 5 mLs by mouth every 4 (four) hours as needed for cough. 03/02/18   Shaune Pollackhen, Qing, MD  insulin NPH Human (HUMULIN N,NOVOLIN N) 100 UNIT/ML injection Inject 0.18 mLs (18 Units total) into the skin 2 (two) times daily before a meal. Patient taking differently: Inject 15 Units into the skin 2 (two) times daily before a meal.  02/14/18   Enedina FinnerPatel, Sona, MD  Melatonin 5 MG TABS Take 10 mg by mouth at bedtime.     [provider]  metoprolol succinate (TOPROL-XL) 50 MG 24 hr tablet Take 50 mg by mouth 2 (two) times daily.    [provider]  Multiple Vitamin (MULTIVITAMIN) capsule Take 1 capsule by mouth daily.    [provider]  nitroGLYCERIN (NITRODUR - DOSED IN MG/24 HR) 0.1 mg/hr patch Place 1 patch (0.1 mg total) onto the skin daily as needed. Patient taking differently: Place 0.1 mg onto the skin daily as needed (chest pain).  02/14/18   Enedina FinnerPatel, Sona, MD  nitroGLYCERIN (NITROSTAT) 0.4 MG SL tablet Place 0.4 mg under the tongue every 5 (five) minutes as needed for chest pain.    [provider]  omeprazole (PRILOSEC) 40 MG capsule Take 40 mg by mouth daily.    [provider]  ondansetron [facility](ZOFRAN) 4 MG/5ML solution Take by mouth every 8 (eight) hours as needed for nausea or vomiting.    [provider]  pantoprazole (PROTONIX) 40 MG tablet Take 1 tablet (40 mg total) by mouth daily. 03/11/18 04/10/18  Ihor AustinPyreddy, Pavan, MD   pravastatin (PRAVACHOL) 40 MG tablet Take 40 mg by mouth at bedtime.    [provider]  spironolactone (ALDACTONE) 25 MG tablet Take 25 mg by mouth every morning before breakfast. 05/17/16   [provider]  triamcinolone cream (KENALOG) 0.5 % Apply 1 application topically 2 (two) times daily. 01/17/18   [provider]  vitamin B-12 (CYANOCOBALAMIN) 1000 MCG tablet Take 1,000 mcg by mouth 2 (two) times daily.    [provider]    Allergies Other; Captopril; and Adhesive [tape]  Family History  Problem Relation Age of Onset  . Heart attack Mother   . Cancer Brother   . CAD Brother     Social History Social History   Tobacco Use  . Smoking status: Current Every Day Smoker    Packs/day: 1.00    Years: 55.00    Pack years:  55.00  . Smokeless tobacco: Never Used  Substance Use Topics  . Alcohol use: No    Comment: rare  . Drug use: No    Review of Systems Constitutional: No fever/chills. Positive for generalized weakness. Eyes: No visual changes. ENT: No sore throat. Cardiovascular: Denies chest pain. Respiratory: Denies shortness of breath. Gastrointestinal: No abdominal pain.  No nausea, no vomiting.  No diarrhea.   Genitourinary: Negative for dysuria. Musculoskeletal: Negative for back pain. Skin: Negative for rash. Neurological: Negative for headaches, focal weakness or numbness.  ____________________________________________   PHYSICAL EXAM:  VITAL SIGNS: ED Triage Vitals  Enc Vitals Group     BP 09/17/18 1526 116/72     Pulse Rate 09/17/18 1526 70     Resp 09/17/18 1526 18     Temp 09/17/18 1526 (!) 97.4 F (36.3 C)     Temp src --      SpO2 09/17/18 1526 98 %     Weight 09/17/18 1528 130 lb (59 kg)     Height 09/17/18 1528 5\' 5"  (1.651 m)     Head Circumference --      Peak Flow --      Pain Score 09/17/18 1528 0    Constitutional: Alert and oriented.  Eyes: Conjunctivae are normal.  ENT      Head:  Normocephalic and atraumatic.      Nose: No congestion/rhinnorhea.      Mouth/Throat: Mucous membranes are moist.      Neck: No stridor. Hematological/Lymphatic/Immunilogical: No cervical lymphadenopathy. Cardiovascular: Normal rate, regular rhythm.  No murmurs, rubs, or gallops.  Respiratory: Normal respiratory effort without tachypnea nor retractions. Breath sounds are clear and equal bilaterally. No wheezes/rales/rhonchi. Gastrointestinal: Soft and non tender. No rebound. No guarding.  Genitourinary: Deferred Musculoskeletal: Normal range of motion in all extremities. Positive for 1+ lower extremity edema.  Neurologic:  Normal speech and language. No gross focal neurologic deficits are appreciated.  Skin:  Skin is warm, dry and intact. No rash noted. Psychiatric: Mood and affect are normal. Speech and behavior are normal. Patient exhibits appropriate insight and judgment.  ____________________________________________    LABS (pertinent positives/negatives)  BMP na 134, k 4.4, glu 148, bun 25, cr 1.86 CBC wbc 8.5, hgb 11.1, plt 247  ____________________________________________   EKG  I, Phineas Semen, attending physician, personally viewed and interpreted this EKG  EKG Time: 1526 Rate: 70 Rhythm: atrial ventricular paced dual rhythm Axis: rightword axis Intervals: qtc 618 QRS: wide ST changes: no st elevation Impression: abnormal ekg   ____________________________________________    RADIOLOGY  CXR Left greater than right pleural effusion. Atelectasis vs infiltrate.   ____________________________________________   PROCEDURES  Procedures  ____________________________________________   INITIAL IMPRESSION / ASSESSMENT AND PLAN / ED COURSE  Pertinent labs & imaging results that were available during my care of the patient were reviewed by me and considered in my medical decision making (see chart for details).   Patient presented to the emergency department  today because of concerns for continued weakness.  It does not appear that there are any acute issues with the patient.  Patient's blood work showed a slightly elevated creatinine although patient has had this elevation in the past.  Discussed this with the patient.  Did recommend he hold dose of his Lasix.  Recommended that he get this rechecked.  Patient's chest x-ray showed some pleural effusion but no edema.  Patient denies any increased shortness of breath.  Discussed with patient that given findings I think he  could be followed up as an outpatient.  Will give patient heart failure clinic information.  There was some concerns for possible pneumonia on the chest x-ray however given lack of fever, leukocytosis or worsening shortness of breath I doubt pneumonia.  Think atelectasis likely.  Will discharge patient to follow-up with primary care as well as heart failure clinic.  ____________________________________________   FINAL CLINICAL IMPRESSION(S) / ED DIAGNOSES  Final diagnoses:  Weakness  Pleural effusion     Note: This dictation was prepared with Dragon dictation. Any transcriptional errors that result from this process are unintentional     Phineas Semen, MD 09/17/18 1756

## 2018-09-17 NOTE — ED Triage Notes (Signed)
PT to ER via EMS from home with c/o generalized weakness since December that has worsened today.  Pt was able to ambulate after EMS assisted him to his feet.

## 2018-09-17 NOTE — Discharge Instructions (Signed)
As we discussed your kidney function was a little worse than normal. Please do not take your lasix for one day and get your creatinine rechecked. Please follow up with the heart failure clinic to help manage your heart failure. Please return for any fevers, worsening shortness of breath, productive cough, bloody cough or any other new or concerning symptoms.

## 2018-09-25 ENCOUNTER — Telehealth: Payer: Self-pay

## 2018-09-25 NOTE — Telephone Encounter (Signed)
Left message to contact clinic for appointment

## 2018-09-25 NOTE — Telephone Encounter (Signed)
-----   Message from Delma Freeze, [place] sent at 09/20/2018  8:11 AM EST ----- Regarding: ED referral / please call

## 2018-11-07 ENCOUNTER — Other Ambulatory Visit: Payer: Self-pay

## 2018-11-07 ENCOUNTER — Encounter: Payer: Self-pay | Admitting: Emergency Medicine

## 2018-11-07 ENCOUNTER — Inpatient Hospital Stay
Admission: EM | Admit: 2018-11-07 | Discharge: 2018-11-08 | DRG: 309 | Disposition: A | Discharge status: Hospice | Attending: Specialist | Admitting: Specialist

## 2018-11-07 ENCOUNTER — Emergency Department

## 2018-11-07 DIAGNOSIS — N179 Acute kidney failure, unspecified: Secondary | ICD-10-CM | POA: Diagnosis present

## 2018-11-07 DIAGNOSIS — R000 Tachycardia, unspecified: Secondary | ICD-10-CM

## 2018-11-07 DIAGNOSIS — Z7901 Long term (current) use of anticoagulants: Secondary | ICD-10-CM

## 2018-11-07 DIAGNOSIS — K219 Gastro-esophageal reflux disease without esophagitis: Secondary | ICD-10-CM | POA: Diagnosis present

## 2018-11-07 DIAGNOSIS — E1122 Type 2 diabetes mellitus with diabetic chronic kidney disease: Secondary | ICD-10-CM | POA: Diagnosis present

## 2018-11-07 DIAGNOSIS — Z95810 Presence of automatic (implantable) cardiac defibrillator: Secondary | ICD-10-CM

## 2018-11-07 DIAGNOSIS — I5022 Chronic systolic (congestive) heart failure: Secondary | ICD-10-CM | POA: Diagnosis present

## 2018-11-07 DIAGNOSIS — Z794 Long term (current) use of insulin: Secondary | ICD-10-CM

## 2018-11-07 DIAGNOSIS — M79602 Pain in left arm: Secondary | ICD-10-CM

## 2018-11-07 DIAGNOSIS — I472 Ventricular tachycardia, unspecified: Secondary | ICD-10-CM

## 2018-11-07 DIAGNOSIS — J449 Chronic obstructive pulmonary disease, unspecified: Secondary | ICD-10-CM | POA: Diagnosis present

## 2018-11-07 DIAGNOSIS — Z8249 Family history of ischemic heart disease and other diseases of the circulatory system: Secondary | ICD-10-CM | POA: Diagnosis not present

## 2018-11-07 DIAGNOSIS — I2510 Atherosclerotic heart disease of native coronary artery without angina pectoris: Secondary | ICD-10-CM | POA: Diagnosis present

## 2018-11-07 DIAGNOSIS — E785 Hyperlipidemia, unspecified: Secondary | ICD-10-CM | POA: Diagnosis present

## 2018-11-07 DIAGNOSIS — E871 Hypo-osmolality and hyponatremia: Secondary | ICD-10-CM | POA: Diagnosis present

## 2018-11-07 DIAGNOSIS — I252 Old myocardial infarction: Secondary | ICD-10-CM

## 2018-11-07 DIAGNOSIS — R32 Unspecified urinary incontinence: Secondary | ICD-10-CM | POA: Diagnosis present

## 2018-11-07 DIAGNOSIS — Z7982 Long term (current) use of aspirin: Secondary | ICD-10-CM

## 2018-11-07 DIAGNOSIS — E7800 Pure hypercholesterolemia, unspecified: Secondary | ICD-10-CM | POA: Diagnosis present

## 2018-11-07 DIAGNOSIS — Z888 Allergy status to other drugs, medicaments and biological substances status: Secondary | ICD-10-CM | POA: Diagnosis not present

## 2018-11-07 DIAGNOSIS — Z951 Presence of aortocoronary bypass graft: Secondary | ICD-10-CM | POA: Diagnosis not present

## 2018-11-07 DIAGNOSIS — I130 Hypertensive heart and chronic kidney disease with heart failure and stage 1 through stage 4 chronic kidney disease, or unspecified chronic kidney disease: Secondary | ICD-10-CM | POA: Diagnosis present

## 2018-11-07 DIAGNOSIS — I4891 Unspecified atrial fibrillation: Secondary | ICD-10-CM | POA: Diagnosis present

## 2018-11-07 DIAGNOSIS — Z66 Do not resuscitate: Secondary | ICD-10-CM | POA: Diagnosis present

## 2018-11-07 DIAGNOSIS — F17210 Nicotine dependence, cigarettes, uncomplicated: Secondary | ICD-10-CM | POA: Diagnosis present

## 2018-11-07 DIAGNOSIS — N189 Chronic kidney disease, unspecified: Secondary | ICD-10-CM | POA: Diagnosis present

## 2018-11-07 DIAGNOSIS — Z515 Encounter for palliative care: Secondary | ICD-10-CM | POA: Diagnosis not present

## 2018-11-07 DIAGNOSIS — Z91048 Other nonmedicinal substance allergy status: Secondary | ICD-10-CM

## 2018-11-07 DIAGNOSIS — I959 Hypotension, unspecified: Secondary | ICD-10-CM | POA: Diagnosis present

## 2018-11-07 DIAGNOSIS — Z809 Family history of malignant neoplasm, unspecified: Secondary | ICD-10-CM | POA: Diagnosis not present

## 2018-11-07 DIAGNOSIS — E861 Hypovolemia: Secondary | ICD-10-CM | POA: Diagnosis present

## 2018-11-07 LAB — BASIC METABOLIC PANEL
Anion gap: 8 (ref 5–15)
BUN: 41 mg/dL — ABNORMAL HIGH (ref 8–23)
CO2: 24 mmol/L (ref 22–32)
Calcium: 9.7 mg/dL (ref 8.9–10.3)
Chloride: 89 mmol/L — ABNORMAL LOW (ref 98–111)
Creatinine, Ser: 1.98 mg/dL — ABNORMAL HIGH (ref 0.61–1.24)
GFR calc Af Amer: 37 mL/min — ABNORMAL LOW (ref >60)
GFR calc non Af Amer: 32 mL/min — ABNORMAL LOW (ref >60)
Glucose, Bld: 397 mg/dL — ABNORMAL HIGH (ref 70–99)
Potassium: 4.6 mmol/L (ref 3.5–5.1)
Sodium: 121 mmol/L — ABNORMAL LOW (ref 135–145)

## 2018-11-07 LAB — CBC WITH DIFFERENTIAL/PLATELET
Abs Immature Granulocytes: 0.07 10*3/uL (ref 0.00–0.07)
Basophils Absolute: 0.1 10*3/uL (ref 0.0–0.1)
Basophils Relative: 1 %
EOS ABS: 0.1 10*3/uL (ref 0.0–0.5)
Eosinophils Relative: 1 %
HEMATOCRIT: 39.9 % (ref 39.0–52.0)
Hemoglobin: 12.4 g/dL — ABNORMAL LOW (ref 13.0–17.0)
Immature Granulocytes: 1 %
LYMPHS ABS: 2.7 10*3/uL (ref 0.7–4.0)
Lymphocytes Relative: 22 %
MCH: 24.2 pg — ABNORMAL LOW (ref 26.0–34.0)
MCHC: 31.1 g/dL (ref 30.0–36.0)
MCV: 77.8 fL — ABNORMAL LOW (ref 80.0–100.0)
Monocytes Absolute: 1 10*3/uL (ref 0.1–1.0)
Monocytes Relative: 8 %
Neutro Abs: 8.4 10*3/uL — ABNORMAL HIGH (ref 1.7–7.7)
Neutrophils Relative %: 67 %
Platelets: 251 10*3/uL (ref 150–400)
RBC: 5.13 MIL/uL (ref 4.22–5.81)
RDW: 18.5 % — AB (ref 11.5–15.5)
WBC: 12.3 10*3/uL — ABNORMAL HIGH (ref 4.0–10.5)
nRBC: 0 % (ref 0.0–0.2)

## 2018-11-07 LAB — GLUCOSE, CAPILLARY
Glucose-Capillary: 346 mg/dL — ABNORMAL HIGH (ref 70–99)
Glucose-Capillary: 306 mg/dL — ABNORMAL HIGH (ref 70–99)
Glucose-Capillary: 241 mg/dL — ABNORMAL HIGH (ref 70–99)
Glucose-Capillary: 121 mg/dL — ABNORMAL HIGH (ref 70–99)
Glucose-Capillary: 216 mg/dL — ABNORMAL HIGH (ref 70–99)

## 2018-11-07 LAB — TROPONIN I
Troponin I: 0.04 ng/mL (ref <0.03)
Troponin I: 0.05 ng/mL (ref <0.03)
Troponin I: 0.05 ng/mL (ref <0.03)
Troponin I: 0.05 ng/mL (ref <0.03)

## 2018-11-07 LAB — URINALYSIS, COMPLETE (UACMP) WITH MICROSCOPIC
Bilirubin Urine: NEGATIVE
Ketones, ur: NEGATIVE mg/dL
Nitrite: NEGATIVE
Protein, ur: 100 mg/dL — AB
RBC / HPF: >50 RBC/hpf — ABNORMAL HIGH (ref 0–5)
Specific Gravity, Urine: 1.013 (ref 1.005–1.030)
WBC, UA: >50 WBC/hpf — ABNORMAL HIGH (ref 0–5)
pH: 6 (ref 5.0–8.0)

## 2018-11-07 LAB — MRSA PCR SCREENING: MRSA by PCR: NEGATIVE

## 2018-11-07 LAB — SODIUM: Sodium: 127 mmol/L — ABNORMAL LOW (ref 135–145)

## 2018-11-07 LAB — TSH: TSH: 22.323 u[IU]/mL — ABNORMAL HIGH (ref 0.350–4.500)

## 2018-11-07 MED ORDER — LORAZEPAM 0.5 MG PO TABS: 0.5000 mg | ORAL_TABLET | ORAL | Status: DC | PRN

## 2018-11-07 MED ORDER — SODIUM CHLORIDE 0.9 % IV BOLUS
1000.0000 mL | Freq: Once | INTRAVENOUS | Status: AC
  Administered 2018-11-07: 22:00:00 1000 mL via INTRAVENOUS

## 2018-11-07 MED ORDER — NITROGLYCERIN 0.4 MG SL SUBL: 0.4000 mg | SUBLINGUAL_TABLET | SUBLINGUAL | Status: DC | PRN

## 2018-11-07 MED ORDER — ONDANSETRON HCL 4 MG/2ML IJ SOLN
4.0000 mg | Freq: Four times a day (QID) | INTRAMUSCULAR | Status: DC | PRN
  Administered 2018-11-07: 4 mg via INTRAVENOUS
  Filled 2018-11-07: qty 2

## 2018-11-07 MED ORDER — MORPHINE SULFATE (CONCENTRATE) 10 MG/0.5ML PO SOLN: 10.0000 mg | ORAL | Status: DC | PRN

## 2018-11-07 MED ORDER — ONDANSETRON HCL 4 MG PO TABS: 4.0000 mg | ORAL_TABLET | Freq: Four times a day (QID) | ORAL | Status: DC | PRN

## 2018-11-07 MED ORDER — PRAVASTATIN SODIUM 20 MG PO TABS
40.0000 mg | ORAL_TABLET | Freq: Every day | ORAL | Status: DC
  Administered 2018-11-07: 40 mg via ORAL
  Filled 2018-11-07: qty 2

## 2018-11-07 MED ORDER — APIXABAN 2.5 MG PO TABS
2.5000 mg | ORAL_TABLET | Freq: Two times a day (BID) | ORAL | Status: DC
  Administered 2018-11-07 – 2018-11-08 (×3): 2.5 mg via ORAL
  Filled 2018-11-07 (×3): qty 1

## 2018-11-07 MED ORDER — ALBUTEROL SULFATE (2.5 MG/3ML) 0.083% IN NEBU: 2.5000 mg | INHALATION_SOLUTION | RESPIRATORY_TRACT | Status: DC | PRN

## 2018-11-07 MED ORDER — METOPROLOL TARTRATE 50 MG PO TABS
50.0000 mg | ORAL_TABLET | Freq: Two times a day (BID) | ORAL | Status: DC
  Administered 2018-11-07: 50 mg via ORAL
  Filled 2018-11-07 (×2): qty 1

## 2018-11-07 MED ORDER — SODIUM CHLORIDE 0.9 % IV SOLN
Freq: Once | INTRAVENOUS | Status: AC
  Administered 2018-11-07: 150 mL/h via INTRAVENOUS

## 2018-11-07 MED ORDER — VITAMIN B-12 1000 MCG PO TABS
500.0000 ug | ORAL_TABLET | Freq: Every day | ORAL | Status: DC
  Administered 2018-11-07 – 2018-11-08 (×2): 500 ug via ORAL
  Filled 2018-11-07 (×2): qty 1

## 2018-11-07 MED ORDER — AMIODARONE IV BOLUS ONLY 150 MG/100ML
150.0000 mg | Freq: Once | INTRAVENOUS | Status: AC
  Administered 2018-11-07: 150 mg via INTRAVENOUS
  Filled 2018-11-07: qty 100

## 2018-11-07 MED ORDER — ACETAMINOPHEN 650 MG RE SUPP: 650.0000 mg | Freq: Four times a day (QID) | RECTAL | Status: DC | PRN

## 2018-11-07 MED ORDER — MEXILETINE HCL 150 MG PO CAPS
150.0000 mg | ORAL_CAPSULE | Freq: Two times a day (BID) | ORAL | Status: DC
  Administered 2018-11-07 – 2018-11-08 (×2): 150 mg via ORAL
  Filled 2018-11-07 (×4): qty 1

## 2018-11-07 MED ORDER — ACETAMINOPHEN 325 MG PO TABS: 650.0000 mg | ORAL_TABLET | Freq: Four times a day (QID) | ORAL | Status: DC | PRN

## 2018-11-07 MED ORDER — SODIUM CHLORIDE 0.9 % IV BOLUS
1000.0000 mL | Freq: Once | INTRAVENOUS | Status: AC
  Administered 2018-11-07: 1000 mL via INTRAVENOUS

## 2018-11-07 MED ORDER — APIXABAN 2.5 MG PO TABS: 2.5000 mg | ORAL_TABLET | Freq: Every day | ORAL | Status: DC

## 2018-11-07 MED ORDER — SPIRONOLACTONE 25 MG PO TABS
50.0000 mg | ORAL_TABLET | Freq: Every day | ORAL | Status: DC
  Administered 2018-11-07 – 2018-11-08 (×2): 50 mg via ORAL
  Filled 2018-11-07 (×2): qty 2

## 2018-11-07 MED ORDER — DOCUSATE SODIUM 100 MG PO CAPS
100.0000 mg | ORAL_CAPSULE | Freq: Two times a day (BID) | ORAL | Status: DC
  Administered 2018-11-07 – 2018-11-08 (×3): 100 mg via ORAL
  Filled 2018-11-07 (×3): qty 1

## 2018-11-07 MED ORDER — ADULT MULTIVITAMIN W/MINERALS CH
1.0000 | ORAL_TABLET | Freq: Every day | ORAL | Status: DC
  Administered 2018-11-07 – 2018-11-08 (×2): 1 via ORAL
  Filled 2018-11-07 (×2): qty 1

## 2018-11-07 MED ORDER — INSULIN ASPART 100 UNIT/ML ~~LOC~~ SOLN
0.0000 [IU] | Freq: Three times a day (TID) | SUBCUTANEOUS | Status: DC
  Administered 2018-11-07: 3 [IU] via SUBCUTANEOUS
  Administered 2018-11-07: 7 [IU] via SUBCUTANEOUS
  Administered 2018-11-08: 09:00:00 3 [IU] via SUBCUTANEOUS
  Filled 2018-11-07 (×3): qty 1

## 2018-11-07 MED ORDER — INSULIN ASPART 100 UNIT/ML ~~LOC~~ SOLN
0.0000 [IU] | Freq: Every day | SUBCUTANEOUS | Status: DC
  Administered 2018-11-07: 22:00:00 2 [IU] via SUBCUTANEOUS
  Filled 2018-11-07: qty 1

## 2018-11-07 MED ORDER — OXYBUTYNIN CHLORIDE 5 MG PO TABS
5.0000 mg | ORAL_TABLET | Freq: Two times a day (BID) | ORAL | Status: DC
  Administered 2018-11-07 – 2018-11-08 (×3): 5 mg via ORAL
  Filled 2018-11-07 (×4): qty 1

## 2018-11-07 MED ORDER — FUROSEMIDE 40 MG PO TABS
40.0000 mg | ORAL_TABLET | Freq: Every day | ORAL | Status: DC
  Administered 2018-11-07 – 2018-11-08 (×2): 40 mg via ORAL
  Filled 2018-11-07: qty 1
  Filled 2018-11-07: qty 2

## 2018-11-07 NOTE — Progress Notes (Signed)
Inpatient Diabetes Program Recommendations  AACE/ADA: New Consensus Statement on Inpatient Glycemic Control   Target Ranges:  Prepandial:   less than 140 mg/dL      Peak postprandial:   less than 180 mg/dL (1-2 hours)      Critically ill patients:  140 - 180 mg/dL   Results for [name], [name] (MRN [id number]) as of 11/07/2018 09:20  Ref. Range 11/07/2018 06:31 11/07/2018 07:57  Glucose-Capillary Latest Ref Range: 70 - 99 mg/dL 449 (H) 675 (H)   Review of Glycemic Control  Diabetes history: DM2 Outpatient Diabetes medications: Regular 18 units BID (has NPH 18 units BID on med list but notes that pt is not taking) Current orders for Inpatient glycemic control: Novolog 0-9 units TID with meals, Novolog 0-5 units QHS  Inpatient Diabetes Program Recommendations:   Insulin - Basal: Please consider ordering Lantus 5 units Q24H starting now (based on 49.4 kg x 0.1 units).  Thanks, Orlando Penner, RN, MSN, CDE Diabetes Coordinator Inpatient Diabetes Program [phone] (Team Pager from 8am to 5pm)

## 2018-11-07 NOTE — ED Notes (Signed)
Grn/Lav tubes sent to lab.

## 2018-11-07 NOTE — Progress Notes (Signed)
Visit made. 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 He is a DNR code with out of facility DNR in place in the home. He was sent to the [facility] Ed for evaluation of chest pain rapid heart rate unrelieved by oral morphine and nitro tabs. Patient seen sitting up in bed, alert and oriented, loose nonproductive cough noted. Of note patient does continue to smoke at home. He denied chest pain or any discomfort. Per staff RN Martie Lee and [facility] Charisse March patient may discharge home today. Signed DNR in place in shadow chart. Will continue to follow and update hospice team.  Dayna Barker BSN, RN, [facility] Liaison [facility] hospice [phone]

## 2018-11-07 NOTE — Progress Notes (Signed)
This nurse assuming care of pt.  Pt awake alert, appears in good spirits. Oriented to unit environment. Denies sob, chest pain or discomfort.  No acute distress noted, bed low, call light in reach.  Will cont to monitor. [id number]

## 2018-11-07 NOTE — ED Notes (Signed)
Sherilyn Cooter RN attempted to call report, report was not given. ICU will call back to get report.

## 2018-11-07 NOTE — TOC Initial Note (Signed)
Transition of Care [facility]) - Initial/Assessment Note    Patient Details  Name: [name] MRN: [id number] Date of Birth: 07-24-1946  Transition of Care [facility]) CM/SW Contact:    Allayne Butcher, RN Phone Number: 11/07/2018, 11:25 AM  Clinical Narrative:                 Patient is from home with hospice care.  [facility] Hospice liaison Dayna Barker notified of patient admission and has been up to speak with patient.  Patient will discharge back home with hospice once sodium level has been corrected.  Patient will need transport by EMS.   Expected Discharge Plan: Home w Hospice Care Barriers to Discharge: Continued Medical Work up   Patient Goals and CMS Choice Patient states their goals for this hospitalization and ongoing recovery are:: Wants to go home      Expected Discharge Plan and Services Expected Discharge Plan: Home w Hospice Care       Living arrangements for the past 2 months: Single Family Home                          Prior Living Arrangements/Services Living arrangements for the past 2 months: Single Family Home Lives with:: Spouse Patient language and need for interpreter reviewed:: No Do you feel safe going back to the place where you live?: Yes      Need for Family Participation in Patient Care: Yes (Comment)(Hospice patient) Care giver support system in place?: Yes (comment)(wife and hospice of [place]) Current home services: Hospice Criminal Activity/Legal Involvement Pertinent to Current Situation/Hospitalization: No - Comment as needed  Activities of Daily Living Home Assistive Devices/Equipment: CBG Meter, Grab bars around toilet, Wheelchair, Grab bars in shower ADL Screening (condition at time of admission) Patient's cognitive ability adequate to safely complete daily activities?: Yes Is the patient deaf or have difficulty hearing?: No Does the patient have difficulty seeing, even when wearing glasses/contacts?: No Does the patient have  difficulty concentrating, remembering, or making decisions?: No Patient able to express need for assistance with ADLs?: Yes Does the patient have difficulty dressing or bathing?: Yes Independently performs ADLs?: No Communication: Independent Dressing (OT): Independent Grooming: Independent Feeding: Independent Bathing: Needs assistance Is this a change from baseline?: Pre-admission baseline Toileting: Needs assistance Is this a change from baseline?: Pre-admission baseline In/Out Bed: Needs assistance Is this a change from baseline?: Pre-admission baseline Walks in Home: Independent with device (comment)(wheel chair) Does the patient have difficulty walking or climbing stairs?: Yes Weakness of Legs: Both Weakness of Arms/Hands: Both  Permission Sought/Granted                  Emotional Assessment Appearance:: Appears stated age Attitude/Demeanor/Rapport: Engaged Affect (typically observed): Accepting Orientation: : Oriented to Self, Oriented to Place, Oriented to  Time, Oriented to Situation Alcohol / Substance Use: Not Applicable Psych Involvement: No (comment)  Admission diagnosis:  Wide-complex tachycardia (HCC) [I47.2] Left arm pain [M79.602] Patient Active Problem List   Diagnosis Date Noted  . Coffee ground emesis 03/09/2018  . Shingles 03/09/2018  . UGIB (upper gastrointestinal bleed)   . PNA (pneumonia) 02/28/2018  . Ventricular tachycardia (HCC) 02/13/2018  . Protein-calorie malnutrition, severe 02/13/2018  . Hyperlipidemia 05/17/2017  . Hypertension 04/21/2017  . Diabetes mellitus without complication (HCC) 04/21/2017  . Tobacco use disorder 04/21/2017  . Atherosclerosis of native arteries of extremity with intermittent claudication (HCC) 04/21/2017   PCP:  Lauro Regulus, MD Pharmacy:  [facility] [address] ([address]), [place] - [address]) [place] [address] Phone: [phone] Fax:  [phone]     Social Determinants of Health (SDOH) Interventions    Readmission Risk Interventions No flowsheet data found.

## 2018-11-07 NOTE — ED Notes (Signed)
ED TO INPATIENT HANDOFF REPORT  ED Nurse Name and Phone #: Roberts Gaudy [phone]  S Name/Age/Gender Tia Alert 73 y.o. male Room/Bed: ED10A/ED10A  Code Status   Code Status: Prior  Home/SNF/Other Home Patient oriented to: self, place, time and situation Is this baseline? Yes   Triage Complete: Triage complete  Chief Complaint [facility] - Increased heart rate  Triage Note Pt arrived to the ED via EMS from home for complaints of palpitations and left arm pain. EMS was called per Hospice nurse which gave the Pt 20mg  of morphine, nitro x2, 0.5 of Ativan and an extra metropolol.Pt is a hospice/DNR Pt, AOx4 in no apparent distress upon arrival to the ED, Dr. Derrill Kay at bedside.    Allergies Allergies  Allergen Reactions  . Other     Intolerant to blood thinners - pt states that he has had "internal bleeding" while on blood thinners  . Captopril Other (See Comments)  . Adhesive [Tape] Other (See Comments)    "PULLS SKIN OFF" PER PT REPORT. OK TO USE PAPER TAPE.    Level of Care/Admitting Diagnosis ED Disposition    ED Disposition Condition Comment   Admit  Hospital Area: [facility] [[id number]]  Level of Care: Stepdown [14]  Diagnosis: Ventricular tachycardia [facility]) [[id number]]  Admitting Physician: Arnaldo Natal [[id number]]  Attending Physician: Arnaldo Natal [[id number]]  Estimated length of stay: past midnight tomorrow  Certification:: I certify this patient will need inpatient services for at least 2 midnights  PT Class (Do Not Modify): Inpatient [101]  PT Acc Code (Do Not Modify): Private [1]       B Medical/Surgery History Past Medical History:  Diagnosis Date  . AICD (automatic cardioverter/defibrillator) present 2010   battery changed out 2015  . Anginal pain (HCC)    infrequent pain. usually feels as pain in the arms  . Bilateral swelling of feet   . CHF (congestive heart failure) (HCC)   . Chronic cough   . Chronic kidney disease     renal insufficiency.started on spironalactone  . COPD (chronic obstructive pulmonary disease) (HCC)    does not use o2  . Coronary artery disease   . Diabetes mellitus without complication (HCC)   . Dysrhythmia   . GERD (gastroesophageal reflux disease)   . Hepatitis   . Hypercholesteremia   . Hypertension    history of,   . Hyponatremia   . Myocardial infarction (HCC) 1993   several. per patient, has had 10 heart attacks  . Pancreatic pseudocyst   . Pneumonia   . Presence of permanent cardiac pacemaker    and defibrilator   Past Surgical History:  Procedure Laterality Date  . CARDIAC CATHETERIZATION     with stents  . CARDIAC DEFIBRILLATOR PLACEMENT  2010  . CATARACT EXTRACTION W/PHACO Left 02/23/2016   Procedure: CATARACT EXTRACTION PHACO AND INTRAOCULAR LENS PLACEMENT (IOC);  Surgeon: Galen Manila, MD;  Location: [facility] ORS;  Service: Ophthalmology;  Laterality: Left;  [place] 1.05 AP% 22.9 CDE 15.02 Fluid pack lot # [id number]  . CHOLECYSTECTOMY  2014  . CORONARY ARTERY BYPASS GRAFT    . ERCP W/ SPHICTEROTOMY    . ESOPHAGOGASTRODUODENOSCOPY (EGD) WITH PROPOFOL N/A 04/24/2018   Procedure: ESOPHAGOGASTRODUODENOSCOPY (EGD) WITH PROPOFOL;  Surgeon: Toledo, Boykin Nearing, MD;  Location: [facility] ENDOSCOPY;  Service: Gastroenterology;  Laterality: N/A;  . feeding tube removal  2014  . GASTROSTOMY W/ FEEDING TUBE  2014   after galbladder surgery.  . LOWER EXTREMITY ANGIOGRAPHY Left  04/26/2017   Procedure: Lower Extremity Angiography;  Surgeon: Annice Needy, MD;  Location: [facility] INVASIVE CV LAB;  Service: Cardiovascular;  Laterality: Left;  . LOWER EXTREMITY ANGIOGRAPHY Right 05/25/2017   Procedure: Lower Extremity Angiography;  Surgeon: Annice Needy, MD;  Location: [facility] INVASIVE CV LAB;  Service: Cardiovascular;  Laterality: Right;  . PANCREATIC CYST EXCISION  2014   had hepatitis at this time  . STENT PLACEMENT NON-VASCULAR ([facility] HX)     cardiac stents x many     A IV  Location/Drains/Wounds Patient Lines/Drains/Airways Status   Active Line/Drains/Airways    Name:   Placement date:   Placement time:   Site:   Days:   Peripheral IV 11/07/18 Left Forearm   11/07/18    0254    Forearm   less than 1   Peripheral IV 11/07/18 Right Antecubital   11/07/18    0317    Antecubital   less than 1          Intake/Output Last 24 hours  Intake/Output Summary (Last 24 hours) at 11/07/2018 0546 Last data filed at 11/07/2018 0430 Gross per 24 hour  Intake 1100 ml  Output -  Net 1100 ml    Labs/Imaging Results for orders placed or performed during the hospital encounter of 11/07/18 (from the past 48 hour(s))  CBC with Differential     Status: Abnormal   Collection Time: 11/07/18  3:01 AM  Result Value Ref Range   WBC 12.3 (H) 4.0 - 10.5 K/uL   RBC 5.13 4.22 - 5.81 MIL/uL   Hemoglobin 12.4 (L) 13.0 - 17.0 g/dL   HCT 16.1 09.6 - 04.5 %   MCV 77.8 (L) 80.0 - 100.0 fL   MCH 24.2 (L) 26.0 - 34.0 pg   MCHC 31.1 30.0 - 36.0 g/dL   RDW 40.9 (H) 81.1 - 91.4 %   Platelets 251 150 - 400 K/uL   nRBC 0.0 0.0 - 0.2 %   Neutrophils Relative % 67 %   Neutro Abs 8.4 (H) 1.7 - 7.7 K/uL   Lymphocytes Relative 22 %   Lymphs Abs 2.7 0.7 - 4.0 K/uL   Monocytes Relative 8 %   Monocytes Absolute 1.0 0.1 - 1.0 K/uL   Eosinophils Relative 1 %   Eosinophils Absolute 0.1 0.0 - 0.5 K/uL   Basophils Relative 1 %   Basophils Absolute 0.1 0.0 - 0.1 K/uL   Immature Granulocytes 1 %   Abs Immature Granulocytes 0.07 0.00 - 0.07 K/uL    Comment: Performed at [facility], [address] [place], [place] [address]  Basic metabolic panel     Status: Abnormal   Collection Time: 11/07/18  3:01 AM  Result Value Ref Range   Sodium 121 (L) 135 - 145 mmol/L   Potassium 4.6 3.5 - 5.1 mmol/L   Chloride 89 (L) 98 - 111 mmol/L   CO2 24 22 - 32 mmol/L   Glucose, Bld 397 (H) 70 - 99 mg/dL   BUN 41 (H) 8 - 23 mg/dL   Creatinine, Ser 6.21 (H) 0.61 - 1.24 mg/dL   Calcium 9.7 8.9 -  30.8 mg/dL   GFR calc non Af Amer 32 (L) >60 mL/min   GFR calc Af Amer 37 (L) >60 mL/min   Anion gap 8 5 - 15    Comment: Performed at [facility], [address] [place], [place] [address]  Troponin I - ONCE - STAT     Status: Abnormal  Collection Time: 11/07/18  3:01 AM  Result Value Ref Range   Troponin I 0.04 (HH) <0.03 ng/mL    Comment: CRITICAL RESULT CALLED TO, READ BACK BY AND VERIFIED WITH [place] RIVERIA AT 0410 11/07/2018 SMA Performed at [facility], [address] [place], [place] [address]   Urinalysis, Complete w Microscopic     Status: Abnormal   Collection Time: 11/07/18  4:31 AM  Result Value Ref Range   Color, Urine YELLOW (A) YELLOW   APPearance CLOUDY (A) CLEAR   Specific Gravity, Urine 1.013 1.005 - 1.030   pH 6.0 5.0 - 8.0   Glucose, UA >=500 (A) NEGATIVE mg/dL   Hgb urine dipstick LARGE (A) NEGATIVE   Bilirubin Urine NEGATIVE NEGATIVE   Ketones, ur NEGATIVE NEGATIVE mg/dL   Protein, ur 244 (A) NEGATIVE mg/dL   Nitrite NEGATIVE NEGATIVE   Leukocytes,Ua LARGE (A) NEGATIVE   RBC / HPF >50 (H) 0 - 5 RBC/hpf   WBC, UA >50 (H) 0 - 5 WBC/hpf   Bacteria, UA RARE (A) NONE SEEN   Squamous Epithelial / LPF 0-5 0 - 5   WBC Clumps PRESENT    Non Squamous Epithelial PRESENT (A) NONE SEEN    Comment: Performed at [facility], [address], [place], [place] [address]   Dg Chest Portable 1 View  Result Date: 11/07/2018 CLINICAL DATA:  Palpitations and left arm pain EXAM: PORTABLE CHEST 1 VIEW COMPARISON:  Chest radiograph 09/17/2018 FINDINGS: Unchanged position of right chest wall AICD leads. Remote median sternotomy and CABG. Mild cardiomegaly. Lungs are clear. No pleural effusion or pneumothorax. IMPRESSION: No active disease. Electronically Signed   By: Deatra Robinson M.D.   On: 11/07/2018 04:29    Pending Labs Unresulted Labs (From admission, onward)    Start     Ordered   Signed and Held  TSH  Add-on,   R     Signed and Held           Vitals/Pain Today's Vitals   11/07/18 0400 11/07/18 0430 11/07/18 0500 11/07/18 0530  BP: (!) 84/71 (!) 80/70 (!) 83/70 (!) 80/68  Pulse: (!) 116 (!) 119 (!) 121 (!) 120  Resp: (!) 21 18 10 15   Temp:      TempSrc:      SpO2: 99% 98% 100% 94%  Weight:      Height:      PainSc:    0-No pain    Isolation Precautions No active isolations  Medications Medications  amiodarone (NEXTERONE) IV bolus only 150 mg/100 mL (0 mg Intravenous Stopped 11/07/18 0430)  sodium chloride 0.9 % bolus 1,000 mL (0 mLs Intravenous Stopped 11/07/18 0430)  0.9 %  sodium chloride infusion (150 mL/hr Intravenous New Bag/Given 11/07/18 0500)    Mobility non-ambulatory Low fall risk   Focused Assessments Cardiac Assessment Handoff:    Lab Results  Component Value Date   CKTOTAL 38 01/09/2013   TROPONINI 0.04 (HH) 11/07/2018   No results found for: DDIMER Does the Patient currently have chest pain? No     R Recommendations: See Admitting Provider Note  Report given to:   Additional Notes:

## 2018-11-07 NOTE — Consult Note (Addendum)
Name: [name] MRN: [id number] DOB: 1945/09/11     CONSULTATION DATE: 11/07/2018  REFERRING MD : Lindell Noe  CHIEF COMPLAINT: v tach  HISTORY OF PRESENT ILLNESS:  Pt arrived to the ED via EMS from home for complaints of palpitations and left arm pain.   EMS was called per Hospice nurse which gave the Pt 20mg  of morphine, nitro x2, 0.5 of Ativan and an extra metropolol.  Pt is a hospice/DNR   Pt, AOx4 in no apparent distress upon arrival to the ED   The patient indicates the pain is located in the forearm. States that the pain has been present for a little over 12 hours. Has been fairly constant. Apparently when nurse came to see patient today she did notice that he was in a fast heart rhythm. The patient was given an extra dose of his metoprolol as well as morphine and ativan, none of which helped change the heart rate. The patient does state that he can feel his heart beating fast. He denies any chest pain.   Patient given amiodarone bolus and admitted to ICU   PAST MEDICAL HISTORY :   has a past medical history of AICD (automatic cardioverter/defibrillator) present (2010), Anginal pain (HCC), Bilateral swelling of feet, CHF (congestive heart failure) (HCC), Chronic cough, Chronic kidney disease, COPD (chronic obstructive pulmonary disease) (HCC), Coronary artery disease, Diabetes mellitus without complication (HCC), Dysrhythmia, GERD (gastroesophageal reflux disease), Hepatitis, Hypercholesteremia, Hypertension, Hyponatremia, Myocardial infarction (HCC) (1993), Pancreatic pseudocyst, Pneumonia, and Presence of permanent cardiac pacemaker.  has a past surgical history that includes Cardiac catheterization; Coronary artery bypass graft; Pancreatic cyst excision (2014); Cataract extraction w/PHACO (Left, 02/23/2016); Gastrostomy w/ feeding tube (2014); feeding tube removal (2014); Lower Extremity Angiography (Left, 04/26/2017); Cholecystectomy (2014); STENT PLACEMENT NON-VASCULAR ([facility] HX);  Cardiac defibrillator placement (2010); Lower Extremity Angiography (Right, 05/25/2017); ERCP w/ sphicterotomy; and Esophagogastroduodenoscopy (egd) with propofol (N/A, 04/24/2018). Prior to Admission medications   Medication Sig Start Date End Date Taking? Authorizing Provider  acetaminophen (TYLENOL) 325 MG tablet Take 325-650 mg by mouth every 4 (four) hours as needed for mild pain or fever.   Yes [provider]  albuterol (PROVENTIL) (2.5 MG/3ML) 0.083% nebulizer solution Take 2.5 mg by nebulization as needed for wheezing.   Yes [provider]  apixaban (ELIQUIS) 2.5 MG TABS tablet Take 2.5 mg by mouth daily.   Yes [provider]  furosemide (LASIX) 40 MG tablet Take 40 mg by mouth daily.    Yes [provider]  insulin regular (NOVOLIN R,HUMULIN R) 100 units/mL injection Inject 18 Units into the skin 2 (two) times daily before a meal.   Yes [provider]  LORazepam (ATIVAN) 0.5 MG tablet Take 0.5 mg by mouth every 4 (four) hours as needed for anxiety.   Yes [provider]  metoprolol tartrate (LOPRESSOR) 50 MG tablet Take 50 mg by mouth 2 (two) times daily.   Yes [provider]  mexiletine (MEXITIL) 150 MG capsule Take 150 mg by mouth 2 (two) times daily.   Yes [provider]  morphine (ROXANOL) 20 MG/ML concentrated solution Take 10 mg by mouth every hour as needed for severe pain.   Yes [provider]  Multiple Vitamin (MULTIVITAMIN WITH MINERALS) TABS tablet Take 1 tablet by mouth daily.   Yes [provider]  nitroGLYCERIN (NITROSTAT) 0.4 MG SL tablet Place 0.4 mg under the tongue every 5 (five) minutes as needed for chest pain.   Yes [provider]  ondansetron (ZOFRAN) 8 MG tablet Take 8 mg by mouth every 8 (eight) hours as needed for nausea.   Yes [provider]  oxybutynin (DITROPAN) 5 MG tablet Take 5 mg by mouth 2 (two) times daily.   Yes [provider]   pravastatin (PRAVACHOL) 40 MG tablet Take 40 mg by mouth at bedtime.   Yes [provider]  spironolactone (ALDACTONE) 25 MG tablet Take 50 mg by mouth daily.  05/17/16  Yes [provider]  vitamin B-12 (CYANOCOBALAMIN) 500 MCG tablet Take 500 mcg by mouth daily.    Yes [provider]  amiodarone (PACERONE) 400 MG tablet Take 1 tablet (400 mg total) by mouth daily. Patient not taking: Reported on 11/07/2018 02/15/18   Enedina Finner, MD  guaiFENesin-dextromethorphan [facility] DM) 100-10 MG/5ML syrup Take 5 mLs by mouth every 4 (four) hours as needed for cough. Patient not taking: Reported on 11/07/2018 03/02/18   Shaune Pollack, MD  insulin NPH Human (HUMULIN N,NOVOLIN N) 100 UNIT/ML injection Inject 0.18 mLs (18 Units total) into the skin 2 (two) times daily before a meal. Patient not taking: Reported on 11/07/2018 02/14/18   Enedina Finner, MD  nitroGLYCERIN (NITRODUR - DOSED IN MG/24 HR) 0.1 mg/hr patch Place 1 patch (0.1 mg total) onto the skin daily as needed. Patient not taking: Reported on 11/07/2018 02/14/18   Enedina Finner, MD   Allergies  Allergen Reactions  . Other     Intolerant to blood thinners - pt states that he has had "internal bleeding" while on blood thinners  . Captopril Other (See Comments)  . Adhesive [Tape] Other (See Comments)    "PULLS SKIN OFF" PER PT REPORT. OK TO USE PAPER TAPE.    FAMILY HISTORY:  family history includes CAD in his brother; Cancer in his brother; Heart attack in his mother. SOCIAL HISTORY:  reports that he has been smoking. He has a 55.00 pack-year smoking history. He has never used smokeless tobacco. He reports that he does not drink alcohol or use drugs.  REVIEW OF SYSTEMS:   Constitutional: Negative for fever, chills, weight loss, malaise/fatigue and diaphoresis.  HENT: Negative for hearing loss, ear pain, nosebleeds, congestion, sore throat, neck pain, tinnitus and ear discharge.   Eyes: Negative for blurred vision, double  vision, photophobia, pain, discharge and redness.  Respiratory: Negative for cough, hemoptysis, sputum production, shortness of breath, wheezing and stridor.   Cardiovascular: + chest pain, +palpitations,  Gastrointestinal: Negative for heartburn, nausea, vomiting, abdominal pain, diarrhea, constipation, blood in stool and melena.  Genitourinary: Negative for dysuria, urgency, frequency, hematuria and flank pain.  Musculoskeletal: Negative for myalgias, back pain, joint pain and falls.  Skin: Negative for itching and rash.  Neurological: Negative for dizziness, tingling, tremors, sensory change, speech change, focal weakness, seizures, loss of consciousness, weakness and headaches.  Endo/Heme/Allergies: Negative for environmental allergies and polydipsia. Does not bruise/bleed easily.  ALL OTHER ROS ARE NEGATIVE    VITAL SIGNS: Temp:  [97.5 F (36.4 C)] 97.5 F (36.4 C) (03/25 0258) Pulse Rate:  [116-130] 120 (03/25 0530) Resp:  [10-21] 15 (03/25 0530) BP: (80-89)/(68-76) 80/68 (03/25 0530) SpO2:  [94 %-100 %] 94 % (03/25 0530) Weight:  [49.4 kg] 49.4 kg (03/25 0305)  Physical Examination:   GENERAL:NAD, no fevers, chills, no weakness no fatigue HEAD: Normocephalic, atraumatic.  EYES: Pupils equal, round, reactive to light. Extraocular muscles intact. No scleral icterus.  MOUTH: Moist mucosal membrane.   EAR, NOSE, THROAT: Clear without exudates. No external lesions.  NECK: Supple. No thyromegaly. No nodules. No JVD.  PULMONARY:CTA B/L no wheezes, no crackles, no rhonchi CARDIOVASCULAR: S1 and S2. Regular rate and rhythm. No murmurs, rubs, or gallops. No edema.  GASTROINTESTINAL: Soft, nontender, nondistended. No masses. Positive bowel sounds.  MUSCULOSKELETAL: No swelling, clubbing, or edema. Range of motion full in all extremities.  NEUROLOGIC: Cranial nerves II through XII are intact. No gross focal neurological deficits.  SKIN: No ulceration, lesions, rashes, or cyanosis.  Skin warm and dry. Turgor intact.  PSYCHIATRIC: Mood, affect within normal limits. The patient is awake, alert and oriented x 3. Insight, judgment intact.        IMAGING    Dg Chest Portable 1 View  Result Date: 11/07/2018 CLINICAL DATA:  Palpitations and left arm pain EXAM: PORTABLE CHEST 1 VIEW COMPARISON:  Chest radiograph 09/17/2018 FINDINGS: Unchanged position of right chest wall AICD leads. Remote median sternotomy and CABG. Mild cardiomegaly. Lungs are clear. No pleural effusion or pneumothorax. IMPRESSION: No active disease. Electronically Signed   By: Deatra Robinson M.D.   On: 11/07/2018 04:29   CBC    Component Value Date/Time   WBC 12.3 (H) 11/07/2018 0301   RBC 5.13 11/07/2018 0301   HGB 12.4 (L) 11/07/2018 0301   HGB 11.6 (L) 03/25/2014 1557   HCT 39.9 11/07/2018 0301   HCT 35.3 (L) 03/25/2014 1557   PLT 251 11/07/2018 0301   PLT 141 (L) 03/25/2014 1557   MCV 77.8 (L) 11/07/2018 0301   MCV 90 03/25/2014 1557   MCH 24.2 (L) 11/07/2018 0301   MCHC 31.1 11/07/2018 0301   RDW 18.5 (H) 11/07/2018 0301   RDW 16.4 (H) 03/25/2014 1557   LYMPHSABS 2.7 11/07/2018 0301   MONOABS 1.0 11/07/2018 0301   EOSABS 0.1 11/07/2018 0301   BASOSABS 0.1 11/07/2018 0301   BASOSABS 3 01/13/2013 0549   BMP Latest Ref Rng & Units 11/07/2018 09/17/2018 07/05/2018  Glucose 70 - 99 mg/dL 502(D) 741(O) 878(M)  BUN 8 - 23 mg/dL 76(H) 20(N) 15  Creatinine 0.61 - 1.24 mg/dL 4.70(J) 6.28(Z) 6.62(H)  Sodium 135 - 145 mmol/L 121(L) 134(L) 137  Potassium 3.5 - 5.1 mmol/L 4.6 4.4 3.7  Chloride 98 - 111 mmol/L 89(L) 103 106  CO2 22 - 32 mmol/L 24 22 23   Calcium 8.9 - 10.3 mg/dL 9.7 9.0 4.7(M)       ASSESSMENT AND PLAN SYNOPSIS  CARDIAC FAILURE-acute V tach Chronic systolic heart failure Follow up cardiology recs Amiodarone infusion Check enzymes SD monitoring  ACUTE KIDNEY INJURY/Renal Failure -follow chem 7 -follow UO -continue Foley Catheter-assess need  ELECTROLYTES -follow  labs as needed -replace as needed -pharmacy consultation and following    DVT/GI PRX ordered TRANSFUSIONS AS NEEDED MONITOR FSBS ASSESS the need for LABS as needed  ENDO - ICU hypoglycemic\Hyperglycemia protocol -check FSBS per protocol     Overall prognosis is poor Patient on hospice DNR/DNI    Janett Kamath Santiago Glad, M.D.  [facility]  Medical Director [facility] Medical Director [facility] Cardio-Pulmonary Department

## 2018-11-07 NOTE — Progress Notes (Addendum)
eLink Physician-Brief Progress Note Patient Name: [name] DOB: 26-Dec-1945 MRN: [id number]   Date of Service  11/07/2018  HPI/Events of Note  Patient admitted secondary to wide complex tachycardia and left arm pain. Tachycardia was responsive to Amiodarone.  eICU Interventions  New patient evaluation completed, cycle cardiac enzymes        Jakeem Grape U Leonce Bale 11/07/2018, 6:02 AM

## 2018-11-07 NOTE — ED Provider Notes (Signed)
[facility] Emergency Department Provider Note   ____________________________________________   I have reviewed the triage vital signs and the nursing notes.   HISTORY  Chief Complaint Arm Pain and Palpitations   History limited by: Poor historian   HPI [name] is a 73 y.o. male who presents to the emergency department today via EMS with main complaint of left arm pain. The patient indicates the pain is located in the forearm. States that the pain has been present for a little over 12 hours. Has been fairly constant. Apparently when nurse came to see patient today she did notice that he was in a fast heart rhythm. The patient was given an extra dose of his metoprolol as well as morphine and ativan, none of which helped change the heart rate. The patient does state that he can feel his heart beating fast. He denies any chest pain.    Records reviewed. Per medical record review patient has a history of AICD, CHF, COPD. He has been electrically cardioverted in the emergency department on at least two occasions for wide complex tachycardias.   Past Medical History:  Diagnosis Date  . AICD (automatic cardioverter/defibrillator) present 2010   battery changed out 2015  . Anginal pain (HCC)    infrequent pain. usually feels as pain in the arms  . Bilateral swelling of feet   . CHF (congestive heart failure) (HCC)   . Chronic cough   . Chronic kidney disease    renal insufficiency.started on spironalactone  . COPD (chronic obstructive pulmonary disease) (HCC)    does not use o2  . Coronary artery disease   . Diabetes mellitus without complication (HCC)   . Dysrhythmia   . GERD (gastroesophageal reflux disease)   . Hepatitis   . Hypercholesteremia   . Hypertension    history of,   . Hyponatremia   . Myocardial infarction (HCC) 1993   several. per patient, has had 10 heart attacks  . Pancreatic pseudocyst   . Pneumonia   . Presence of permanent  cardiac pacemaker    and defibrilator    Patient Active Problem List   Diagnosis Date Noted  . Coffee ground emesis 03/09/2018  . Shingles 03/09/2018  . UGIB (upper gastrointestinal bleed)   . PNA (pneumonia) 02/28/2018  . Ventricular tachycardia (HCC) 02/13/2018  . Protein-calorie malnutrition, severe 02/13/2018  . Hyperlipidemia 05/17/2017  . Hypertension 04/21/2017  . Diabetes mellitus without complication (HCC) 04/21/2017  . Tobacco use disorder 04/21/2017  . Atherosclerosis of native arteries of extremity with intermittent claudication (HCC) 04/21/2017    Past Surgical History:  Procedure Laterality Date  . CARDIAC CATHETERIZATION     with stents  . CARDIAC DEFIBRILLATOR PLACEMENT  2010  . CATARACT EXTRACTION W/PHACO Left 02/23/2016   Procedure: CATARACT EXTRACTION PHACO AND INTRAOCULAR LENS PLACEMENT (IOC);  Surgeon: Galen Manila, MD;  Location: [facility] ORS;  Service: Ophthalmology;  Laterality: Left;  [place] 1.05 AP% 22.9 CDE 15.02 Fluid pack lot # [id number]  . CHOLECYSTECTOMY  2014  . CORONARY ARTERY BYPASS GRAFT    . ERCP W/ SPHICTEROTOMY    . ESOPHAGOGASTRODUODENOSCOPY (EGD) WITH PROPOFOL N/A 04/24/2018   Procedure: ESOPHAGOGASTRODUODENOSCOPY (EGD) WITH PROPOFOL;  Surgeon: Toledo, Boykin Nearing, MD;  Location: [facility] ENDOSCOPY;  Service: Gastroenterology;  Laterality: N/A;  . feeding tube removal  2014  . GASTROSTOMY W/ FEEDING TUBE  2014   after galbladder surgery.  . LOWER EXTREMITY ANGIOGRAPHY Left 04/26/2017   Procedure: Lower Extremity Angiography;  Surgeon: Festus Barren  S, MD;  Location: [facility] INVASIVE CV LAB;  Service: Cardiovascular;  Laterality: Left;  . LOWER EXTREMITY ANGIOGRAPHY Right 05/25/2017   Procedure: Lower Extremity Angiography;  Surgeon: Annice Needy, MD;  Location: [facility] INVASIVE CV LAB;  Service: Cardiovascular;  Laterality: Right;  . PANCREATIC CYST EXCISION  2014   had hepatitis at this time  . STENT PLACEMENT NON-VASCULAR ([facility] HX)     cardiac stents x  many    Prior to Admission medications   Medication Sig Start Date End Date Taking? Authorizing Provider  amiodarone (PACERONE) 400 MG tablet Take 1 tablet (400 mg total) by mouth daily. 02/15/18   Enedina Finner, MD  aspirin EC 81 MG tablet Take 81 mg by mouth daily.    [provider]  digoxin (LANOXIN) 0.125 MG tablet Take 0.125 mg by mouth daily.    [provider]  furosemide (LASIX) 20 MG tablet Take 20 mg by mouth daily as needed for fluid.     [provider]  guaiFENesin-dextromethorphan (ROBITUSSIN DM) 100-10 MG/5ML syrup Take 5 mLs by mouth every 4 (four) hours as needed for cough. 03/02/18   Shaune Pollack, MD  insulin NPH Human (HUMULIN N,NOVOLIN N) 100 UNIT/ML injection Inject 0.18 mLs (18 Units total) into the skin 2 (two) times daily before a meal. Patient taking differently: Inject 15 Units into the skin 2 (two) times daily before a meal.  02/14/18   Enedina Finner, MD  Melatonin 5 MG TABS Take 10 mg by mouth at bedtime.     [provider]  metoprolol succinate (TOPROL-XL) 50 MG 24 hr tablet Take 50 mg by mouth 2 (two) times daily.    [provider]  Multiple Vitamin (MULTIVITAMIN) capsule Take 1 capsule by mouth daily.    [provider]  nitroGLYCERIN (NITRODUR - DOSED IN MG/24 HR) 0.1 mg/hr patch Place 1 patch (0.1 mg total) onto the skin daily as needed. Patient taking differently: Place 0.1 mg onto the skin daily as needed (chest pain).  02/14/18   Enedina Finner, MD  nitroGLYCERIN (NITROSTAT) 0.4 MG SL tablet Place 0.4 mg under the tongue every 5 (five) minutes as needed for chest pain.    [provider]  omeprazole (PRILOSEC) 40 MG capsule Take 40 mg by mouth daily.    [provider]  ondansetron [facility]) 4 MG/5ML solution Take by mouth every 8 (eight) hours as needed for nausea or vomiting.    [provider]  pantoprazole (PROTONIX) 40 MG tablet Take 1 tablet (40 mg total) by mouth daily. 03/11/18 04/10/18   Ihor Austin, MD  pravastatin (PRAVACHOL) 40 MG tablet Take 40 mg by mouth at bedtime.    [provider]  spironolactone (ALDACTONE) 25 MG tablet Take 25 mg by mouth every morning before breakfast. 05/17/16   [provider]  triamcinolone cream (KENALOG) 0.5 % Apply 1 application topically 2 (two) times daily. 01/17/18   [provider]  vitamin B-12 (CYANOCOBALAMIN) 1000 MCG tablet Take 1,000 mcg by mouth 2 (two) times daily.    [provider]    Allergies Other; Captopril; and Adhesive [tape]  Family History  Problem Relation Age of Onset  . Heart attack Mother   . Cancer Brother   . CAD Brother     Social History Social History   Tobacco Use  . Smoking status: Current Every Day Smoker    Packs/day: 1.00    Years: 55.00    Pack years: 55.00  . Smokeless tobacco:  Never Used  Substance Use Topics  . Alcohol use: No    Comment: rare  . Drug use: No    Review of Systems Constitutional: No fever/chills Eyes: No visual changes. ENT: No sore throat. Cardiovascular: Positive for palpitations.  Respiratory: Denies shortness of breath. Gastrointestinal: No abdominal pain.  No nausea, no vomiting.  No diarrhea.   Genitourinary: Negative for dysuria. Musculoskeletal: Positive for left arm pain.  Skin: Negative for rash. Neurological: Negative for headaches, focal weakness or numbness.  ____________________________________________   PHYSICAL EXAM:  VITAL SIGNS: ED Triage Vitals [11/07/18 0258]  Enc Vitals Group     BP      Pulse Rate (!) 130     Resp 14     Temp (!) 97.5 F (36.4 C)     Temp Source Oral     SpO2 96 %   Constitutional: Alert and oriented.  Eyes: Conjunctivae are normal.  ENT      Head: Normocephalic and atraumatic.      Nose: No congestion/rhinnorhea.      Mouth/Throat: Mucous membranes are moist.      Neck: No stridor. Hematological/Lymphatic/Immunilogical: No cervical lymphadenopathy. Cardiovascular:  Tachycardic, regular rhythm.  No murmurs, rubs, or gallops.  Respiratory: Normal respiratory effort without tachypnea nor retractions. Breath sounds are clear and equal bilaterally. No wheezes/rales/rhonchi. Gastrointestinal: Soft and non tender. No rebound. No guarding.  Genitourinary: Deferred Musculoskeletal: Normal range of motion in all extremities. No lower extremity edema. Neurologic:  Normal speech and language. No gross focal neurologic deficits are appreciated.  Skin:  Skin is warm, dry and intact. No rash noted. Psychiatric: Mood and affect are normal. Speech and behavior are normal. Patient exhibits appropriate insight and judgment.  ____________________________________________    LABS (pertinent positives/negatives)  Trop 0.04 BMp na 121, cl 89, glu 397, cr 1.98 CBC wbc 12.3, hgb 12.4, plt 251  ____________________________________________   EKG  I, Phineas Semen, attending physician, personally viewed and interpreted this EKG  EKG Time: 0254 Rate: 129 Rhythm: wide complex tachycardia Axis: left axis deviation Intervals: qtc 585 QRS: wide ST changes: no st elevation Impression: abnormal ekg  I, Phineas Semen, attending physician, personally viewed and interpreted this EKG  EKG Time: 0333 Rate: 114 Rhythm: ventricular paced rhythm Axis: rightward axis deviation Intervals: qtc 649 QRS: wide ST changes: no st elevation Impression: abnormal ekg   ____________________________________________    RADIOLOGY  CXR No acute abnormality ____________________________________________   PROCEDURES  Procedures  CRITICAL CARE Performed by: Phineas Semen   Total critical care time: 40 minutes  Critical care time was exclusive of separately billable procedures and treating other patients.  Critical care was necessary to treat or prevent imminent or life-threatening deterioration.  Critical care was time spent personally by me on the following  activities: development of treatment plan with patient and/or surrogate as well as nursing, discussions with consultants, evaluation of patient's response to treatment, examination of patient, obtaining history from patient or surrogate, ordering and performing treatments and interventions, ordering and review of laboratory studies, ordering and review of radiographic studies, pulse oximetry and re-evaluation of patient's condition.  ____________________________________________   INITIAL IMPRESSION / ASSESSMENT AND PLAN / ED COURSE  Pertinent labs & imaging results that were available during my care of the patient were reviewed by me and considered in my medical decision making (see chart for details).   Patient presented to the emergency department today because of concern for palpitations and some left arm pain. On initial exam the patient  was found to be in a wide complex tachycardia. Per chart review the patient had required electrical cardioversion a couple of times in the past. Patient was awake alert. He was given amiodarone and shortly thereafter his heart rate slowed down and started to be paced again. The patient's blood work does show hyperglycemia and hyponatremia even after correction. Do have some concern for dehydration. Would also consider infection however no obvious source. Discussed findings with patient. Will plan on admission.  ____________________________________________   FINAL CLINICAL IMPRESSION(S) / ED DIAGNOSES  Final diagnoses:  Left arm pain  Wide-complex tachycardia (HCC)     Note: This dictation was prepared with Dragon dictation. Any transcriptional errors that result from this process are unintentional     Phineas Semen, MD 11/07/18 [phone]

## 2018-11-07 NOTE — Consult Note (Addendum)
[name] is a 73 y.o. male  [id number]  Primary Cardiologist: Dr. Adrian Blackwater  Reason for Consultation: Kirby Funk  HPI: 73yo male with past medical history as below presented, code status DNR with home hospice presented to ED via EMS with reports of palpitations and left arm pain unrelieved with morphine. He was found to be in ventricular tachycardia and give amiodarone bolus. Troponin mildly elevated 0.05. He is now resting comfortably with external pacing.   Review of Systems: Resting comfortably, denies chest pain or any discomfort.    Past Medical History:  Diagnosis Date  . AICD (automatic cardioverter/defibrillator) present 2010   battery changed out 2015  . Anginal pain (HCC)    infrequent pain. usually feels as pain in the arms  . Bilateral swelling of feet   . CHF (congestive heart failure) (HCC)   . Chronic cough   . Chronic kidney disease    renal insufficiency.started on spironalactone  . COPD (chronic obstructive pulmonary disease) (HCC)    does not use o2  . Coronary artery disease   . Diabetes mellitus without complication (HCC)   . Dysrhythmia   . GERD (gastroesophageal reflux disease)   . Hepatitis   . Hypercholesteremia   . Hypertension    history of,   . Hyponatremia   . Myocardial infarction (HCC) 1993   several. per patient, has had 10 heart attacks  . Pancreatic pseudocyst   . Pneumonia   . Presence of permanent cardiac pacemaker    and defibrilator    Medications Prior to Admission  Medication Sig Dispense Refill  . acetaminophen (TYLENOL) 325 MG tablet Take 325-650 mg by mouth every 4 (four) hours as needed for mild pain or fever.    Marland Kitchen albuterol (PROVENTIL) (2.5 MG/3ML) 0.083% nebulizer solution Take 2.5 mg by nebulization as needed for wheezing.    Marland Kitchen apixaban (ELIQUIS) 2.5 MG TABS tablet Take 2.5 mg by mouth daily.    . furosemide (LASIX) 40 MG tablet Take 40 mg by mouth daily.     . insulin regular (NOVOLIN R,HUMULIN R) 100 units/mL  injection Inject 18 Units into the skin 2 (two) times daily before a meal.    . LORazepam (ATIVAN) 0.5 MG tablet Take 0.5 mg by mouth every 4 (four) hours as needed for anxiety.    . metoprolol tartrate (LOPRESSOR) 50 MG tablet Take 50 mg by mouth 2 (two) times daily.    Marland Kitchen mexiletine (MEXITIL) 150 MG capsule Take 150 mg by mouth 2 (two) times daily.    Marland Kitchen morphine (ROXANOL) 20 MG/ML concentrated solution Take 10 mg by mouth every hour as needed for severe pain.    . Multiple Vitamin (MULTIVITAMIN WITH MINERALS) TABS tablet Take 1 tablet by mouth daily.    . nitroGLYCERIN (NITROSTAT) 0.4 MG SL tablet Place 0.4 mg under the tongue every 5 (five) minutes as needed for chest pain.    Marland Kitchen ondansetron (ZOFRAN) 8 MG tablet Take 8 mg by mouth every 8 (eight) hours as needed for nausea.    Marland Kitchen oxybutynin (DITROPAN) 5 MG tablet Take 5 mg by mouth 2 (two) times daily.    . pravastatin (PRAVACHOL) 40 MG tablet Take 40 mg by mouth at bedtime.    Marland Kitchen spironolactone (ALDACTONE) 25 MG tablet Take 50 mg by mouth daily.     . vitamin B-12 (CYANOCOBALAMIN) 500 MCG tablet Take 500 mcg by mouth daily.     Marland Kitchen amiodarone (PACERONE) 400 MG tablet Take 1 tablet (400 mg total)  by mouth daily. (Patient not taking: Reported on 11/07/2018) 20 tablet 0  . guaiFENesin-dextromethorphan (ROBITUSSIN DM) 100-10 MG/5ML syrup Take 5 mLs by mouth every 4 (four) hours as needed for cough. (Patient not taking: Reported on 11/07/2018) 118 mL 0  . insulin NPH Human (HUMULIN N,NOVOLIN N) 100 UNIT/ML injection Inject 0.18 mLs (18 Units total) into the skin 2 (two) times daily before a meal. (Patient not taking: Reported on 11/07/2018) 10 mL 11  . nitroGLYCERIN (NITRODUR - DOSED IN MG/24 HR) 0.1 mg/hr patch Place 1 patch (0.1 mg total) onto the skin daily as needed. (Patient not taking: Reported on 11/07/2018) 30 patch 1     . apixaban  2.5 mg Oral BID  . docusate sodium  100 mg Oral BID  . furosemide  40 mg Oral Daily  . insulin aspart  0-5 Units  Subcutaneous QHS  . insulin aspart  0-9 Units Subcutaneous TID WC  . metoprolol tartrate  50 mg Oral BID  . mexiletine  150 mg Oral BID  . multivitamin with minerals  1 tablet Oral Daily  . oxybutynin  5 mg Oral BID  . pravastatin  40 mg Oral QHS  . spironolactone  50 mg Oral Daily  . vitamin B-12  500 mcg Oral Daily    Infusions:   Allergies  Allergen Reactions  . Other     Intolerant to blood thinners - pt states that he has had "internal bleeding" while on blood thinners  . Captopril Other (See Comments)  . Adhesive [Tape] Other (See Comments)    "PULLS SKIN OFF" PER PT REPORT. OK TO USE PAPER TAPE.    Social History   Socioeconomic History  . Marital status: Married    Spouse name: Not on file  . Number of children: Not on file  . Years of education: Not on file  . Highest education level: Not on file  Occupational History  . Not on file  Social Needs  . Financial resource strain: Not on file  . Food insecurity:    Worry: Not on file    Inability: Not on file  . Transportation needs:    Medical: Not on file    Non-medical: Not on file  Tobacco Use  . Smoking status: Current Every Day Smoker    Packs/day: 1.00    Years: 55.00    Pack years: 55.00  . Smokeless tobacco: Never Used  Substance and Sexual Activity  . Alcohol use: No    Comment: rare  . Drug use: No  . Sexual activity: Never  Lifestyle  . Physical activity:    Days per week: Not on file    Minutes per session: Not on file  . Stress: Not on file  Relationships  . Social connections:    Talks on phone: Not on file    Gets together: Not on file    Attends religious service: Not on file    Active member of club or organization: Not on file    Attends meetings of clubs or organizations: Not on file    Relationship status: Not on file  . Intimate partner violence:    Fear of current or ex partner: Not on file    Emotionally abused: Not on file    Physically abused: Not on file    Forced  sexual activity: Not on file  Other Topics Concern  . Not on file  Social History Narrative  . Not on file    Family History  Problem Relation Age of Onset  . Heart attack Mother   . Cancer Brother   . CAD Brother     PHYSICAL EXAM: Vitals:   11/07/18 0500 11/07/18 0530  BP: (!) 83/70 (!) 80/68  Pulse: (!) 121 (!) 120  Resp: 10 15  Temp:    SpO2: 100% 94%     Intake/Output Summary (Last 24 hours) at 11/07/2018 0944 Last data filed at 11/07/2018 0430 Gross per 24 hour  Intake 1100 ml  Output -  Net 1100 ml    General: Pale weak, frail HEENT:  Normocephalic and atramatic Neck:  No JVD.  Lungs: Clear bilaterally to auscultation and percussion. Heart: HRRR . Normal S1 and S2 without gallops or murmurs.  Abdomen: Bowel sounds are positive, abdomen soft and non-tender  Msk: frail Extremities: No clubbing, cyanosis or edema.   Neuro: lethargic  ECG: Sinus or ectopic atrial tachycardia 129bpm Ventricular premature complex Prolonged PR interval LAE, consider biatrial enlargement Nonspecific IVCD with LAD LVH with secondary repolarization abnormality  Results for orders placed or performed during the hospital encounter of 11/07/18 (from the past 24 hour(s))  CBC with Differential     Status: Abnormal   Collection Time: 11/07/18  3:01 AM  Result Value Ref Range   WBC 12.3 (H) 4.0 - 10.5 K/uL   RBC 5.13 4.22 - 5.81 MIL/uL   Hemoglobin 12.4 (L) 13.0 - 17.0 g/dL   HCT 16.1 09.6 - 04.5 %   MCV 77.8 (L) 80.0 - 100.0 fL   MCH 24.2 (L) 26.0 - 34.0 pg   MCHC 31.1 30.0 - 36.0 g/dL   RDW 40.9 (H) 81.1 - 91.4 %   Platelets 251 150 - 400 K/uL   nRBC 0.0 0.0 - 0.2 %   Neutrophils Relative % 67 %   Neutro Abs 8.4 (H) 1.7 - 7.7 K/uL   Lymphocytes Relative 22 %   Lymphs Abs 2.7 0.7 - 4.0 K/uL   Monocytes Relative 8 %   Monocytes Absolute 1.0 0.1 - 1.0 K/uL   Eosinophils Relative 1 %   Eosinophils Absolute 0.1 0.0 - 0.5 K/uL   Basophils Relative 1 %   Basophils Absolute 0.1  0.0 - 0.1 K/uL   Immature Granulocytes 1 %   Abs Immature Granulocytes 0.07 0.00 - 0.07 K/uL  Basic metabolic panel     Status: Abnormal   Collection Time: 11/07/18  3:01 AM  Result Value Ref Range   Sodium 121 (L) 135 - 145 mmol/L   Potassium 4.6 3.5 - 5.1 mmol/L   Chloride 89 (L) 98 - 111 mmol/L   CO2 24 22 - 32 mmol/L   Glucose, Bld 397 (H) 70 - 99 mg/dL   BUN 41 (H) 8 - 23 mg/dL   Creatinine, Ser 7.82 (H) 0.61 - 1.24 mg/dL   Calcium 9.7 8.9 - 95.6 mg/dL   GFR calc non Af Amer 32 (L) >60 mL/min   GFR calc Af Amer 37 (L) >60 mL/min   Anion gap 8 5 - 15  Troponin I - ONCE - STAT     Status: Abnormal   Collection Time: 11/07/18  3:01 AM  Result Value Ref Range   Troponin I 0.04 (HH) <0.03 ng/mL  TSH     Status: Abnormal   Collection Time: 11/07/18  3:01 AM  Result Value Ref Range   TSH 22.323 (H) 0.350 - 4.500 uIU/mL  Urinalysis, Complete w Microscopic     Status: Abnormal   Collection Time: 11/07/18  4:31  AM  Result Value Ref Range   Color, Urine YELLOW (A) YELLOW   APPearance CLOUDY (A) CLEAR   Specific Gravity, Urine 1.013 1.005 - 1.030   pH 6.0 5.0 - 8.0   Glucose, UA >=500 (A) NEGATIVE mg/dL   Hgb urine dipstick LARGE (A) NEGATIVE   Bilirubin Urine NEGATIVE NEGATIVE   Ketones, ur NEGATIVE NEGATIVE mg/dL   Protein, ur 737 (A) NEGATIVE mg/dL   Nitrite NEGATIVE NEGATIVE   Leukocytes,Ua LARGE (A) NEGATIVE   RBC / HPF >50 (H) 0 - 5 RBC/hpf   WBC, UA >50 (H) 0 - 5 WBC/hpf   Bacteria, UA RARE (A) NONE SEEN   Squamous Epithelial / LPF 0-5 0 - 5   WBC Clumps PRESENT    Non Squamous Epithelial PRESENT (A) NONE SEEN  Glucose, capillary     Status: Abnormal   Collection Time: 11/07/18  6:31 AM  Result Value Ref Range   Glucose-Capillary 346 (H) 70 - 99 mg/dL  Glucose, capillary     Status: Abnormal   Collection Time: 11/07/18  7:57 AM  Result Value Ref Range   Glucose-Capillary 306 (H) 70 - 99 mg/dL  MRSA PCR Screening     Status: None   Collection Time: 11/07/18  8:02  AM  Result Value Ref Range   MRSA by PCR NEGATIVE NEGATIVE  Troponin I - Now Then Q6H     Status: Abnormal   Collection Time: 11/07/18  9:05 AM  Result Value Ref Range   Troponin I 0.05 (HH) <0.03 ng/mL   Dg Chest Portable 1 View  Result Date: 11/07/2018 CLINICAL DATA:  Palpitations and left arm pain EXAM: PORTABLE CHEST 1 VIEW COMPARISON:  Chest radiograph 09/17/2018 FINDINGS: Unchanged position of right chest wall AICD leads. Remote median sternotomy and CABG. Mild cardiomegaly. Lungs are clear. No pleural effusion or pneumothorax. IMPRESSION: No active disease. Electronically Signed   By: Deatra Robinson M.D.   On: 11/07/2018 04:29     ASSESSMENT AND PLAN: Chronic heart failure: Code status DNR/in hospice care at home. Pt had episode of ventricular tachycardia and chest pain unrelieved with morphine at hospice center and was sent to ER. He was noted to have severe hyponatremia. Chest pain has now resolved. Advise comfort care conservative treatment. Agree with hospice plan, discharge when sodium levels are normal.  Caroleen Hamman, NP-C Cell: [phone]

## 2018-11-07 NOTE — Progress Notes (Signed)
Pt being transferred to room 119. Report called to Helmut Muster, Charity fundraiser. Pt transferred to room 119 without incident.

## 2018-11-07 NOTE — Progress Notes (Signed)
[employer] -  at [facility]     PATIENT NAME: [name]    MR#:  [id number]  DATE OF BIRTH:  [birth year]  SUBJECTIVE:   he presented to the hospital with left arm pain and palpitations and noted to have ventricular tachycardia.  Now resolved.  Patient is clinically asymptomatic.  Patient currently in a paced rhythm.  REVIEW OF SYSTEMS:    Review of Systems  Constitutional: Negative for chills and fever.  HENT: Negative for congestion and tinnitus.   Eyes: Negative for blurred vision and double vision.  Respiratory: Negative for cough, shortness of breath and wheezing.   Cardiovascular: Negative for chest pain, orthopnea and PND.  Gastrointestinal: Negative for abdominal pain, diarrhea, nausea and vomiting.  Genitourinary: Negative for dysuria and hematuria.  Neurological: Negative for dizziness, sensory change and focal weakness.  All other systems reviewed and are negative.   Nutrition: Heart Healthy/Carb control Tolerating Diet: Yes Tolerating PT: Await Eval.   DRUG ALLERGIES:   Allergies  Allergen Reactions  . Other     Intolerant to blood thinners - pt states that he has had "internal bleeding" while on blood thinners  . Captopril Other (See Comments)  . Adhesive [Tape] Other (See Comments)    "PULLS SKIN OFF" PER PT REPORT. OK TO USE PAPER TAPE.    VITALS:  Blood pressure (!) 86/60, pulse 70, temperature 98.1 F (36.7 C), temperature source Oral, resp. rate (!) 9, height 5\' 5"  (1.651 m), weight 49.4 kg, SpO2 97 %.  PHYSICAL EXAMINATION:   Physical Exam  GENERAL:  73 y.o.-year-old patient lying in bed in no acute distress.  EYES: Pupils equal, round, reactive to light and accommodation. No scleral icterus. Extraocular muscles intact.  HEENT: Head atraumatic, normocephalic. Oropharynx and nasopharynx clear.  NECK:  Supple, no jugular venous distention. No thyroid enlargement, no tenderness.  LUNGS: Normal breath sounds bilaterally, no  wheezing, rales, rhonchi. No use of accessory muscles of respiration.  CARDIOVASCULAR: S1, S2 normal. No murmurs, rubs, or gallops.  ABDOMEN: Soft, nontender, nondistended. Bowel sounds present. No organomegaly or mass.  EXTREMITIES: No cyanosis, clubbing or edema b/l.    NEUROLOGIC: Cranial nerves II through XII are intact. No focal Motor or sensory deficits b/l.   PSYCHIATRIC: The patient is alert and oriented x 3.  SKIN: No obvious rash, lesion, or ulcer.    LABORATORY PANEL:   CBC Recent Labs  Lab 11/07/18 0301  WBC 12.3*  HGB 12.4*  HCT 39.9  PLT 251   ------------------------------------------------------------------------------------------------------------------  Chemistries  Recent Labs  Lab 11/07/18 0301 11/07/18 1113  NA 121* 127*  K 4.6  --   CL 89*  --   CO2 24  --   GLUCOSE 397*  --   BUN 41*  --   CREATININE 1.98*  --   CALCIUM 9.7  --    ------------------------------------------------------------------------------------------------------------------  Cardiac Enzymes Recent Labs  Lab 11/07/18 0905  TROPONINI 0.05*   ------------------------------------------------------------------------------------------------------------------  RADIOLOGY:  Dg Chest Portable 1 View  Result Date: 11/07/2018 CLINICAL DATA:  Palpitations and left arm pain EXAM: PORTABLE CHEST 1 VIEW COMPARISON:  Chest radiograph 09/17/2018 FINDINGS: Unchanged position of right chest wall AICD leads. Remote median sternotomy and CABG. Mild cardiomegaly. Lungs are clear. No pleural effusion or pneumothorax. IMPRESSION: No active disease. Electronically Signed   By: Deatra Robinson M.D.   On: 11/07/2018 04:29     ASSESSMENT AND PLAN:   73 year old male with past medical history of diabetes, coronary disease, COPD,  CHF, history of cardiomyopathy status post AICD, chronic systolic CHF who presented to the hospital due to left arm pain and palpitations and noted to have ventricular  tachycardia.  1.  Ventricular tachycardia-this was quite episodic and patient received some amiodarone and has had no further episodes.  He is currently in a paced rhythm. - Appreciate cardiology input.  Continue supportive care.  Continue Toprol for now. -Continue mexiletine.  2.  Hyponatremia-likely hypovolemic hyponatremia.  Improved with IV fluid hydration.  We will continue to monitor.  3.  History of atrial fibrillation-rate controlled.  Continue Toprol.  Continue Eliquis.  4.  Diabetes type 2 without complication-continue sliding scale insulin.  Blood sugar stable.  5.  History of chronic systolic CHF-clinically patient is not in congestive heart failure. -Continue metoprolol, Lasix, Aldactone.  6.  Urinary incontinence-continue oxybutynin.  7.  Hyperlipidemia-continue Pravachol.  She is followed by hospice services at home.  Would likely discharge tomorrow with hospice services.  Patient can be transferred out of the ICU to telemetry now.  All the records are reviewed and case discussed with Care Management/Social Worker. Management plans discussed with the patient, family and they are in agreement.  CODE STATUS: DNR  DVT Prophylaxis: Eliquis  TOTAL TIME TAKING CARE OF THIS PATIENT: 30 minutes.   POSSIBLE D/C IN 1-2 DAYS, DEPENDING ON CLINICAL CONDITION.   Houston Siren M.D on 11/07/2018 at 1:17 PM  Between 7am to 6pm - Pager - [phone]  After 6pm go to [email or web address] - Scientist, research (life sciences)  [facility]  Office  [phone]  CC: Primary care physician; Lauro Regulus, MD

## 2018-11-07 NOTE — ED Triage Notes (Signed)
Pt arrived to the ED via EMS from home for complaints of palpitations and left arm pain. EMS was called per Hospice nurse which gave the Pt 20mg  of morphine, nitro x2, 0.5 of Ativan and an extra metropolol.Pt is a hospice/DNR Pt, AOx4 in no apparent distress upon arrival to the ED, Dr. Derrill Kay at bedside.

## 2018-11-07 NOTE — H&P (Addendum)
[name] is an 73 y.o. male.   Chief Complaint: Chest pain HPI: The patient with past medical history of CHF, coronary artery disease status post CABG, hypertension and pacemaker placement presents to the emergency department complaining of chest pain.  The patient states that his pain began with palpitations that began while sitting in his wheelchair.  He thought they might subside with rest so he tried lying down but developed chest pain that radiated into his arm.  The patient has had multiple heart attacks in the past which were accompanied with similar symptoms.  Thus he called EMS to be evaluated in the emergency department where he was found to have a wide-complex tachycardia.  An amiodarone bolus was administered which decreases heart rate from 130 to approximately 114.  The patient's pacemaker resumed function and the patient was chest pain-free.  However he remained hypotensive which prompted the emergency department staff call hospitalist service for further judgment.  Past Medical History:  Diagnosis Date  . AICD (automatic cardioverter/defibrillator) present 2010   battery changed out 2015  . Anginal pain (HCC)    infrequent pain. usually feels as pain in the arms  . Bilateral swelling of feet   . CHF (congestive heart failure) (HCC)   . Chronic cough   . Chronic kidney disease    renal insufficiency.started on spironalactone  . COPD (chronic obstructive pulmonary disease) (HCC)    does not use o2  . Coronary artery disease   . Diabetes mellitus without complication (HCC)   . Dysrhythmia   . GERD (gastroesophageal reflux disease)   . Hepatitis   . Hypercholesteremia   . Hypertension    history of,   . Hyponatremia   . Myocardial infarction (HCC) 1993   several. per patient, has had 10 heart attacks  . Pancreatic pseudocyst   . Pneumonia   . Presence of permanent cardiac pacemaker    and defibrilator    Past Surgical History:  Procedure Laterality Date  . CARDIAC  CATHETERIZATION     with stents  . CARDIAC DEFIBRILLATOR PLACEMENT  2010  . CATARACT EXTRACTION W/PHACO Left 02/23/2016   Procedure: CATARACT EXTRACTION PHACO AND INTRAOCULAR LENS PLACEMENT (IOC);  Surgeon: Galen Manila, MD;  Location: [facility] ORS;  Service: Ophthalmology;  Laterality: Left;  [place] 1.05 AP% 22.9 CDE 15.02 Fluid pack lot # [id number]  . CHOLECYSTECTOMY  2014  . CORONARY ARTERY BYPASS GRAFT    . ERCP W/ SPHICTEROTOMY    . ESOPHAGOGASTRODUODENOSCOPY (EGD) WITH PROPOFOL N/A 04/24/2018   Procedure: ESOPHAGOGASTRODUODENOSCOPY (EGD) WITH PROPOFOL;  Surgeon: Toledo, Boykin Nearing, MD;  Location: [facility] ENDOSCOPY;  Service: Gastroenterology;  Laterality: N/A;  . feeding tube removal  2014  . GASTROSTOMY W/ FEEDING TUBE  2014   after galbladder surgery.  . LOWER EXTREMITY ANGIOGRAPHY Left 04/26/2017   Procedure: Lower Extremity Angiography;  Surgeon: Annice Needy, MD;  Location: [facility] INVASIVE CV LAB;  Service: Cardiovascular;  Laterality: Left;  . LOWER EXTREMITY ANGIOGRAPHY Right 05/25/2017   Procedure: Lower Extremity Angiography;  Surgeon: Annice Needy, MD;  Location: [facility] INV[facility] CV [facility];  Service: Cardiovascular;  Laterality: Right;  . PANCREATIC CYST EXCISION  2014   had hepatitis at this time  . STENT PLACEMENT NON-VASCULAR ([facility] HX)     cardiac stents x many    Family History  Problem Relation Age of Onset  . Heart attack Mother   . Cancer Brother   . CAD Brother    Social History:  reports that he  has been smoking. He has a 55.00 pack-year smoking history. He has never used smokeless tobacco. He reports that he does not drink alcohol or use drugs.  Allergies:  Allergies  Allergen Reactions  . Other     Intolerant to blood thinners - pt states that he has had "internal bleeding" while on blood thinners  . Captopril Other (See Comments)  . Adhesive [Tape] Other (See Comments)    "PULLS SKIN OFF" PER PT REPORT. OK TO USE PAPER TAPE.    Medications Prior to Admission   Medication Sig Dispense Refill  . acetaminophen (TYLENOL) 325 MG tablet Take 325-650 mg by mouth every 4 (four) hours as needed for mild pain or fever.    Marland Kitchen albuterol (PROVENTIL) (2.5 MG/3ML) 0.083% nebulizer solution Take 2.5 mg by nebulization as needed for wheezing.    Marland Kitchen apixaban (ELIQUIS) 2.5 MG TABS tablet Take 2.5 mg by mouth daily.    . furosemide (LASIX) 40 MG tablet Take 40 mg by mouth daily.     . insulin regular (NOVOLIN R,HUMULIN R) 100 units/mL injection Inject 18 Units into the skin 2 (two) times daily before a meal.    . LORazepam (ATIVAN) 0.5 MG tablet Take 0.5 mg by mouth every 4 (four) hours as needed for anxiety.    . metoprolol tartrate (LOPRESSOR) 50 MG tablet Take 50 mg by mouth 2 (two) times daily.    Marland Kitchen mexiletine (MEXITIL) 150 MG capsule Take 150 mg by mouth 2 (two) times daily.    Marland Kitchen morphine (ROXANOL) 20 MG/ML concentrated solution Take 10 mg by mouth every hour as needed for severe pain.    . Multiple Vitamin (MULTIVITAMIN WITH MINERALS) TABS tablet Take 1 tablet by mouth daily.    . nitroGLYCERIN (NITROSTAT) 0.4 MG SL tablet Place 0.4 mg under the tongue every 5 (five) minutes as needed for chest pain.    Marland Kitchen ondansetron (ZOFRAN) 8 MG tablet Take 8 mg by mouth every 8 (eight) hours as needed for nausea.    Marland Kitchen oxybutynin (DITROPAN) 5 MG tablet Take 5 mg by mouth 2 (two) times daily.    . pravastatin (PRAVACHOL) 40 MG tablet Take 40 mg by mouth at bedtime.    Marland Kitchen spironolactone (ALDACTONE) 25 MG tablet Take 50 mg by mouth daily.     . vitamin B-12 (CYANOCOBALAMIN) 500 MCG tablet Take 500 mcg by mouth daily.     Marland Kitchen amiodarone (PACERONE) 400 MG tablet Take 1 tablet (400 mg total) by mouth daily. (Patient not taking: Reported on 11/07/2018) 20 tablet 0  . guaiFENesin-dextromethorphan (ROBITUSSIN DM) 100-10 MG/5ML syrup Take 5 mLs by mouth every 4 (four) hours as needed for cough. (Patient not taking: Reported on 11/07/2018) 118 mL 0  . insulin NPH Human (HUMULIN N,NOVOLIN N) 100  UNIT/ML injection Inject 0.18 mLs (18 Units total) into the skin 2 (two) times daily before a meal. (Patient not taking: Reported on 11/07/2018) 10 mL 11  . nitroGLYCERIN (NITRODUR - DOSED IN MG/24 HR) 0.1 mg/hr patch Place 1 patch (0.1 mg total) onto the skin daily as needed. (Patient not taking: Reported on 11/07/2018) 30 patch 1    Results for orders placed or performed during the hospital encounter of 11/07/18 (from the past 48 hour(s))  CBC with Differential     Status: Abnormal   Collection Time: 11/07/18  3:01 AM  Result Value Ref Range   WBC 12.3 (H) 4.0 - 10.5 K/uL   RBC 5.13 4.22 - 5.81 MIL/uL   Hemoglobin  12.4 (L) 13.0 - 17.0 g/dL   HCT 73.5 32.9 - 92.4 %   MCV 77.8 (L) 80.0 - 100.0 fL   MCH 24.2 (L) 26.0 - 34.0 pg   MCHC 31.1 30.0 - 36.0 g/dL   RDW 26.8 (H) 34.1 - 96.2 %   Platelets 251 150 - 400 K/uL   nRBC 0.0 0.0 - 0.2 %   Neutrophils Relative % 67 %   Neutro Abs 8.4 (H) 1.7 - 7.7 K/uL   Lymphocytes Relative 22 %   Lymphs Abs 2.7 0.7 - 4.0 K/uL   Monocytes Relative 8 %   Monocytes Absolute 1.0 0.1 - 1.0 K/uL   Eosinophils Relative 1 %   Eosinophils Absolute 0.1 0.0 - 0.5 K/uL   Basophils Relative 1 %   Basophils Absolute 0.1 0.0 - 0.1 K/uL   Immature Granulocytes 1 %   Abs Immature Granulocytes 0.07 0.00 - 0.07 K/uL    Comment: Performed at [facility], [address] [place], [place] [address]  Basic metabolic panel     Status: Abnormal   Collection Time: 11/07/18  3:01 AM  Result Value Ref Range   Sodium 121 (L) 135 - 145 mmol/L   Potassium 4.6 3.5 - 5.1 mmol/L   Chloride 89 (L) 98 - 111 mmol/L   CO2 24 22 - 32 mmol/L   Glucose, Bld 397 (H) 70 - 99 mg/dL   BUN 41 (H) 8 - 23 mg/dL   Creatinine, Ser 8.92 (H) 0.61 - 1.24 mg/dL   Calcium 9.7 8.9 - 11.9 mg/dL   GFR calc non Af Amer 32 (L) >60 mL/min   GFR calc Af Amer 37 (L) >60 mL/min   Anion gap 8 5 - 15    Comment: Performed at [facility], [address] [place], [place] [address]   Troponin I - ONCE - STAT     Status: Abnormal   Collection Time: 11/07/18  3:01 AM  Result Value Ref Range   Troponin I 0.04 (HH) <0.03 ng/mL    Comment: CRITICAL RESULT CALLED TO, READ BACK BY AND VERIFIED WITH Welch RIVERIA AT 0410 11/07/2018 SMA Performed at [facility], [address] [place], [place] [address]   Urinalysis, Complete w Microscopic     Status: Abnormal   Collection Time: 11/07/18  4:31 AM  Result Value Ref Range   Color, Urine YELLOW (A) YELLOW   APPearance CLOUDY (A) CLEAR   Specific Gravity, Urine 1.013 1.005 - 1.030   pH 6.0 5.0 - 8.0   Glucose, UA >=500 (A) NEGATIVE mg/dL   Hgb urine dipstick LARGE (A) NEGATIVE   Bilirubin Urine NEGATIVE NEGATIVE   Ketones, ur NEGATIVE NEGATIVE mg/dL   Protein, ur 185 (A) NEGATIVE mg/dL   Nitrite NEGATIVE NEGATIVE   Leukocytes,Ua LARGE (A) NEGATIVE   RBC / HPF >50 (H) 0 - 5 RBC/hpf   WBC, UA >50 (H) 0 - 5 WBC/hpf   Bacteria, UA RARE (A) NONE SEEN   Squamous Epithelial / LPF 0-5 0 - 5   WBC Clumps PRESENT    Non Squamous Epithelial PRESENT (A) NONE SEEN    Comment: Performed at [facility], [address] [place], [place] [address]   Dg Chest Portable 1 View  Result Date: 11/07/2018 CLINICAL DATA:  Palpitations and left arm pain EXAM: PORTABLE CHEST 1 VIEW COMPARISON:  Chest radiograph 09/17/2018 FINDINGS: Unchanged position of right chest wall AICD leads. Remote median sternotomy and CABG. Mild cardiomegaly. Lungs are clear.  No pleural effusion or pneumothorax. IMPRESSION: No active disease. Electronically Signed   By: Deatra Robinson M.D.   On: 11/07/2018 04:29    Review of Systems  Constitutional: Negative for chills and fever.  HENT: Negative for sore throat and tinnitus.   Eyes: Negative for blurred vision and redness.  Respiratory: Negative for cough and shortness of breath.   Cardiovascular: Positive for chest pain and palpitations. Negative for orthopnea and PND.  Gastrointestinal:  Negative for abdominal pain, diarrhea, nausea and vomiting.  Genitourinary: Negative for dysuria, frequency and urgency.  Musculoskeletal: Negative for joint pain and myalgias.  Skin: Negative for rash.       No lesions  Neurological: Negative for speech change, focal weakness and weakness.  Endo/Heme/Allergies: Does not bruise/bleed easily.       No temperature intolerance  Psychiatric/Behavioral: Negative for depression and suicidal ideas.    Blood pressure (!) 84/71, pulse (!) 116, temperature (!) 97.5 F (36.4 C), temperature source Oral, resp. rate (!) 21, height 5\' 5"  (1.651 m), weight 49.4 kg, SpO2 99 %. Physical Exam  Vitals reviewed. Constitutional: He is oriented to person, place, and time. He appears well-developed and well-nourished. No distress.  HENT:  Head: Normocephalic and atraumatic.  Mouth/Throat: Oropharynx is clear and moist.  Eyes: Pupils are equal, round, and reactive to light. Conjunctivae and EOM are normal. No scleral icterus.  Neck: Neck supple. No JVD present. No tracheal deviation present. No thyromegaly present.  Cardiovascular: Normal rate, regular rhythm and normal heart sounds. Exam reveals no gallop and no friction rub.  No murmur heard. Respiratory: Effort normal and breath sounds normal. No respiratory distress.  GI: Soft. Bowel sounds are normal. He exhibits no distension. There is no abdominal tenderness.  Genitourinary:    Genitourinary Comments: Deferred   Musculoskeletal: Normal range of motion.        General: No edema.  Lymphadenopathy:    He has no cervical adenopathy.  Neurological: He is alert and oriented to person, place, and time.  Skin: Skin is warm and dry. No rash noted. No erythema.  Psychiatric: He has a normal mood and affect. His behavior is normal. Judgment and thought content normal.     Assessment/Plan This is a 73 year old male admitted for ventricular tachycardia. 1.  Wide-complex tachycardia: Stable V. tach; rate  improved with IV amiodarone.  This is happened before.  Consider resuming oral maintenance dose of amiodarone.  Mild elevation in troponin is expected.  Continue mexiletine and metoprolol.  Consult cardiology for further recommendations.  Continue to monitor telemetry 2.  CHF: Systolic; chronic, with associated hyponatremia.  No pulmonary edema or lower extremity swelling.  The patient is hypotensive but perfusing well and is mentally alert. 3.  Acute kidney injury: Superimposed upon chronic kidney disease.  Avoid nephrotoxic agents.  Decide on Lasix and spironolactone dosing once have recommendations from cardiology. 4.  Diabetes mellitus type 2: Sliding scale insulin 5.  DVT prophylaxis: Eliquis 6.  GI prophylaxis: None The patient is a full code.  Time spent on admission orders.  Patient care approximately 45 minutes  Arnaldo Natal, MD 11/07/2018, 5:24 AM

## 2018-11-08 LAB — GLUCOSE, CAPILLARY: Glucose-Capillary: 202 mg/dL — ABNORMAL HIGH (ref 70–99)

## 2018-11-08 NOTE — Progress Notes (Signed)
SUBJECTIVE: Pt is feeling well, no chest pain or shortness of breath. He sates he is comfortable and eager to go home.   Vitals:   11/07/18 1400 11/07/18 1500 11/07/18 1940 11/08/18 0256  BP: 104/66 (!) 89/55 (!) 86/60 91/62  Pulse: 71  70 70  Resp: 19  18 20   Temp:  (!) 97.5 F (36.4 C) (!) 97.5 F (36.4 C) 97.7 F (36.5 C)  TempSrc:  Oral Oral Oral  SpO2: 100%  99% 97%  Weight:      Height:        Intake/Output Summary (Last 24 hours) at 11/08/2018 0856 Last data filed at 11/08/2018 0302 Gross per 24 hour  Intake 830 ml  Output 1375 ml  Net -545 ml    LABS: Basic Metabolic Panel: Recent Labs    11/07/18 0301 11/07/18 1113  NA 121* 127*  K 4.6  --   CL 89*  --   CO2 24  --   GLUCOSE 397*  --   BUN 41*  --   CREATININE 1.98*  --   CALCIUM 9.7  --    Liver Function Tests: No results for input(s): AST, ALT, ALKPHOS, BILITOT, PROT, ALBUMIN in the last 72 hours. No results for input(s): LIPASE, AMYLASE in the last 72 hours. CBC: Recent Labs    11/07/18 0301  WBC 12.3*  NEUTROABS 8.4*  HGB 12.4*  HCT 39.9  MCV 77.8*  PLT 251   Cardiac Enzymes: Recent Labs    11/07/18 0905 11/07/18 1502 11/07/18 2044  TROPONINI 0.05* 0.05* 0.05*   BNP: Invalid input(s): POCBNP D-Dimer: No results for input(s): DDIMER in the last 72 hours. Hemoglobin A1C: No results for input(s): HGBA1C in the last 72 hours. Fasting Lipid Panel: No results for input(s): CHOL, HDL, LDLCALC, TRIG, CHOLHDL, LDLDIRECT in the last 72 hours. Thyroid Function Tests: Recent Labs    11/07/18 0301  TSH 22.323*   Anemia Panel: No results for input(s): VITAMINB12, FOLATE, FERRITIN, TIBC, IRON, RETICCTPCT in the last 72 hours.   PHYSICAL EXAM General: Pale weak, frail HEENT:  Normocephalic and atramatic Neck:  No JVD.  Lungs: Clear bilaterally to auscultation and percussion. Heart: HRRR . Normal S1 and S2 without gallops or murmurs.  Abdomen: Bowel sounds are positive, abdomen soft and  non-tender  Msk: frail Extremities: No clubbing, cyanosis or edema.   Neuro: lethargic  TELEMETRY: [id number]  ASSESSMENT AND PLAN: Ventricular tachycardia: Pt had vtach, rate now controlled with pacemaker. He is in home hospice care and DNR, advise comfort care and discharge home.   Active Problems:   Ventricular tachycardia (HCC)    Caroleen Hamman, NP-C 11/08/2018 8:56 AM Cell: [phone]

## 2018-11-08 NOTE — Progress Notes (Signed)
Attempted to call wife x2 regarding patient discharge with no answer. Patient stating he spoke to her. Patient discharged via EMS at this time. Foley in place. Bo Mcclintock, RN

## 2018-11-08 NOTE — Discharge Summary (Signed)
[employer] - [place] [facility]   PATIENT NAME: [name]    MR#:  [id number]  DATE OF BIRTH:  12/07/1945  DATE OF ADMISSION:  11/07/2018 ADMITTING PHYSICIAN: Arnaldo Natal, MD  DATE OF DISCHARGE: 11/08/2018 12:05 PM  PRIMARY CARE PHYSICIAN: Lauro Regulus, MD    ADMISSION DIAGNOSIS:  Wide-complex tachycardia (HCC) [I47.2] Left arm pain [M79.602]  DISCHARGE DIAGNOSIS:  Active Problems:   Ventricular tachycardia (HCC)   SECONDARY DIAGNOSIS:   Past Medical History:  Diagnosis Date  . AICD (automatic cardioverter/defibrillator) present 2010   battery changed out 2015  . Anginal pain (HCC)    infrequent pain. usually feels as pain in the arms  . Bilateral swelling of feet   . CHF (congestive heart failure) (HCC)   . Chronic cough   . Chronic kidney disease    renal insufficiency.started on spironalactone  . COPD (chronic obstructive pulmonary disease) (HCC)    does not use o2  . Coronary artery disease   . Diabetes mellitus without complication (HCC)   . Dysrhythmia   . GERD (gastroesophageal reflux disease)   . Hepatitis   . Hypercholesteremia   . Hypertension    history of,   . Hyponatremia   . Myocardial infarction (HCC) 1993   several. per patient, has had 10 heart attacks  . Pancreatic pseudocyst   . Pneumonia   . Presence of permanent cardiac pacemaker    and defibrilator    HOSPITAL COURSE:   73 year old male with past medical history of diabetes, coronary disease, COPD, CHF, history of cardiomyopathy status post AICD, chronic systolic CHF who presented to the hospital due to left arm pain and palpitations and noted to have ventricular tachycardia.  1.  Ventricular tachycardia- this was quite episodic and patient received some amiodarone and has had no further episodes.   Patient was seen by cardiology and the recommended supportive care and continuing his Toprol and mexiletine, patient's telemetry shows no further evidence  of VT overnight being discharged back home with hospice services.  2.  Hyponatremia-likely hypovolemic hyponatremia.   -Improved and resolved with IV fluid hydration.  3.  History of atrial fibrillation-rate controlled.  pt will Continue Toprol.  Continue Eliquis.  4.  Diabetes type 2 without complication- patient will resume her insulin as stated below.  Blood sugars remained stable while in the hospital.  5.  History of chronic systolic CHF-clinically patient was not in congestive heart failure. -pt. Will Continue metoprolol, Lasix, Aldactone.  6.  Urinary incontinence-pt will continue oxybutynin.  7.  Hyperlipidemia- pt.will continue Pravachol.  DISCHARGE CONDITIONS:   Stable.   CONSULTS OBTAINED:  Treatment Team:  Laurier Nancy, MD  DRUG ALLERGIES:   Allergies  Allergen Reactions  . Other     Intolerant to blood thinners - pt states that he has had "internal bleeding" while on blood thinners  . Captopril Other (See Comments)  . Adhesive [Tape] Other (See Comments)    "PULLS SKIN OFF" PER PT REPORT. OK TO USE PAPER TAPE.    DISCHARGE MEDICATIONS:   Allergies as of 11/08/2018      Reactions   Other    Intolerant to blood thinners - pt states that he has had "internal bleeding" while on blood thinners   Captopril Other (See Comments)   Adhesive [tape] Other (See Comments)   "PULLS SKIN OFF" PER PT REPORT. OK TO USE PAPER TAPE.      Medication List    STOP taking these  medications   amiodarone 400 MG tablet Commonly known as:  PACERONE   guaiFENesin-dextromethorphan 100-10 MG/5ML syrup Commonly known as:  ROBITUSSIN DM   insulin NPH Human 100 UNIT/ML injection Commonly known as:  HUMULIN N,NOVOLIN N     TAKE these medications   acetaminophen 325 MG tablet Commonly known as:  TYLENOL Take 325-650 mg by mouth every 4 (four) hours as needed for mild pain or fever.   albuterol (2.5 MG/3ML) 0.083% nebulizer solution Commonly known as:   PROVENTIL Take 2.5 mg by nebulization as needed for wheezing.   Eliquis 2.5 MG Tabs tablet Generic drug:  apixaban Take 2.5 mg by mouth daily.   furosemide 40 MG tablet Commonly known as:  LASIX Take 40 mg by mouth daily.   insulin regular 100 units/mL injection Commonly known as:  NOVOLIN R,HUMULIN R Inject 18 Units into the skin 2 (two) times daily before a meal.   LORazepam 0.5 MG tablet Commonly known as:  ATIVAN Take 0.5 mg by mouth every 4 (four) hours as needed for anxiety.   metoprolol tartrate 50 MG tablet Commonly known as:  LOPRESSOR Take 50 mg by mouth 2 (two) times daily.   mexiletine 150 MG capsule Commonly known as:  MEXITIL Take 150 mg by mouth 2 (two) times daily.   morphine 20 MG/ML concentrated solution Commonly known as:  ROXANOL Take 10 mg by mouth every hour as needed for severe pain.   multivitamin with minerals Tabs tablet Take 1 tablet by mouth daily.   nitroGLYCERIN 0.4 MG SL tablet Commonly known as:  NITROSTAT Place 0.4 mg under the tongue every 5 (five) minutes as needed for chest pain. What changed:  Another medication with the same name was removed. Continue taking this medication, and follow the directions you see here.   ondansetron 8 MG tablet Commonly known as:  ZOFRAN Take 8 mg by mouth every 8 (eight) hours as needed for nausea.   oxybutynin 5 MG tablet Commonly known as:  DITROPAN Take 5 mg by mouth 2 (two) times daily.   pravastatin 40 MG tablet Commonly known as:  PRAVACHOL Take 40 mg by mouth at bedtime.   spironolactone 25 MG tablet Commonly known as:  ALDACTONE Take 50 mg by mouth daily.   vitamin B-12 500 MCG tablet Commonly known as:  CYANOCOBALAMIN Take 500 mcg by mouth daily.         DISCHARGE INSTRUCTIONS:   DIET:  Cardiac diet and Diabetic diet  DISCHARGE CONDITION:  Stable  ACTIVITY:  Activity as tolerated  OXYGEN:  Home Oxygen: No.   Oxygen Delivery: room air  DISCHARGE LOCATION:  Home  with Hospice   If you experience worsening of your admission symptoms, develop shortness of breath, life threatening emergency, suicidal or homicidal thoughts you must seek medical attention immediately by calling 911 or calling your MD immediately  if symptoms less severe.  You Must read complete instructions/literature along with all the possible adverse reactions/side effects for all the Medicines you take and that have been prescribed to you. Take any new Medicines after you have completely understood and accpet all the possible adverse reactions/side effects.   Please note  You were cared for by a hospitalist during your hospital stay. If you have any questions about your discharge medications or the care you received while you were in the hospital after you are discharged, you can call the unit and asked to speak with the hospitalist on call if the hospitalist that took care of  you is not available. Once you are discharged, your primary care physician will handle any further medical issues. Please note that NO REFILLS for any discharge medications will be authorized once you are discharged, as it is imperative that you return to your primary care physician (or establish a relationship with a primary care physician if you do not have one) for your aftercare needs so that they can reassess your need for medications and monitor your lab values.     Today   No acute complaints presently, no episodes of VT overnight.  Will discharge home with hospice services today.  VITAL SIGNS:  Blood pressure 97/66, pulse 70, temperature 98.2 F (36.8 C), temperature source Oral, resp. rate 20, height 5\' 5"  (1.651 m), weight 49.4 kg, SpO2 100 %.  I/O:    Intake/Output Summary (Last 24 hours) at 11/08/2018 1543 Last data filed at 11/08/2018 1000 Gross per 24 hour  Intake 80 ml  Output 1250 ml  Net -1170 ml    PHYSICAL EXAMINATION:  GENERAL:  73 y.o.-year-old patient lying in the bed with no acute  distress.  EYES: Pupils equal, round, reactive to light and accommodation. No scleral icterus. Extraocular muscles intact.  HEENT: Head atraumatic, normocephalic. Oropharynx and nasopharynx clear.  NECK:  Supple, no jugular venous distention. No thyroid enlargement, no tenderness.  LUNGS: Normal breath sounds bilaterally, no wheezing, rales,rhonchi. No use of accessory muscles of respiration.  CARDIOVASCULAR: S1, S2 normal. No murmurs, rubs, or gallops.  ABDOMEN: Soft, non-tender, non-distended. Bowel sounds present. No organomegaly or mass.  EXTREMITIES: No pedal edema, cyanosis, or clubbing.  NEUROLOGIC: Cranial nerves II through XII are intact. No focal motor or sensory defecits b/l.  PSYCHIATRIC: The patient is alert and oriented x 3.  SKIN: No obvious rash, lesion, or ulcer.   Chronic Foley catheter in place with yellow urine draining.  DATA REVIEW:   CBC Recent Labs  Lab 11/07/18 0301  WBC 12.3*  HGB 12.4*  HCT 39.9  PLT 251    Chemistries  Recent Labs  Lab 11/07/18 0301 11/07/18 1113  NA 121* 127*  K 4.6  --   CL 89*  --   CO2 24  --   GLUCOSE 397*  --   BUN 41*  --   CREATININE 1.98*  --   CALCIUM 9.7  --     Cardiac Enzymes Recent Labs  Lab 11/07/18 2044  TROPONINI 0.05*    Microbiology Results  Results for orders placed or performed during the hospital encounter of 11/07/18  MRSA PCR Screening     Status: None   Collection Time: 11/07/18  8:02 AM  Result Value Ref Range Status   MRSA by PCR NEGATIVE NEGATIVE Final    Comment:        The GeneXpert MRSA Assay (FDA approved for NASAL specimens only), is one component of a comprehensive MRSA colonization surveillance program. It is not intended to diagnose MRSA infection nor to guide or monitor treatment for MRSA infections. Performed at [facility], [address] [place], [place] [address]     RADIOLOGY:  Dg Chest Portable 1 View  Result Date: 11/07/2018 CLINICAL DATA:   Palpitations and left arm pain EXAM: PORTABLE CHEST 1 VIEW COMPARISON:  Chest radiograph 09/17/2018 FINDINGS: Unchanged position of right chest wall AICD leads. Remote median sternotomy and CABG. Mild cardiomegaly. Lungs are clear. No pleural effusion or pneumothorax. IMPRESSION: No active disease. Electronically Signed   By: Deatra Robinson M.D.   On: 11/07/2018  04:29      Management plans discussed with the patient, family and they are in agreement.  CODE STATUS:  Code Status History    Date Active Date Inactive Code Status Order ID Comments User Context   11/07/2018 0950 11/08/2018 1510 DNR [id number]  Erin Fulling, MD Inpatient  Questions for Most Recent Historical Code Status (Order [id number])    Question Answer Comment   In the event of cardiac or respiratory ARREST Do not call a "code blue"    In the event of cardiac or respiratory ARREST Do not perform Intubation, CPR, defibrillation or ACLS    In the event of cardiac or respiratory ARREST Use medication by any route, position, wound care, and other measures to relive pain and suffering. May use oxygen, suction and manual treatment of airway obstruction as needed for comfort.        TOTAL TIME TAKING CARE OF THIS PATIENT: 40 minutes.    Houston Siren M.D on 11/08/2018 at 3:43 PM  Between 7am to 6pm - Pager - [phone]  After 6pm go to [email or web address] - Scientist, research (life sciences)  Hospitalists  Office  [phone]  CC: Primary care physician; Lauro Regulus, MD

## 2018-11-08 NOTE — Progress Notes (Signed)
EMS called for transport to home address. Travas Schexnayder S, RN  

## 2018-11-08 NOTE — TOC Transition Note (Signed)
Transition of Care [facility]) - CM/SW Discharge Note   Patient Details  Name: [name] MRN: [id number] Date of Birth: 19-Nov-1945  [facility] CM/SW Contact:  Gwenette Greet, RN Phone Number: 11/08/2018, 9:15 AM   Clinical Narrative:   Discharge to home today per Dr. Cherlynn Kaiser. Will be followed by [facility]. Will update Dayna Barker, RN representative for [employer].  Transportation will be arranged per [employer].     Final next level of care: Home w Hospice Care Barriers to Discharge: Barriers Resolved   Patient Goals and CMS Choice Patient states their goals for this hospitalization and ongoing recovery are:: (Ready to go home. ) CMS Medicare.gov Compare Post Acute Care list provided to:: Patient Choice offered to / list presented to : NA  Discharge Placement Home                        Discharge Plan and Services [facility]                          Social Determinants of Health (SDOH) Interventions Lives with wife, [name].      Readmission Risk Interventions No flowsheet data found.

## 2018-11-08 NOTE — Progress Notes (Signed)
Patient was being picked up by EMS upon writers arrival. Hospice team alerted by telephone to discharge. Discharge summary to be faxed to triage when posted.  Dayna Barker BSN, RN, [facility] hospice [phone]

## 2018-12-14 DEATH — deceased

## 2020-07-04 IMAGING — DX DG CHEST 1V PORT
1 series · 1 of 1 positions shown · non-contrast
Comparison: March 09, 2018

CLINICAL DATA: Possible seizure.

EXAM:
PORTABLE CHEST 1 VIEW

[chest ap]
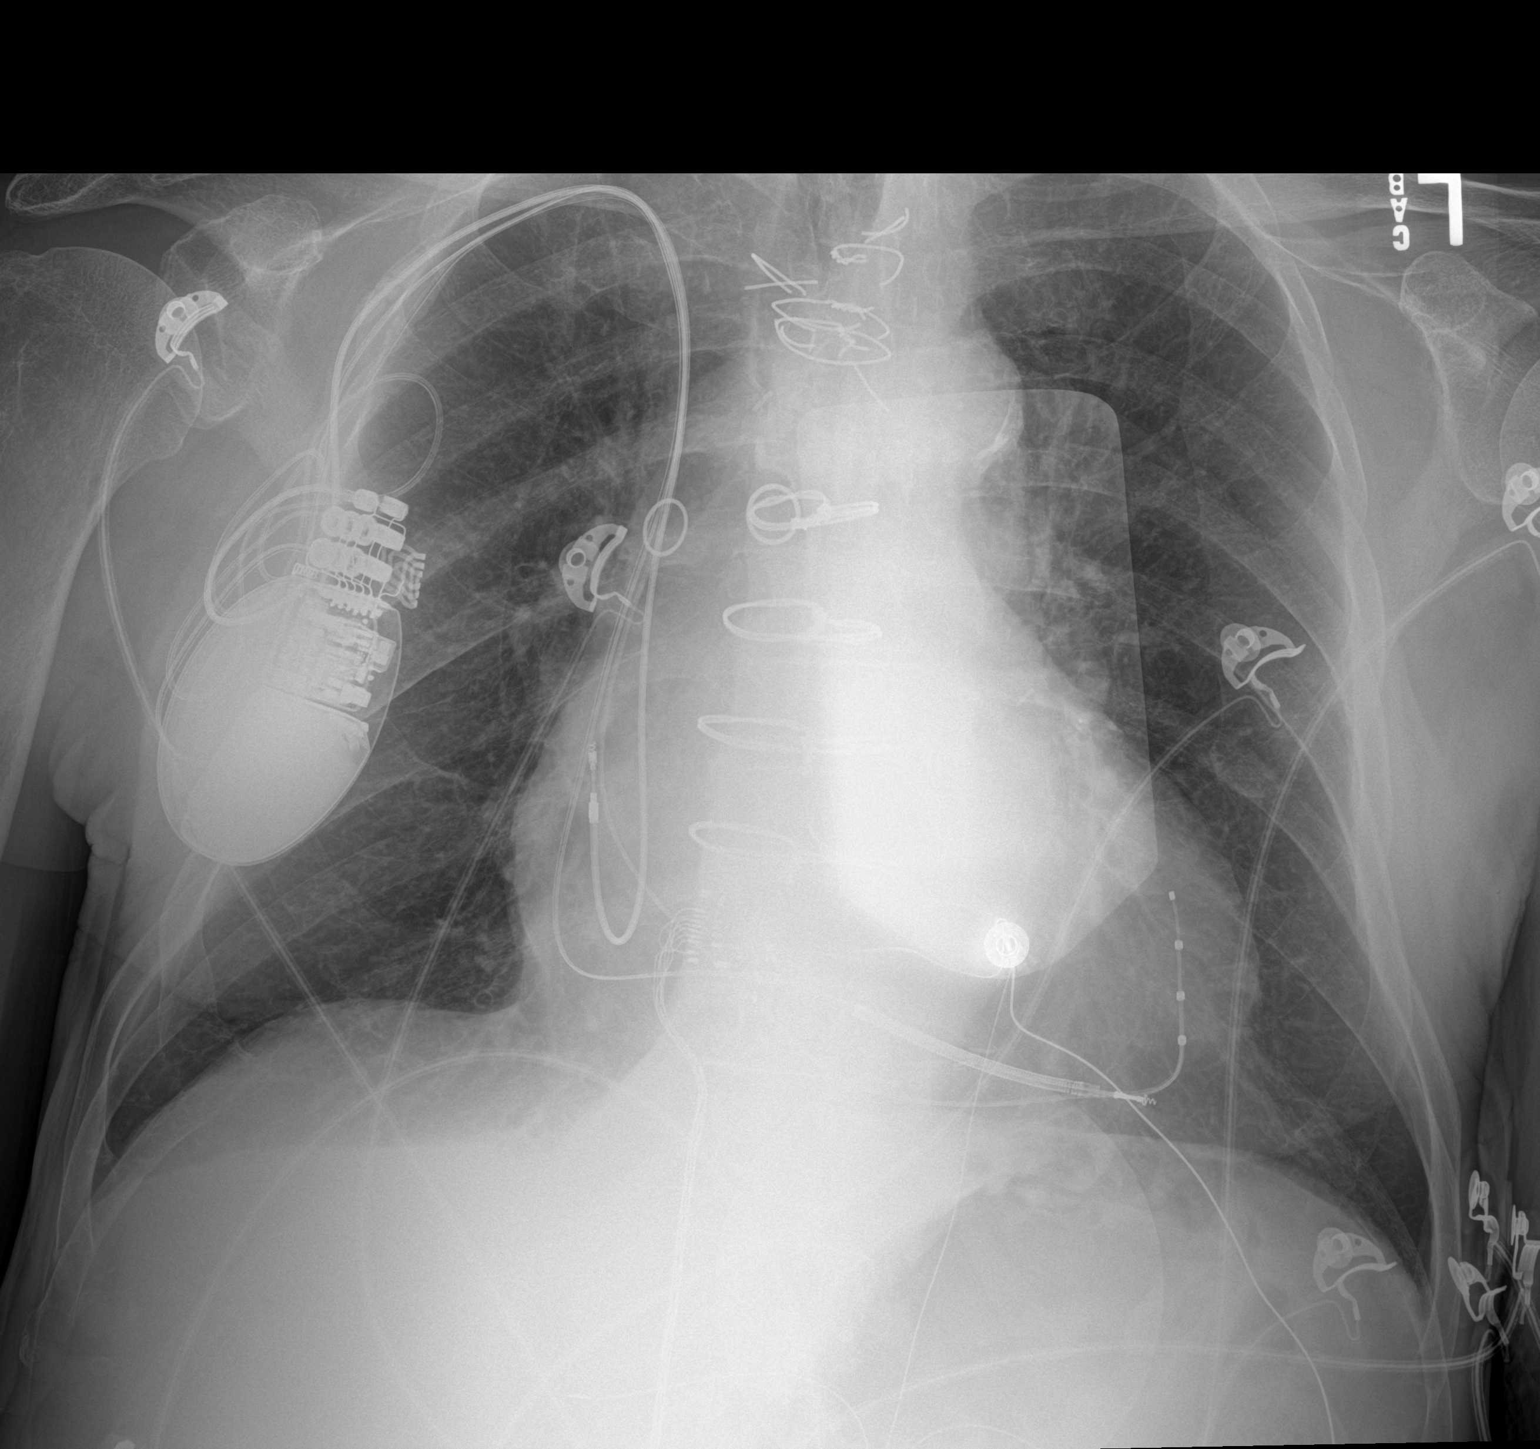

[1 of 1 positions shown; findings below may reference images not displayed]

FINDINGS: Stable AICD device. Stable cardiomegaly. The hila, mediastinum,
lungs, and pleura are otherwise unchanged and unremarkable.
IMPRESSION: No active disease.
